# Patient Record
Sex: Female | Born: 1960
Health system: Southern US, Community
[De-identification: ages and names within clinical notes are randomized; demographics above are authoritative.]

## PROBLEM LIST (undated history)

## (undated) DIAGNOSIS — R7302 Impaired glucose tolerance (oral): Secondary | ICD-10-CM

## (undated) DIAGNOSIS — G4700 Insomnia, unspecified: Secondary | ICD-10-CM

## (undated) DIAGNOSIS — F4389 Other reactions to severe stress: Secondary | ICD-10-CM

## (undated) DIAGNOSIS — I10 Essential (primary) hypertension: Secondary | ICD-10-CM

## (undated) DIAGNOSIS — R197 Diarrhea, unspecified: Secondary | ICD-10-CM

## (undated) DIAGNOSIS — J45909 Unspecified asthma, uncomplicated: Secondary | ICD-10-CM

## (undated) DIAGNOSIS — J309 Allergic rhinitis, unspecified: Secondary | ICD-10-CM

## (undated) DIAGNOSIS — M8430XA Stress fracture, unspecified site, initial encounter for fracture: Secondary | ICD-10-CM

## (undated) DIAGNOSIS — G43009 Migraine without aura, not intractable, without status migrainosus: Secondary | ICD-10-CM

## (undated) DIAGNOSIS — M1990 Unspecified osteoarthritis, unspecified site: Secondary | ICD-10-CM

## (undated) DIAGNOSIS — F438 Other reactions to severe stress: Secondary | ICD-10-CM

## (undated) HISTORY — DX: Allergic rhinitis, unspecified: J30.9

## (undated) HISTORY — DX: Diarrhea, unspecified: R19.7

## (undated) HISTORY — DX: Migraine without aura, not intractable, without status migrainosus: G43.009

## (undated) HISTORY — DX: Other reactions to severe stress: F43.89

## (undated) HISTORY — DX: Insomnia, unspecified: G47.00

## (undated) HISTORY — DX: Unspecified asthma, uncomplicated: J45.909

## (undated) HISTORY — DX: Other reactions to severe stress: F43.8

## (undated) HISTORY — DX: Impaired glucose tolerance (oral): R73.02

---

## 2005-07-21 ENCOUNTER — Ambulatory Visit: Payer: Self-pay | Admitting: Internal Medicine

## 2006-04-03 ENCOUNTER — Ambulatory Visit: Payer: Self-pay | Admitting: Internal Medicine

## 2006-05-29 ENCOUNTER — Ambulatory Visit: Payer: Self-pay | Admitting: Internal Medicine

## 2006-09-11 ENCOUNTER — Ambulatory Visit: Payer: Self-pay | Admitting: Internal Medicine

## 2007-02-27 ENCOUNTER — Ambulatory Visit: Payer: Self-pay | Admitting: Internal Medicine

## 2007-03-04 ENCOUNTER — Encounter: Payer: Self-pay | Admitting: Internal Medicine

## 2007-03-04 DIAGNOSIS — J45909 Unspecified asthma, uncomplicated: Secondary | ICD-10-CM | POA: Insufficient documentation

## 2007-05-21 ENCOUNTER — Ambulatory Visit: Payer: Self-pay | Admitting: Internal Medicine

## 2007-05-21 DIAGNOSIS — J45901 Unspecified asthma with (acute) exacerbation: Secondary | ICD-10-CM | POA: Insufficient documentation

## 2007-05-21 DIAGNOSIS — J309 Allergic rhinitis, unspecified: Secondary | ICD-10-CM

## 2007-05-22 ENCOUNTER — Telehealth (INDEPENDENT_AMBULATORY_CARE_PROVIDER_SITE_OTHER): Payer: Self-pay | Admitting: *Deleted

## 2007-06-23 ENCOUNTER — Ambulatory Visit: Payer: Self-pay | Admitting: Family Medicine

## 2007-07-04 ENCOUNTER — Telehealth: Payer: Self-pay | Admitting: Family Medicine

## 2007-08-06 ENCOUNTER — Telehealth: Payer: Self-pay | Admitting: Family Medicine

## 2008-01-04 ENCOUNTER — Ambulatory Visit: Payer: Self-pay | Admitting: Internal Medicine

## 2008-01-04 DIAGNOSIS — J019 Acute sinusitis, unspecified: Secondary | ICD-10-CM

## 2008-01-04 DIAGNOSIS — G4700 Insomnia, unspecified: Secondary | ICD-10-CM

## 2008-01-09 ENCOUNTER — Ambulatory Visit: Payer: Self-pay | Admitting: Internal Medicine

## 2008-01-09 DIAGNOSIS — J209 Acute bronchitis, unspecified: Secondary | ICD-10-CM | POA: Insufficient documentation

## 2008-05-21 ENCOUNTER — Ambulatory Visit: Payer: Self-pay | Admitting: Internal Medicine

## 2008-07-10 ENCOUNTER — Telehealth (INDEPENDENT_AMBULATORY_CARE_PROVIDER_SITE_OTHER): Payer: Self-pay | Admitting: *Deleted

## 2008-07-30 ENCOUNTER — Telehealth (INDEPENDENT_AMBULATORY_CARE_PROVIDER_SITE_OTHER): Payer: Self-pay | Admitting: *Deleted

## 2008-11-11 ENCOUNTER — Telehealth (INDEPENDENT_AMBULATORY_CARE_PROVIDER_SITE_OTHER): Payer: Self-pay | Admitting: *Deleted

## 2008-11-14 ENCOUNTER — Telehealth: Payer: Self-pay | Admitting: Internal Medicine

## 2008-11-21 ENCOUNTER — Ambulatory Visit: Payer: Self-pay | Admitting: Internal Medicine

## 2009-02-10 ENCOUNTER — Ambulatory Visit: Payer: Self-pay | Admitting: Internal Medicine

## 2009-02-10 DIAGNOSIS — R05 Cough: Secondary | ICD-10-CM | POA: Insufficient documentation

## 2009-03-24 ENCOUNTER — Ambulatory Visit: Payer: Self-pay | Admitting: Internal Medicine

## 2009-03-24 DIAGNOSIS — R062 Wheezing: Secondary | ICD-10-CM

## 2009-04-27 ENCOUNTER — Telehealth: Payer: Self-pay | Admitting: Internal Medicine

## 2009-06-08 ENCOUNTER — Ambulatory Visit: Payer: Self-pay | Admitting: Internal Medicine

## 2009-10-19 ENCOUNTER — Telehealth: Payer: Self-pay | Admitting: Internal Medicine

## 2009-10-22 ENCOUNTER — Ambulatory Visit: Payer: Self-pay | Admitting: Internal Medicine

## 2009-11-09 ENCOUNTER — Telehealth: Payer: Self-pay | Admitting: Internal Medicine

## 2009-11-17 ENCOUNTER — Telehealth: Payer: Self-pay | Admitting: Internal Medicine

## 2010-03-01 ENCOUNTER — Ambulatory Visit: Payer: Self-pay | Admitting: Internal Medicine

## 2010-03-01 DIAGNOSIS — R197 Diarrhea, unspecified: Secondary | ICD-10-CM | POA: Insufficient documentation

## 2010-03-01 DIAGNOSIS — F411 Generalized anxiety disorder: Secondary | ICD-10-CM | POA: Insufficient documentation

## 2010-03-01 DIAGNOSIS — G43009 Migraine without aura, not intractable, without status migrainosus: Secondary | ICD-10-CM | POA: Insufficient documentation

## 2010-03-08 ENCOUNTER — Telehealth: Payer: Self-pay | Admitting: Internal Medicine

## 2010-04-26 ENCOUNTER — Telehealth: Payer: Self-pay | Admitting: Internal Medicine

## 2010-07-20 NOTE — Progress Notes (Signed)
Summary: Rx?  Phone Note Call from Patient Call back at Home Phone [phone]   Caller: Patient Summary of Call: pt called stating that Rx for Zolpiden was received by [facility] pharmacy but Rx was not signed. [facility] will contact office to verify. Pt will be going out of town tomorrow which would not give enough time for [facility] to mail Rx. Pt is requesting temporary Rx to [facility] until [facility] can mail out Rx Initial call taken by: Margaret Pyle, CMA,  Nov 17, 2009 9:34 AM  Follow-up for Phone Call        [facility] called and Rx for Zolpidem verified with Pharmacy tech Lyda Perone Follow-up by: Margaret Pyle, CMA,  Nov 17, 2009 11:54 AM    New/Updated Medications: ZOLPIDEM TARTRATE 10 MG  TABS (ZOLPIDEM TARTRATE) 1/2 or 1 by mouth at hs as needed Prescriptions: ZOLPIDEM TARTRATE 10 MG  TABS (ZOLPIDEM TARTRATE) 1/2 or 1 by mouth at hs as needed  #30 x 5   Entered and Authorized by:   Corwin Levins MD   Signed by:   Corwin Levins MD on 11/17/2009   Method used:   Print then Give to Patient   RxID:   [id number]  done hardcopy to LIM side B - dahlia  Corwin Levins MD  Nov 17, 2009 10:23 AM   Rx faxed to pharmacy Margaret Pyle, CMA  Nov 17, 2009 11:52 AM

## 2010-07-20 NOTE — Progress Notes (Signed)
Summary: Rx refill  Phone Note Refill Request   Refills Requested: Medication #1:  ZOLPIDEM TARTRATE 10 MG  TABS 1/2 or 1 by mouth at hs as needed.   Dosage confirmed as above?Dosage Confirmed   Supply Requested: 3 months pt is requestin 3 month supply with 3 refills to [facility] pharmacy  Initial call taken by: Margaret Pyle, CMA,  Nov 09, 2009 1:59 PM  Follow-up for Phone Call        Rx faxed to pharmacy Follow-up by: Margaret Pyle, CMA,  Nov 09, 2009 2:42 PM    Prescriptions: ZOLPIDEM TARTRATE 10 MG  TABS (ZOLPIDEM TARTRATE) 1/2 or 1 by mouth at hs as needed  #30 x 5   Entered and Authorized by:   Corwin Levins MD   Signed by:   Corwin Levins MD on 11/09/2009   Method used:   Print then Give to Patient   RxID:   [id number]  done hardcopy to LIM side B - dahlia  Corwin Levins MD  Nov 09, 2009 2:38 PM

## 2010-07-20 NOTE — Assessment & Plan Note (Signed)
Summary: cpx-lb   Vital Signs:  Patient profile:   50 year old female Height:      72 inches Weight:      219.25 pounds BMI:     29.84 O2 Sat:      97 % on Room air Temp:     98.1 degrees F oral Pulse rate:   66 / minute BP sitting:   132 / 74  (left arm) Cuff size:   large  Vitals Entered ByZella Ball Ewing (Oct 22, 2009 11:31 AM)  O2 Flow:  Room air CC: Adult Physical/RE   Primary Care Provider:  Corwin Levins MD  CC:  Adult Physical/RE.  History of Present Illness: overall doing well;  no complaints,  Pt denies CP, sob, doe, wheezing, orthopnea, pnd, worsening LE edema, palps, dizziness or syncope  Pt denies new neuro symptoms such as headache, facial or extremity weakness No fever, unsual wt loss, night sweats or other constittutional symtpoms.  Absolutely (as before )  refuses any type of shot or blood draw or cbg or other such as colonscopy.  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    Problems Prior to Update: 1)  Preventive Health Care  (ICD-V70.0) 2)  Wheezing  (ICD-786.07) 3)  Wheezing  (ICD-786.07) 4)  Cough  (ICD-786.2) 5)  Asthmatic Bronchitis, Acute  (ICD-466.0) 6)  Insomnia, Persistent  (ICD-307.42) 7)  Sinusitis, Acute  (ICD-461.9) 8)  Asthma, With Acute Exacerbation  (ICD-493.92) 9)  Allergic Rhinitis  (ICD-477.9) 10)  Asthma  (ICD-493.90)  Medications Prior to Update: 1)  Advair Diskus 250-50 Mcg/dose Misc (Fluticasone-Salmeterol) .... Inhale 1 Puff As Directed Twice A Day 2)  Proair Hfa 108 (90 Base) Mcg/act Aers (Albuterol Sulfate) .Marland Kitchen.. 1-2 Puffs Qid As Needed For Wheezing 3)  Zolpidem Tartrate 10 Mg  Tabs (Zolpidem Tartrate) .... 1/2 or 1 By Mouth At [facility] As Needed 4)  Prednisone 10 Mg Tabs (Prednisone) .... 4po Qd For 3days, Then 3po Qd For 3days, Then 2po Qd For 3days, Then 1po Qd For 3 Days, Then Stop 5)  Tussionex Pennkinetic Er 8-10 Mg/100ml Lqcr (Chlorpheniramine-Hydrocodone) .Marland Kitchen.. 1 Tsp By Mouth Two Times A Day  As Needed 6)  Azithromycin 250 Mg Tabs (Azithromycin) .... 2po Qd For 1 Day, Then 1po Qd For 4days, Then Stop 7)  Naproxen 500 Mg Tabs (Naproxen) .Marland Kitchen.. 1po Two Times A Day As Needed Pain  Current Medications (verified): 1)  Proair Hfa 108 (90 Base) Mcg/act Aers (Albuterol Sulfate) .Marland Kitchen.. 1-2 Puffs Qid As Needed For Wheezing 2)  Zolpidem Tartrate 10 Mg  Tabs (Zolpidem Tartrate) .... 1/2 or 1 By Mouth At [facility] As Needed  Allergies (verified): 1)  ! * Phenergan With Codeine 2)  Ceftin  Past History:  Past Medical History: Last updated: June 04, 2007 Asthma Allergic rhinitis  Past Surgical History: Last updated: 03/04/2007 Denies surgical history  Family History: Last updated: 06/04/2007 grandmother died with MI at 61 yo  Social History: Last updated: 02/10/2009 Former Smoker Alcohol use-yes Married Alcohol use-no Drug use-no Regular exercise-yes  Risk Factors: Exercise: yes (02/10/2009)  Risk Factors: Smoking Status: quit (2007-06-04)  Review of Systems  The patient denies anorexia, fever, vision loss, decreased hearing, hoarseness, chest pain, syncope, dyspnea on exertion, peripheral edema, prolonged cough, headaches, hemoptysis, abdominal pain, melena, hematochezia, severe indigestion/heartburn, hematuria, muscle weakness, suspicious skin lesions, transient blindness, difficulty walking, depression, unusual weight change, abnormal bleeding, enlarged lymph nodes, angioedema, and breast masses.  all otherwise negative per pt -    Physical Exam  General:  alert and overweight-appearing.   Head:  normocephalic and atraumatic.   Eyes:  vision grossly intact, pupils equal, and pupils round.   Ears:  R ear normal and L ear normal.   Nose:  no external deformity and no nasal discharge.   Mouth:  no gingival abnormalities and pharynx pink and moist.   Neck:  supple and no masses.   Lungs:  normal respiratory effort and normal breath sounds.   Heart:  normal rate and  regular rhythm.   Abdomen:  soft, non-tender, and normal bowel sounds.   Msk:  no joint tenderness and no joint swelling.   Extremities:  no edema, no erythema  Neurologic:  cranial nerves II-XII intact and strength normal in all extremities.   Skin:  color normal and no rashes.   Psych:  not depressed appearing and moderately anxious.     Impression & Recommendations:  Problem # 1:  Preventive Health Care (ICD-V70.0)  Overall doing well, age appropriate education and counseling updated and referral for appropriate preventive services done unless declined, immunizations up to date or declined, diet counseling done if overweight, urged to quit smoking if smokes , most recent labs reviewed and current ordered if appropriate, ecg reviewed or declined (interpretation per ECG scanned in the EMR if done); information regarding Medicare Prevention requirements given if appropriate; speciality referrals updated as appropriate   Orders: EKG w/ Interpretation (93000)  Complete Medication List: 1)  Proair Hfa 108 (90 Base) Mcg/act Aers (Albuterol sulfate) .Marland Kitchen.. 1-2 puffs qid as needed for wheezing 2)  Zolpidem Tartrate 10 Mg Tabs (Zolpidem tartrate) .... 1/2 or 1 by mouth at hs as needed  Patient Instructions: 1)  Continue all previous medications as before this visit 2)  Please schedule a follow-up appointment in 1 year or sooner if needed 3)  Please call if you would like to the blood work, or the colonscopy Prescriptions: PROAIR HFA 108 (90 BASE) MCG/ACT AERS (ALBUTEROL SULFATE) 1-2 puffs QID as needed for wheezing  #3 x 3   Entered and Authorized by:   Corwin Levins MD   Signed by:   Corwin Levins MD on 10/22/2009   Method used:   Print then Give to Patient   RxID:   [id number]

## 2010-07-20 NOTE — Progress Notes (Signed)
Summary: Rx req  Phone Note Call from Patient Call back at Home Phone [phone]   Caller: Patient Summary of Call: Pt called stating that at last OV she discuss with MD severe HA and was started on Imitrex and MD also suggested Rx for stress. Pt states that  Imitrex is causing GI upset and she would like to try Rx for Xanax and if that does not help she will like to be [facility]. Initial call taken by: Margaret Pyle, CMA,  March 08, 2010 11:11 AM  Follow-up for Phone Call        done hardcopy to LIM side B - dahlia  Follow-up by: Corwin Levins MD,  March 08, 2010 12:01 PM  Additional Follow-up for Phone Call Additional follow up Details #1::        Pt informed, Rx faxed to [facility] per pt request Additional Follow-up by: Margaret Pyle, CMA,  March 08, 2010 1:47 PM    New/Updated Medications: ALPRAZOLAM 0.5 MG TABS (ALPRAZOLAM) 1po two times a day as needed Prescriptions: ALPRAZOLAM 0.5 MG TABS (ALPRAZOLAM) 1po two times a day as needed  #60 x 1   Entered and Authorized by:   Corwin Levins MD   Signed by:   Corwin Levins MD on 03/08/2010   Method used:   Print then Give to Patient   RxID:   [id number]

## 2010-07-20 NOTE — Progress Notes (Signed)
  Phone Note Refill Request Message from:  Patient on April 26, 2010 9:04 AM  Refills Requested: Medication #1:  ZOLPIDEM TARTRATE 10 MG  TABS 1/2 or 1 by mouth at hs as needed   Dosage confirmed as above?Dosage Confirmed   Supply Requested: 6 months Pt is requesting #90 x 1 to [facility]   Method Requested: Electronic Initial call taken by: Margaret Pyle, CMA,  April 26, 2010 9:05 AM  Follow-up for Phone Call        done hardcopy to LIM side B - dahlia  Follow-up by: Corwin Levins MD,  April 26, 2010 1:12 PM  Additional Follow-up for Phone Call Additional follow up Details #1::        Rx faxed to pharmacy Additional Follow-up by: Margaret Pyle, CMA,  April 26, 2010 1:29 PM    New/Updated Medications: ZOLPIDEM TARTRATE 10 MG  TABS (ZOLPIDEM TARTRATE) 1/2 or 1 by mouth at hs as needed Prescriptions: ZOLPIDEM TARTRATE 10 MG  TABS (ZOLPIDEM TARTRATE) 1/2 or 1 by mouth at hs as needed  #90 x 1   Entered and Authorized by:   Corwin Levins MD   Signed by:   Corwin Levins MD on 04/26/2010   Method used:   Print then Give to Patient   RxID:   [id number]

## 2010-07-20 NOTE — Assessment & Plan Note (Signed)
Summary: FEVER-NO ENERGY-DIARRHEA--STC   Vital Signs:  Patient profile:   50 year old female Height:      72 inches Weight:      216 pounds BMI:     29.40 O2 Sat:      97 % on Room air Temp:     98.9 degrees F oral Pulse rate:   81 / minute BP sitting:   118 / 82  (left arm) Cuff size:   large  Vitals Entered By: Zella Ball Ewing CMA Duncan Dull) (March 01, 2010 2:56 PM)  O2 Flow:  Room air CC: Fever, diarrhea, nauseated, headaches for 1 month/RE   Primary Care Provider:  Corwin Levins MD  CC:  Fever, diarrhea, nauseated, and headaches for 1 month/RE.  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  No fever,  ST, cough, sinus pain, and Pt denies CP, worsening sob, doe, wheezing, orthopnea, pnd, worsening LE edema, palps, dizziness or syncope  Pt denies new neuro symptoms such as headache, facial or extremity weakness No fever, wt loss, night sweats, loss of appetite or other constitutional symptoms   Has ongoing stress as above, but no worsening depressive symtpoms or suicidal ideation.    also with recent low grade fever and diarrhea watery recently without blood for several days, no wt loss vomiting or pain, with some exposure to grandchildren (but not clear they were ill)  Problems Prior to Update: 1)  Diarrhea, Recurrent  (ICD-787.91) 2)  Anxiety, Situational  (ICD-308.3) 3)  Common Migraine  (ICD-346.10) 4)  Preventive Health Care  (ICD-V70.0) 5)  Wheezing  (ICD-786.07) 6)  Wheezing  (ICD-786.07) 7)  Cough  (ICD-786.2) 8)  Asthmatic Bronchitis, Acute   (ICD-466.0) 9)  Insomnia, Persistent  (ICD-307.42) 10)  Sinusitis, Acute  (ICD-461.9) 11)  Asthma, With Acute Exacerbation  (ICD-493.92) 12)  Allergic Rhinitis  (ICD-477.9) 13)  Asthma  (ICD-493.90)  Medications Prior to Update: 1)  Proair Hfa 108 (90 Base) Mcg/act Aers (Albuterol Sulfate) .Marland Kitchen.. 1-2 Puffs Qid As Needed For Wheezing 2)  Zolpidem Tartrate 10 Mg  Tabs (Zolpidem Tartrate) .... 1/2 or 1 By Mouth At [facility] As Needed  Current Medications (verified): 1)  Proair Hfa 108 (90 Base) Mcg/act Aers (Albuterol Sulfate) .Marland Kitchen.. 1-2 Puffs Qid As Needed For Wheezing 2)  Zolpidem Tartrate 10 Mg  Tabs (Zolpidem Tartrate) .... 1/2 or 1 By Mouth At [facility] As Needed 3)  Sumatriptan Succinate 100 Mg Tabs (Sumatriptan Succinate) .Marland Kitchen.. 1 By Mouth Every Other Day As Needed  Allergies (verified): 1)  ! * Phenergan With Codeine 2)  Ceftin  Past History:  Past Medical History: Last updated: 05/21/2007 Asthma Allergic rhinitis  Past Surgical History: Last updated: 03/04/2007 Denies surgical history  Social History: Last updated: 02/10/2009 Former Smoker Alcohol use-yes Married Alcohol use-no Drug use-no Regular exercise-yes  Risk Factors: Exercise: yes (02/10/2009)  Risk Factors: Smoking Status: quit (05/21/2007)  Review of Systems       all otherwise negative per pt -    Physical Exam  General:  alert and  overweight-appearing.  , not ill appearing Head:  normocephalic and atraumatic.   Eyes:  vision grossly intact, pupils equal, and pupils round.   Ears:  R ear normal and L ear normal.   Nose:  no external deformity and no nasal discharge.   Mouth:  no gingival abnormalities and pharynx pink and moist.   Neck:  supple and no masses.   Lungs:  normal respiratory effort and normal breath sounds.   Heart:  normal rate and regular rhythm.   Abdomen:  soft, non-tender, and normal bowel sounds.   Extremities:  no edema, no erythema  Skin:  no rashes.     Impression &  Recommendations:  Problem # 1:  COMMON MIGRAINE (ICD-346.10)  Her updated medication list for this problem includes:    Sumatriptan Succinate 100 Mg Tabs (Sumatriptan succinate) .Marland Kitchen... 1 by mouth every other day as needed treat as above, f/u any worsening signs or symptoms   Problem # 2:  ANXIETY, SITUATIONAL (ICD-308.3) due to work stress - for xanax as needed  but pt declines today  Problem # 3:  DIARRHEA, RECURRENT (ICD-787.91) ? viral with exposure to grandkids, vs stress vs other - exam benign, ok for immodium , consider metamucil as needed   Complete Medication List: 1)  Proair Hfa 108 (90 Base) Mcg/act Aers (Albuterol sulfate) .Marland Kitchen.. 1-2 puffs qid as needed for wheezing 2)  Zolpidem Tartrate 10 Mg Tabs (Zolpidem tartrate) .... 1/2 or 1 by mouth at hs as needed 3)  Sumatriptan Succinate 100 Mg Tabs (Sumatriptan succinate) .Marland Kitchen.. 1 by mouth every other day as needed  Patient Instructions: 1)  Please take all new medications as prescribed  - the generic imitrex  for the worse headache 2)  you can also take excedrin migraine for the milder headaches 3)  if the headaches persist, call for referral to [facility] in 1 -2 wks 4)  Continue all previous medications as before this visit  5)  Please schedule a follow-up appointment in May 2012 iwth CPX labs Prescriptions: SUMATRIPTAN SUCCINATE 100 MG TABS (SUMATRIPTAN SUCCINATE) 1 by mouth every other day as needed  #9 x 0   Entered and Authorized by:   Corwin Levins MD   Signed by:   Corwin Levins MD on 03/01/2010   Method used:   Print then Give to Patient   RxID:   [id number]

## 2010-07-20 NOTE — Progress Notes (Signed)
Summary: Rx refill  Phone Note Call from Patient Call back at Home Phone [phone]   Caller: Patient Summary of Call: pt called requesting #30 day Zolpidem until CPX 05/05 to local pharmacy. Okay to fill? Initial call taken by: Margaret Pyle, CMA,  Oct 19, 2009 12:58 PM  Follow-up for Phone Call        done hardcopy to LIM side B - dahlia  Follow-up by: Corwin Levins MD,  Oct 19, 2009 1:15 PM  Additional Follow-up for Phone Call Additional follow up Details #1::        Rx faxed to pharmacy per pt request Additional Follow-up by: Margaret Pyle, CMA,  Oct 19, 2009 1:21 PM    Prescriptions: ZOLPIDEM TARTRATE 10 MG  TABS (ZOLPIDEM TARTRATE) 1/2 or 1 by mouth at hs as needed  #30 x 0   Entered and Authorized by:   Corwin Levins MD   Signed by:   Corwin Levins MD on 10/19/2009   Method used:   Print then Give to Patient   RxID:   [id number]

## 2010-07-29 ENCOUNTER — Telehealth: Payer: Self-pay | Admitting: Internal Medicine

## 2010-08-05 NOTE — Progress Notes (Signed)
Summary: Rx refill req  Phone Note Refill Request Message from:  Patient on July 29, 2010 9:22 AM  Refills Requested: Medication #1:  PROAIR HFA 108 (90 BASE) MCG/ACT AERS 1-2 puffs QID as needed for wheezing  Medication #2:  ALPRAZOLAM 0.5 MG TABS 1po two times a day as needed.  Method Requested: Electronic Initial call taken by: Margaret Pyle, CMA,  July 29, 2010 9:23 AM  Follow-up for Phone Call        proair done escript  xanax done hardcopy to LIM side B - dahlia  Follow-up by: Corwin Levins MD,  July 29, 2010 9:26 AM  Additional Follow-up for Phone Call Additional follow up Details #1::        Xanax Rx faxed to pharmacy  Additional Follow-up by: Margaret Pyle, CMA,  July 29, 2010 9:42 AM    New/Updated Medications: ALPRAZOLAM 0.5 MG TABS (ALPRAZOLAM) 1po two times a day as needed Prescriptions: ALPRAZOLAM 0.5 MG TABS (ALPRAZOLAM) 1po two times a day as needed  #60 x 1   Entered and Authorized by:   Corwin Levins MD   Signed by:   Corwin Levins MD on 07/29/2010   Method used:   Print then Give to Patient   RxID:   [id number] PROAIR HFA 108 (90 BASE) MCG/ACT AERS (ALBUTEROL SULFATE) 1-2 puffs QID as needed for wheezing  #3 x 1   Entered and Authorized by:   Corwin Levins MD   Signed by:   Corwin Levins MD on 07/29/2010   Method used:   Electronically to        [facility] [id number] (retail)       [address] [place], [place]  [address]       Ph: [phone]       Fax: [phone]   RxID:   [id number]

## 2010-10-18 ENCOUNTER — Other Ambulatory Visit: Payer: Self-pay

## 2010-10-18 MED ORDER — ZOLPIDEM TARTRATE 10 MG PO TABS: 10.0000 mg | ORAL_TABLET | Freq: Every evening | ORAL | Status: DC | PRN

## 2010-10-18 NOTE — Telephone Encounter (Signed)
Pt called requesting refill of Zolpidem to [facility]. Last OV 02/2010, CPX due 11/2010.

## 2010-10-19 ENCOUNTER — Other Ambulatory Visit: Payer: Self-pay

## 2010-10-19 MED ORDER — ZOLPIDEM TARTRATE 10 MG PO TABS: 10.0000 mg | ORAL_TABLET | Freq: Every evening | ORAL | Status: DC | PRN

## 2010-10-19 NOTE — Telephone Encounter (Signed)
Rx faxed to pharmacy  

## 2010-10-19 NOTE — Telephone Encounter (Signed)
Done hardcopy to dahlia/LIM B  

## 2010-10-19 NOTE — Telephone Encounter (Signed)
[facility] requesting refill on Zolpidem 10 mg last refill 04/26/10 #90 with 1 refill and last OV 9/12 /11

## 2010-10-19 NOTE — Telephone Encounter (Signed)
Already done, see previous note

## 2010-11-25 ENCOUNTER — Other Ambulatory Visit: Payer: Self-pay

## 2010-11-25 MED ORDER — ALPRAZOLAM 1 MG PO TABS: 1.0000 mg | ORAL_TABLET | Freq: Two times a day (BID) | ORAL | Status: DC | PRN

## 2010-11-25 NOTE — Telephone Encounter (Signed)
Pt called requesting refill of medication, last written to be filled 02-02/2011 #60 x 1. Last OV 03/01/2010

## 2010-11-25 NOTE — Telephone Encounter (Signed)
Rx faxed to pharmacy  

## 2011-01-06 ENCOUNTER — Ambulatory Visit (INDEPENDENT_AMBULATORY_CARE_PROVIDER_SITE_OTHER): Payer: Managed Care, Other (non HMO) | Admitting: Internal Medicine

## 2011-01-06 ENCOUNTER — Encounter: Payer: Self-pay | Admitting: Internal Medicine

## 2011-01-06 DIAGNOSIS — Z0000 Encounter for general adult medical examination without abnormal findings: Secondary | ICD-10-CM

## 2011-01-06 DIAGNOSIS — J45909 Unspecified asthma, uncomplicated: Secondary | ICD-10-CM

## 2011-01-06 DIAGNOSIS — J019 Acute sinusitis, unspecified: Secondary | ICD-10-CM

## 2011-01-06 DIAGNOSIS — G4700 Insomnia, unspecified: Secondary | ICD-10-CM

## 2011-01-06 DIAGNOSIS — F438 Other reactions to severe stress: Secondary | ICD-10-CM

## 2011-01-06 DIAGNOSIS — R062 Wheezing: Secondary | ICD-10-CM

## 2011-01-06 MED ORDER — LEVOFLOXACIN 500 MG PO TABS: 500.0000 mg | ORAL_TABLET | Freq: Every day | ORAL | Status: AC

## 2011-01-06 MED ORDER — PREDNISONE 10 MG PO TABS: 10.0000 mg | ORAL_TABLET | Freq: Every day | ORAL | Status: DC

## 2011-01-06 MED ORDER — HYDROCODONE-HOMATROPINE 5-1.5 MG/5ML PO SYRP: 5.0000 mL | ORAL_SOLUTION | Freq: Four times a day (QID) | ORAL | Status: DC | PRN

## 2011-01-06 NOTE — Assessment & Plan Note (Signed)
Mild to mod, for antibx course,  to f/u any worsening symptoms or concerns 

## 2011-01-06 NOTE — Assessment & Plan Note (Signed)
Mild, declines depomedrol IM, for predpack for home,  to f/u any worsening symptoms or concerns

## 2011-01-06 NOTE — Assessment & Plan Note (Signed)
stable overall by hx and exam, and pt to continue medical treatment as before 

## 2011-01-06 NOTE — Assessment & Plan Note (Signed)
stable overall by hx and exam, most recent data reviewed with pt, and pt to continue medical treatment as before  SpO2 Readings from Last 3 Encounters:  01/06/11 96%  03/01/10 97%  10/22/09 97%

## 2011-01-06 NOTE — Progress Notes (Signed)
  Subjective:    Patient ID: Terri Fletcher, female    DOB: 19-Mar-1961, 50 y.o.   MRN: [id number]  HPI  Here with 3 days acute onset fever, facial pain, pressure, general weakness and malaise, and greenish d/c, with slight ST, but little to no cough and Pt denies chest pain, increased sob or doe,orthopnea, PND, increased LE swelling, palpitations, dizziness or syncope, but has used her inhaler several times since yesterday due to increased wheezing.  Pt denies new neurological symptoms such as new headache, or facial or extremity weakness or numbness   Pt denies polydipsia, polyuria.  Overall good compliance with treatment, and good medicine tolerability. Denies worsening depressive symptoms, suicidal ideation, or panic, though has ongoing anxiety, not increased recently.   Asthma has been well controlled until onset acute symptoms with rare use of inhaler, no nighttime awakenings or signficiant cough, sob Past Medical History  Diagnosis Date  . ALLERGIC RHINITIS 05/21/2007  . ANXIETY, SITUATIONAL 03/01/2010  . ASTHMA, WITH ACUTE EXACERBATION 05/21/2007  . ASTHMATIC BRONCHITIS, ACUTE 01/09/2008  . ASTHMA 03/04/2007  . COMMON MIGRAINE 03/01/2010  . Cough 02/10/2009  . DIARRHEA, RECURRENT 03/01/2010  . INSOMNIA, PERSISTENT 01/04/2008  . SINUSITIS, ACUTE 01/04/2008  . Wheezing 03/24/2009   No past surgical history on file.  reports that she has quit smoking. She does not have any smokeless tobacco history on file. She reports that she does not drink alcohol or use illicit drugs. family history is not on file. Allergies  Allergen Reactions  . Cefuroxime Axetil     REACTION: nausea   Current Outpatient Prescriptions on File Prior to Visit  Medication Sig Dispense Refill  . zolpidem (AMBIEN) 10 MG tablet Take 1 tablet (10 mg total) by mouth at bedtime as needed for sleep.  90 tablet  1   Review of Systems Review of Systems  Constitutional: Negative for diaphoresis and unexpected weight change.  HENT:  Negative for drooling and tinnitus.   Eyes: Negative for photophobia and visual disturbance.  Respiratory: Negative for choking and stridor.     Objective:   Physical Exam BP 130/88  Pulse 85  Temp(Src) 98.2 F (36.8 C) (Oral)  Ht 6' (1.829 m)  Wt 225 lb 4 oz (102.173 kg)  BMI 30.55 kg/m2  SpO2 96% Physical Exam  VS noted, obese, mild ill appearing Constitutional: Pt appears well-developed and well-nourished.  HENT: Head: Normocephalic.  Right Ear: External ear normal.  Left Ear: External ear normal.  Bilat tm's mild erythema.  Sinus tender bilat.  Pharynx mild erythema Eyes: Conjunctivae and EOM are normal. Pupils are equal, round, and reactive to light.  Neck: Normal range of motion. Neck supple.  Cardiovascular: Normal rate and regular rhythm.   Pulmonary/Chest: Effort normal and breath sounds decreased bilat with wheeze insp/exp.  Neurological: Pt is alert. No cranial nerve deficit.  Skin: Skin is warm. No erythema.  Psychiatric: Pt behavior is normal. Thought content normal. 1+ nervous        Assessment & Plan:

## 2011-01-06 NOTE — Patient Instructions (Addendum)
Take all new medications as prescribed Continue all other medications as before Please return in 3 mo with Lab testing done 3-5 days before  

## 2011-01-12 ENCOUNTER — Ambulatory Visit (INDEPENDENT_AMBULATORY_CARE_PROVIDER_SITE_OTHER): Payer: Managed Care, Other (non HMO) | Admitting: Internal Medicine

## 2011-01-12 ENCOUNTER — Encounter: Payer: Self-pay | Admitting: Internal Medicine

## 2011-01-12 VITALS — BP 138/92 | HR 76 | Temp 97.8°F | Ht 72.0 in

## 2011-01-12 DIAGNOSIS — J45909 Unspecified asthma, uncomplicated: Secondary | ICD-10-CM

## 2011-01-12 DIAGNOSIS — J45901 Unspecified asthma with (acute) exacerbation: Secondary | ICD-10-CM

## 2011-01-12 DIAGNOSIS — R062 Wheezing: Secondary | ICD-10-CM

## 2011-01-12 MED ORDER — FLUTICASONE-SALMETEROL 250-50 MCG/DOSE IN AEPB: 1.0000 | INHALATION_SPRAY | Freq: Two times a day (BID) | RESPIRATORY_TRACT | Status: DC

## 2011-01-12 MED ORDER — CHLORPHENIRAMINE-HYDROCODONE 8-10 MG/5ML PO LQCR: 5.0000 mL | Freq: Two times a day (BID) | ORAL | Status: DC | PRN

## 2011-01-12 MED ORDER — ALBUTEROL SULFATE 1.25 MG/3ML IN NEBU: 1.0000 | INHALATION_SOLUTION | Freq: Four times a day (QID) | RESPIRATORY_TRACT | Status: DC | PRN

## 2011-01-12 MED ORDER — PREDNISONE 10 MG PO TABS: 10.0000 mg | ORAL_TABLET | Freq: Every day | ORAL | Status: AC

## 2011-01-12 NOTE — Progress Notes (Signed)
  Subjective:    Patient ID: Terri Fletcher, female    DOB: Jul 17, 1960, 50 y.o.   MRN: [id number]  HPI  Here for continued cough -  Seen last week by PCP for same - improved head and chest congestion on levaquin antibiotics, resolved face pain Cough not improved with hycodan, pred pak or ALb MDI - min sputum production  Past Medical History  Diagnosis Date  . ALLERGIC RHINITIS 05/21/2007  . ANXIETY, SITUATIONAL 03/01/2010  . ASTHMA, WITH ACUTE EXACERBATION 05/21/2007  . ASTHMATIC BRONCHITIS, ACUTE 01/09/2008  . ASTHMA 03/04/2007  . COMMON MIGRAINE 03/01/2010  . Cough 02/10/2009  . DIARRHEA, RECURRENT 03/01/2010  . INSOMNIA, PERSISTENT 01/04/2008  . SINUSITIS, ACUTE 01/04/2008  . Wheezing 03/24/2009    Review of Systems  Constitutional: Negative for fever.  Respiratory: Positive for wheezing.   Neurological: Negative for headaches.       Objective:   Physical Exam BP 138/92  Pulse 76  Temp(Src) 97.8 F (36.6 C) (Oral)  Ht 6' (1.829 m)  SpO2 94% Constitutional: She is oriented to person, place, and time. She appears well-developed and well-nourished. No distress.  HENT: Head: Normocephalic and atraumatic. Ears; B TMs ok, no erythema or effusion; Nose: Nose normal.  Mouth/Throat: Oropharynx is clear and moist. No oropharyngeal exudate.  Eyes: Conjunctivae and EOM are normal. Pupils are equal, round, and reactive to light. No scleral icterus.  Neck: Normal range of motion. Neck supple. No JVD present. No thyromegaly present.  Cardiovascular: Normal rate, regular rhythm and normal heart sounds.  No murmur heard. No BLE edema. Pulmonary/Chest: Effort normal; coarse breath sounds bilaterally without respiratory distress. She has exp wheezes.  Neurological: She is alert and oriented to person, place, and time. No cranial nerve deficit. Coordination normal.  Psychiatric: She has a normal mood and affect. Her behavior is normal. Judgment and thought content normal.   No results found for  this basename: WBC, HGB, HCT, PLT, CHOL, TRIG, HDL, LDLDIRECT, ALT, AST, NA, K, CL, CREATININE, BUN, CO2, TSH, PSA, INR, GLUF, HGBA1C, MICROALBUR        Assessment & Plan:  Asthmatic bronchitis - triggered by sinusitus - improved symptoms on levaquin Change hycodan to tussionex, restart pred dose pak (declines IM medrol shot today) Start daily inhaled Bagonist/steroid and refill allb neb for home neb machine - no additional abx

## 2011-01-12 NOTE — Patient Instructions (Signed)
It was good to see you today. Treat the cough by treating asthma as well as symptoms: stop hycodan and use tussionex syrup, restart pred pak (in place of steroid shot today), start Advair for asthma and refill albuterol "juice" for nebulizer machine - Your prescription(s) have been submitted to your pharmacy. Please take as directed and contact our office if you believe you are having problem(s) with the medication(s). If you develop worsening symptoms or fever, call and we can reconsider additional antibiotics, but it does not appear necessary to re prescribe antibiotics at this time.

## 2011-01-13 ENCOUNTER — Ambulatory Visit: Payer: Managed Care, Other (non HMO) | Admitting: Internal Medicine

## 2011-02-14 ENCOUNTER — Other Ambulatory Visit: Payer: Self-pay

## 2011-02-14 MED ORDER — ALPRAZOLAM 0.5 MG PO TABS: 0.5000 mg | ORAL_TABLET | Freq: Two times a day (BID) | ORAL | Status: DC | PRN

## 2011-02-15 NOTE — Telephone Encounter (Signed)
Rx faxed to pharmacy  

## 2011-03-31 ENCOUNTER — Other Ambulatory Visit: Payer: Self-pay

## 2011-03-31 MED ORDER — ZOLPIDEM TARTRATE 10 MG PO TABS: 10.0000 mg | ORAL_TABLET | Freq: Every evening | ORAL | Status: DC | PRN

## 2011-03-31 NOTE — Telephone Encounter (Signed)
Done hardcopy to robin  

## 2011-03-31 NOTE — Telephone Encounter (Signed)
Faxed hardcopy to [facility] [phone]

## 2011-04-04 ENCOUNTER — Telehealth: Payer: Self-pay | Admitting: *Deleted

## 2011-04-04 NOTE — Telephone Encounter (Signed)
Please see last RF request. It needs to go to [facility], NOT [facility]. Pt left VM stating that [facility] had not received request. Please help get this adjusted for patient.

## 2011-04-05 MED ORDER — ZOLPIDEM TARTRATE 10 MG PO TABS: 10.0000 mg | ORAL_TABLET | Freq: Every evening | ORAL | Status: DC | PRN

## 2011-04-05 NOTE — Telephone Encounter (Signed)
Robin Ewing out of office until 04/11/2011. Rx sent to [facility], pt informed.

## 2011-04-18 ENCOUNTER — Encounter: Payer: Self-pay | Admitting: Internal Medicine

## 2011-04-18 ENCOUNTER — Ambulatory Visit (INDEPENDENT_AMBULATORY_CARE_PROVIDER_SITE_OTHER): Payer: Managed Care, Other (non HMO) | Admitting: Internal Medicine

## 2011-04-18 VITALS — BP 120/82 | HR 80 | Temp 97.0°F

## 2011-04-18 DIAGNOSIS — J45909 Unspecified asthma, uncomplicated: Secondary | ICD-10-CM

## 2011-04-18 DIAGNOSIS — J45901 Unspecified asthma with (acute) exacerbation: Secondary | ICD-10-CM

## 2011-04-18 MED ORDER — CHLORPHENIRAMINE-HYDROCODONE 8-10 MG/5ML PO LQCR: 5.0000 mL | Freq: Two times a day (BID) | ORAL | Status: DC | PRN

## 2011-04-18 MED ORDER — AZITHROMYCIN 250 MG PO TABS: ORAL_TABLET | ORAL | Status: AC

## 2011-04-18 MED ORDER — PREDNISONE (PAK) 10 MG PO TABS: 10.0000 mg | ORAL_TABLET | ORAL | Status: AC

## 2011-04-18 NOTE — Progress Notes (Signed)
  Subjective:    Patient ID: Terri Fletcher, female    DOB: August 28, 1960, 50 y.o.   MRN: [id number]  Cough Associated symptoms include wheezing. Pertinent negatives include no fever or headaches.    Here for cough -  complains of head and chest congestion  Precipitated by sick contact with spouse who has same symptoms - on abx since last week Cough not improved with Alb MDI or OTC meds-  associated with yellow sputum production  Past Medical History  Diagnosis Date  . ALLERGIC RHINITIS   . ANXIETY, SITUATIONAL   . ASTHMA   . COMMON MIGRAINE   . DIARRHEA, RECURRENT   . INSOMNIA, PERSISTENT     Review of Systems  Constitutional: Negative for fever.  Respiratory: Positive for cough and wheezing.   Neurological: Negative for headaches.       Objective:   Physical Exam  BP 120/82  Pulse 80  Temp(Src) 97 F (36.1 C) (Oral)  SpO2 98% Constitutional: She is coughing (deep barking) but otherwise well-developed and well-nourished.  HENT: Head: Normocephalic and atraumatic. Ears; B TMs ok, no erythema or effusion; Nose: Nose normal.  Mouth/Throat: Oropharynx is clear and moist. No oropharyngeal exudate.  Eyes: Conjunctivae and EOM are normal. Pupils are equal, round, and reactive to light. No scleral icterus.  Neck: Normal range of motion. Neck supple. No JVD present. No thyromegaly present.  Cardiovascular: Normal rate, regular rhythm and normal heart sounds.  No murmur heard. No BLE edema. Pulmonary/Chest: Effort normal; coarse breath sounds bilaterally without respiratory distress. She has exp wheezes.  Neurological: She is alert and oriented to person, place, and time. No cranial nerve deficit. Coordination normal.  Psychiatric: She has a normal mood and affect. Her behavior is normal. Judgment and thought content normal.   No results found for this basename: WBC,  HGB,  HCT,  PLT,  CHOL,  TRIG,  HDL,  LDLDIRECT,  ALT,  AST,  NA,  K,  CL,  CREATININE,  BUN,  CO2,  TSH,  PSA,   INR,  GLUF,  HGBA1C,  MICROALBUR        Assessment & Plan:  Asthmatic bronchitis, acute - triggered by URI/sick contact at home tussionex, pred dose pak (declines IM medrol shot today) and Zpak antibiotics -  Start daily inhaled Bagonist/steroid and refill allb neb for home neb machine - no additional abx

## 2011-04-18 NOTE — Patient Instructions (Signed)
It was good to see you today. You have asthmatic bronchitis - use tussionex syrup for cough, pred pak (in place of steroid shot today), and Zpak antibiotics - Your prescription(s) have been submitted to your pharmacy. Please take as directed and contact our office if you believe you are having problem(s) with the medication(s). continue Advair and albuterol for asthma

## 2011-05-23 ENCOUNTER — Telehealth: Payer: Self-pay

## 2011-05-23 MED ORDER — CIPROFLOXACIN HCL 500 MG PO TABS: 500.0000 mg | ORAL_TABLET | Freq: Two times a day (BID) | ORAL | Status: DC

## 2011-05-23 NOTE — Telephone Encounter (Signed)
Called the patient left message to call back 

## 2011-05-23 NOTE — Telephone Encounter (Signed)
Ok for tx this time, but let pt know we will need to see in OV if not imroved starting in 1-2 days on antibx

## 2011-05-23 NOTE — Telephone Encounter (Signed)
Pt called stating she has sxs of a UTI, frequency, urgency and mild odor. UA order was in EMR from July that pt did not do. Pt was advised to come into the labs for UA. Pt is unable to make OV because she has been working extended hours due to holiday season. Is this okay?

## 2011-05-23 NOTE — Telephone Encounter (Signed)
Called the patient left message of MD's instructions.

## 2011-05-30 ENCOUNTER — Ambulatory Visit (INDEPENDENT_AMBULATORY_CARE_PROVIDER_SITE_OTHER): Payer: Managed Care, Other (non HMO) | Admitting: Internal Medicine

## 2011-05-30 ENCOUNTER — Encounter: Payer: Self-pay | Admitting: Internal Medicine

## 2011-05-30 VITALS — BP 138/80 | HR 101 | Temp 99.1°F | Ht 72.0 in | Wt 219.0 lb

## 2011-05-30 DIAGNOSIS — R062 Wheezing: Secondary | ICD-10-CM

## 2011-05-30 DIAGNOSIS — F438 Other reactions to severe stress: Secondary | ICD-10-CM

## 2011-05-30 DIAGNOSIS — Z0000 Encounter for general adult medical examination without abnormal findings: Secondary | ICD-10-CM | POA: Insufficient documentation

## 2011-05-30 DIAGNOSIS — B9789 Other viral agents as the cause of diseases classified elsewhere: Secondary | ICD-10-CM

## 2011-05-30 DIAGNOSIS — B349 Viral infection, unspecified: Secondary | ICD-10-CM

## 2011-05-30 DIAGNOSIS — Z0001 Encounter for general adult medical examination with abnormal findings: Secondary | ICD-10-CM | POA: Insufficient documentation

## 2011-05-30 MED ORDER — ALBUTEROL SULFATE HFA 108 (90 BASE) MCG/ACT IN AERS: 2.0000 | INHALATION_SPRAY | Freq: Four times a day (QID) | RESPIRATORY_TRACT | Status: DC

## 2011-05-30 MED ORDER — OSELTAMIVIR PHOSPHATE 75 MG PO CAPS: 75.0000 mg | ORAL_CAPSULE | Freq: Two times a day (BID) | ORAL | Status: AC

## 2011-05-30 MED ORDER — HYDROCOD POLST-CHLORPHEN POLST 10-8 MG/5ML PO LQCR: 5.0000 mL | Freq: Two times a day (BID) | ORAL | Status: DC

## 2011-05-30 MED ORDER — PREDNISONE 10 MG PO TABS: 10.0000 mg | ORAL_TABLET | Freq: Every day | ORAL | Status: DC

## 2011-05-30 NOTE — Patient Instructions (Signed)
Take all new medications as prescribed Continue all other medications as before  

## 2011-05-30 NOTE — Assessment & Plan Note (Signed)
Rather significant and inhibits her care, again declines tx such as ssri

## 2011-05-30 NOTE — Assessment & Plan Note (Signed)
C/w pulm manifestation of current illness, husbnad with similar on predpack, she is tx with this as well;  Declines to check cbg prior in the office today to r/o uncontrolled sugar before, and declines depomedrol IM as well;  For cough med, and inhaler refilled as well,  to f/u any worsening symptoms or concerns

## 2011-05-30 NOTE — Progress Notes (Signed)
Subjective:    Patient ID: Terri Fletcher, female    DOB: 1961/06/07, 50 y.o.   MRN: [id number]  HPI  Here for acute onset flu like symptoms after husband went to urgent care with swab + for influenza A.  She now has symptoms similar with sinus pressure, fever, general weakness and malaise, crampy abd pain and loose stools, as well as new onset worsening nonprod cough with mild sob/doe.  Pt denies chest pain, increased sob or doe, wheezing, orthopnea, PND, increased LE swelling, palpitations, dizziness or syncope other than above.  Pt denies new neurological symptoms such as new headache, or facial or extremity weakness or numbness   Pt denies polydipsia, polyuria.  No other new complaints.  Has long hx of accepting only oral meds and will allow no blood draws or shots for tx or other.  Also adamant she declines CBG in the office as well. Past Medical History  Diagnosis Date  . ALLERGIC RHINITIS   . ANXIETY, SITUATIONAL   . ASTHMA   . COMMON MIGRAINE   . DIARRHEA, RECURRENT   . INSOMNIA, PERSISTENT    No past surgical history on file.  reports that she has quit smoking. She does not have any smokeless tobacco history on file. She reports that she does not drink alcohol or use illicit drugs. family history is not on file. Allergies  Allergen Reactions  . Cefuroxime Axetil     REACTION: nausea   Current Outpatient Prescriptions on File Prior to Visit  Medication Sig Dispense Refill  . albuterol (ACCUNEB) 1.25 MG/3ML nebulizer solution Take 3 mLs (1.25 mg total) by nebulization every 6 (six) hours as needed for wheezing.  75 mL  0  . ALPRAZolam (XANAX) 0.5 MG tablet Take 1 tablet (0.5 mg total) by mouth 2 (two) times daily as needed.  60 tablet  2  . Fluticasone-Salmeterol (ADVAIR DISKUS) 250-50 MCG/DOSE AEPB Inhale 1 puff into the lungs 2 (two) times daily.  60 each  0  . zolpidem (AMBIEN) 10 MG tablet Take 1 tablet (10 mg total) by mouth at bedtime as needed for sleep.  90 tablet  1  .  DISCONTD: albuterol (PROAIR HFA) 108 (90 BASE) MCG/ACT inhaler Inhale 2 puffs into the lungs 4 (four) times daily. For wheezing       . ALPRAZolam (XANAX) 1 MG tablet Take 1 tablet (1 mg total) by mouth 2 (two) times daily as needed for sleep.  60 tablet  1   Review of Systems Review of Systems  Constitutional: Negative for diaphoresis and unexpected weight change.  HENT: Negative for drooling and tinnitus.   Eyes: Negative for photophobia and visual disturbance.  Respiratory: Negative for choking and stridor.   Gastrointestinal: Negative for vomiting and blood in stool.  Genitourinary: Negative for hematuria and decreased urine volume.     Objective:   Physical Exam BP 138/80  Pulse 101  Temp(Src) 99.1 F (37.3 C) (Oral)  Ht 6' (1.829 m)  Wt 219 lb (99.338 kg)  BMI 29.70 kg/m2  SpO2 98% Physical Exam  VS noted, mild ill Constitutional: Pt appears well-developed and well-nourished.  HENT: Head: Normocephalic.  Right Ear: External ear normal.  normal.  Abd:  Soft,  non-distended, + BS, diffuse mild tender Left Ear: External ear normal.  Bilat tm's mild erythema.  Sinus nontender.  Pharynx mild erythema Eyes: Conjunctivae and EOM are normal. Pupils are equal, round, and reactive to light.  Neck: Normal range of motion. Neck supple.  Cardiovascular: Normal  rate and regular rhythm.   Pulmonary/Chest: Effort normal and breath mild decreased, with bilat few wheezes, no rales Neurological: Pt is alert. No cranial nerve deficit.  Skin: Skin is warm. No erythema.  Psychiatric: Pt behavior is normal. Thought content normal. 1+ nervous, somewhat "angry" in demeanor today for which she apologizes, 1+ nervous, not depressed appaering    Assessment & Plan:

## 2011-05-30 NOTE — Assessment & Plan Note (Signed)
C/w illness likely contracted from contact with husband with proven influenza A;  For tamiflu course,  to f/u any worsening symptoms or concerns, drink more fluids

## 2011-07-06 ENCOUNTER — Other Ambulatory Visit: Payer: Self-pay | Admitting: *Deleted

## 2011-07-06 MED ORDER — ALPRAZOLAM 0.5 MG PO TABS: 0.5000 mg | ORAL_TABLET | Freq: Two times a day (BID) | ORAL | Status: DC | PRN

## 2011-07-06 NOTE — Telephone Encounter (Signed)
Faxed hardcopy to pharmacy. 

## 2011-07-06 NOTE — Telephone Encounter (Signed)
Request for Alprazolam to [facility] refill 08.27.12 #[id number]].

## 2011-07-06 NOTE — Telephone Encounter (Signed)
Done hardcopy to robin  

## 2011-11-01 ENCOUNTER — Other Ambulatory Visit: Payer: Self-pay

## 2011-11-01 MED ORDER — ZOLPIDEM TARTRATE 10 MG PO TABS: 10.0000 mg | ORAL_TABLET | Freq: Every evening | ORAL | Status: DC | PRN

## 2011-11-01 NOTE — Telephone Encounter (Signed)
Pt called requesting refill of Zolpidem to [facility].

## 2011-11-01 NOTE — Telephone Encounter (Signed)
Faxed hardcopy to pharmacy. 

## 2011-11-01 NOTE — Telephone Encounter (Signed)
Done hardcopy to robin  

## 2011-11-11 ENCOUNTER — Other Ambulatory Visit: Payer: Self-pay

## 2011-11-11 NOTE — Telephone Encounter (Signed)
Please advise on this refill for a JWJ pt, thanks!

## 2011-11-16 MED ORDER — ALPRAZOLAM 0.5 MG PO TABS: 0.5000 mg | ORAL_TABLET | Freq: Two times a day (BID) | ORAL | Status: DC | PRN

## 2011-11-16 NOTE — Telephone Encounter (Signed)
Done hardcopy to robin  

## 2011-11-16 NOTE — Telephone Encounter (Signed)
Faxed hardcopy to pharmacy. 

## 2012-03-23 ENCOUNTER — Other Ambulatory Visit: Payer: Self-pay | Admitting: Internal Medicine

## 2012-03-23 DIAGNOSIS — Z0000 Encounter for general adult medical examination without abnormal findings: Secondary | ICD-10-CM

## 2012-03-23 MED ORDER — ALPRAZOLAM 0.5 MG PO TABS: 0.5000 mg | ORAL_TABLET | Freq: Two times a day (BID) | ORAL | Status: DC | PRN

## 2012-03-23 NOTE — Telephone Encounter (Signed)
Faxed hardcopy to pharmacy and informed pt. As well of MD instructions on appt.  She agreed to call back and schedule when more convenient

## 2012-03-23 NOTE — Telephone Encounter (Signed)
Caller: [name]; Patient Name: Terri Fletcher; PCP: Oliver Barre (Adults only); Best Callback Phone Number: [phone]; Call regarding: Medication Refill for Alprazolam; says she called the pharmacy to send over a request; informed her that last appt was 05/30/11 and an appt may be necessary prior to a refill being given; please refill or call her back if an appt is required prior to a rx refill

## 2012-03-23 NOTE — Telephone Encounter (Signed)
Done hardcopy to robin  Needs ROV with labs at her convenince in next 3 months - labs ordered

## 2012-04-10 ENCOUNTER — Other Ambulatory Visit: Payer: Self-pay

## 2012-04-10 MED ORDER — ZOLPIDEM TARTRATE 10 MG PO TABS: 10.0000 mg | ORAL_TABLET | Freq: Every evening | ORAL | Status: DC | PRN

## 2012-04-10 NOTE — Telephone Encounter (Signed)
Done hardcopy to robin  

## 2012-04-11 NOTE — Telephone Encounter (Signed)
Faxed hardcopy to pharmacy. 

## 2012-04-17 ENCOUNTER — Telehealth: Payer: Self-pay | Admitting: Internal Medicine

## 2012-04-17 MED ORDER — ZOLPIDEM TARTRATE 10 MG PO TABS: 10.0000 mg | ORAL_TABLET | Freq: Every evening | ORAL | Status: DC | PRN

## 2012-04-17 NOTE — Telephone Encounter (Signed)
Done hardcopy to robin repeat rx, though original was done erx (as stated per [facility]  Ok to fax again, may need to call to let them know this is an original rx and signed  Please let pt know, due for ROV for further refills, last seen dec 2012

## 2012-04-18 NOTE — Telephone Encounter (Signed)
Called the pharmacy informed of MD instructions.  The pharmacist apologized as MD was correct and fax should not have been sent at all from [facility]. Did call the patient to inform of MD instructions.the patient will come in Jan. For appt. Due to insurance reasons.

## 2012-06-14 ENCOUNTER — Other Ambulatory Visit: Payer: Self-pay | Admitting: Internal Medicine

## 2012-09-05 ENCOUNTER — Encounter: Payer: Self-pay | Admitting: Internal Medicine

## 2012-09-05 ENCOUNTER — Ambulatory Visit (INDEPENDENT_AMBULATORY_CARE_PROVIDER_SITE_OTHER): Payer: Managed Care, Other (non HMO) | Admitting: Internal Medicine

## 2012-09-05 ENCOUNTER — Other Ambulatory Visit: Payer: Managed Care, Other (non HMO)

## 2012-09-05 VITALS — BP 140/80 | HR 79 | Temp 98.1°F | Ht 72.0 in | Wt 212.0 lb

## 2012-09-05 DIAGNOSIS — Z0000 Encounter for general adult medical examination without abnormal findings: Secondary | ICD-10-CM

## 2012-09-05 LAB — POCT URINALYSIS DIPSTICK
Ketones, UA: NEGATIVE
Protein, UA: NEGATIVE
Spec Grav, UA: 1.01
pH, UA: 5

## 2012-09-05 MED ORDER — ALPRAZOLAM 0.5 MG PO TABS: 0.5000 mg | ORAL_TABLET | Freq: Two times a day (BID) | ORAL | Status: DC | PRN

## 2012-09-05 MED ORDER — CIPROFLOXACIN HCL 500 MG PO TABS: 500.0000 mg | ORAL_TABLET | Freq: Two times a day (BID) | ORAL | Status: DC

## 2012-09-05 MED ORDER — ALBUTEROL SULFATE HFA 108 (90 BASE) MCG/ACT IN AERS: INHALATION_SPRAY | RESPIRATORY_TRACT | Status: DC

## 2012-09-05 NOTE — Assessment & Plan Note (Signed)
ua dip c/w prob uti, for cipro course, check urine cx

## 2012-09-05 NOTE — Assessment & Plan Note (Signed)
Overall doing well, age appropriate education and counseling updated, referrals for preventative services and immunizations addressed, dietary and smoking counseling addressed, most recent labs reviewed.  I have personally reviewed and have noted: 1) the patient's medical and social history 2) The pt's use of alcohol, tobacco, and illicit drugs 3) The patient's current medications and supplements 4) Functional ability including ADL's, fall risk, home safety risk, hearing and visual impairment 5) Diet and physical activities 6) Evidence for depression or mood disorder 7) The patient's height, weight, and BMI have been recorded in the chart I have made referrals, and provided counseling and education based on review of the above Of note, declines all labs, immmunizations and screening colonoscopy

## 2012-09-05 NOTE — Progress Notes (Signed)
Subjective:    Patient ID: Terri Fletcher, female    DOB: 1961-01-17, 52 y.o.   MRN: [id number]  HPI Here for wellness and f/u;  Overall doing ok;  Pt denies CP, worsening SOB, DOE, wheezing, orthopnea, PND, worsening LE edema, palpitations, dizziness or syncope.  Pt denies neurological change such as new headache, facial or extremity weakness.  Pt denies polydipsia, polyuria, or low sugar symptoms. Pt states overall good compliance with treatment and medications, good tolerability, and has been trying to follow lower cholesterol diet.  Pt denies worsening depressive symptoms, suicidal ideation or panic. No fever, night sweats, wt loss, loss of appetite, or other constitutional symptoms.  Pt states good ability with ADL's, has low fall risk, home safety reviewed and adequate, no other significant changes in hearing or vision, and only occasionally active with exercise.  Incidentally with urinary freq and dysuria for 2-3 days. Past Medical History  Diagnosis Date  . ALLERGIC RHINITIS   . ANXIETY, SITUATIONAL   . ASTHMA   . COMMON MIGRAINE   . DIARRHEA, RECURRENT   . INSOMNIA, PERSISTENT    No past surgical history on file.  reports that she has quit smoking. She does not have any smokeless tobacco history on file. She reports that she does not drink alcohol or use illicit drugs. family history is not on file. Allergies  Allergen Reactions  . Cefuroxime Axetil     REACTION: nausea   Current Outpatient Prescriptions on File Prior to Visit  Medication Sig Dispense Refill  . zolpidem (AMBIEN) 10 MG tablet Take 1 tablet (10 mg total) by mouth at bedtime as needed for sleep.  90 tablet  0  . Fluticasone-Salmeterol (ADVAIR DISKUS) 250-50 MCG/DOSE AEPB Inhale 1 puff into the lungs 2 (two) times daily.  60 each  0   No current facility-administered medications on file prior to visit.   Review of Systems Constitutional: Negative for diaphoresis, activity change, appetite change or unexpected weight  change.  HENT: Negative for hearing loss, ear pain, facial swelling, mouth sores and neck stiffness.   Eyes: Negative for pain, redness and visual disturbance.  Respiratory: Negative for shortness of breath and wheezing.   Cardiovascular: Negative for chest pain and palpitations.  Gastrointestinal: Negative for diarrhea, blood in stool, abdominal distention or other pain Genitourinary: Negative for hematuria, flank pain or change in urine volume.  Musculoskeletal: Negative for myalgias and joint swelling.  Skin: Negative for color change and wound.  Neurological: Negative for syncope and numbness. other than noted Hematological: Negative for adenopathy.  Psychiatric/Behavioral: Negative for hallucinations, self-injury, decreased concentration and agitation.      Objective:   Physical Exam BP 140/80  Pulse 79  Temp(Src) 98.1 F (36.7 C) (Oral)  Ht 6' (1.829 m)  Wt 212 lb (96.163 kg)  BMI 28.75 kg/m2  SpO2 98% VS noted,  Constitutional: Pt is oriented to person, place, and time. Appears well-developed and well-nourished.  Head: Normocephalic and atraumatic.  Right Ear: External ear normal.  Left Ear: External ear normal.  Nose: Nose normal.  Mouth/Throat: Oropharynx is clear and moist.  Eyes: Conjunctivae and EOM are normal. Pupils are equal, round, and reactive to light.  Neck: Normal range of motion. Neck supple. No JVD present. No tracheal deviation present.  Cardiovascular: Normal rate, regular rhythm, normal heart sounds and intact distal pulses.   Pulmonary/Chest: Effort normal and breath sounds normal.  Abdominal: Soft. Bowel sounds are normal. There is no tenderness. No HSM  Musculoskeletal: Normal range of  motion. Exhibits no edema.  Lymphadenopathy:  Has no cervical adenopathy.  Neurological: Pt is alert and oriented to person, place, and time. Pt has normal reflexes. No cranial nerve deficit.  Skin: Skin is warm and dry. No rash noted.  Psychiatric:  Has  normal mood  and affect. Behavior is normal.     Assessment & Plan:

## 2012-09-05 NOTE — Patient Instructions (Addendum)
Please take all new medication as prescribed - the antibiotic Please continue all other medications as before, and refills have been done if requested (the proair, and xanax) Please call or let us know on MyChart if you change your mind about the immunizations, screening colonoscopy, and lab testing Your sample will be sent for culture, and you can check Mychart in a few days for the result Thank you for enrolling in MyChart. Please follow the instructions below to securely access your online medical record. MyChart allows you to send messages to your doctor, view your test results, renew your prescriptions, schedule appointments, and more. To Log into My Chart online, please go by [employer] or [employer] to [email or web address], or download the MyChart App from the [employer] of [employer] .  Your Username is: [email or web address] (pass [id number]) Please send a practice Message on Mychart later today. Please return in 1 year for your yearly visit, or sooner if needed

## 2012-09-17 ENCOUNTER — Encounter: Payer: Self-pay | Admitting: Internal Medicine

## 2012-09-17 ENCOUNTER — Other Ambulatory Visit: Payer: Self-pay | Admitting: Internal Medicine

## 2012-09-17 DIAGNOSIS — N63 Unspecified lump in unspecified breast: Secondary | ICD-10-CM

## 2012-09-19 ENCOUNTER — Telehealth: Payer: Self-pay | Admitting: Internal Medicine

## 2012-09-19 DIAGNOSIS — N6320 Unspecified lump in the left breast, unspecified quadrant: Secondary | ICD-10-CM

## 2012-09-19 NOTE — Telephone Encounter (Signed)
Pt call again today to f/u on the req about her surgery. Pt stated that [facility] req for pt to have breast ultrasound in order for them to go on with the surgery appt. Please advise.

## 2012-09-19 NOTE — Telephone Encounter (Signed)
Us ordered

## 2012-09-19 NOTE — Telephone Encounter (Signed)
Informed the patient

## 2012-09-19 NOTE — Telephone Encounter (Signed)
Patient Information:  Caller Name: Karlie  Phone: [phone]  Patient: Terri Fletcher, Terri Fletcher  Gender: Female  DOB: 13-May-1961  Age: 52 Years  PCP: Oliver Barre (Adults only)  Pregnant: No  Office Follow Up:  Does the office need to follow up with this patient?: No  Instructions For The Office: N/A  RN Note:  Pt advised per Epic that referral had been made and appt scheduling was pending.  Symptoms  Reason For Call & Symptoms: Pt is calling about referral to Surgeon for eval of breast lump.  Per Epic pt given information that referral had been completed and appt was pending.  Reviewed Health History In EMR: Yes  Reviewed Medications In EMR: Yes  Reviewed Allergies In EMR: Yes  Reviewed Surgeries / Procedures: Yes  Date of Onset of Symptoms: 09/19/2012 OB / GYN:  LMP: Unknown  Guideline(s) Used:  No Protocol Available - Information Only  Disposition Per Guideline:   Home Care  Reason For Disposition Reached:   Information only question and nurse able to answer  Advice Given:  Call Back If:  New symptoms develop  You become worse.  Patient Will Follow Care Advice:  YES

## 2012-09-20 ENCOUNTER — Telehealth: Payer: Self-pay | Admitting: Internal Medicine

## 2012-09-20 NOTE — Telephone Encounter (Signed)
Dr Jonny Ruiz I called [facility] trying to schedule a US BREAST BILATERAL ,was told by [facility] imaging that the pt must have  An order for diagnostic mammogram of her left breast first and get an order  From Dr Jonny Ruiz. I called the pt to see where she had her last diagnostic mammogram at. she states she had never had on done before , and she didn't want one just the [place] so   explain  to her that they are suggestion a  diagnostic mammogram first then they can do the [place] pt states at her time of her visit she did not want to have a diagnostic mammogram performed , pt was very irate and she cursed at me hung up  No appt was made for this pt

## 2012-09-20 NOTE — Telephone Encounter (Signed)
I dont have anything else to offer at this time if surgeon requires u/s to be done prior, but imaging center will not do u/s without mammogram

## 2012-09-20 NOTE — Telephone Encounter (Signed)
The patients husband Casimiro Needle [phone]) called to inform his wife refuses to have a mammogram and only wants an [place].  He does not know what else to do.. She is fearful of the pain of a mammogram.  Please advise

## 2012-09-20 NOTE — Telephone Encounter (Signed)
I have nothing further to offer as pt refuses mammogram

## 2012-09-20 NOTE — Telephone Encounter (Signed)
Patient's husband informed

## 2012-09-24 ENCOUNTER — Other Ambulatory Visit: Payer: Self-pay | Admitting: Internal Medicine

## 2012-09-24 ENCOUNTER — Encounter: Payer: Self-pay | Admitting: Internal Medicine

## 2012-09-24 DIAGNOSIS — N63 Unspecified lump in unspecified breast: Secondary | ICD-10-CM

## 2012-09-25 ENCOUNTER — Ambulatory Visit (INDEPENDENT_AMBULATORY_CARE_PROVIDER_SITE_OTHER): Payer: Managed Care, Other (non HMO) | Admitting: Internal Medicine

## 2012-09-25 ENCOUNTER — Other Ambulatory Visit (INDEPENDENT_AMBULATORY_CARE_PROVIDER_SITE_OTHER): Payer: Managed Care, Other (non HMO)

## 2012-09-25 ENCOUNTER — Encounter: Payer: Self-pay | Admitting: Internal Medicine

## 2012-09-25 VITALS — BP 158/98 | HR 94 | Temp 98.2°F | Ht 72.0 in | Wt 204.2 lb

## 2012-09-25 DIAGNOSIS — R1032 Left lower quadrant pain: Secondary | ICD-10-CM | POA: Insufficient documentation

## 2012-09-25 DIAGNOSIS — R300 Dysuria: Secondary | ICD-10-CM | POA: Insufficient documentation

## 2012-09-25 DIAGNOSIS — R7302 Impaired glucose tolerance (oral): Secondary | ICD-10-CM

## 2012-09-25 DIAGNOSIS — N611 Abscess of the breast and nipple: Secondary | ICD-10-CM | POA: Insufficient documentation

## 2012-09-25 DIAGNOSIS — N61 Mastitis without abscess: Secondary | ICD-10-CM

## 2012-09-25 DIAGNOSIS — F438 Other reactions to severe stress: Secondary | ICD-10-CM

## 2012-09-25 HISTORY — DX: Impaired glucose tolerance (oral): R73.02

## 2012-09-25 LAB — LIPASE: Lipase: 21 U/L (ref 11.0–59.0)

## 2012-09-25 LAB — CBC WITH DIFFERENTIAL/PLATELET
Basophils Absolute: 0 10*3/uL (ref 0.0–0.1)
Eosinophils Absolute: 0 10*3/uL (ref 0.0–0.7)
HCT: 43.6 % (ref 36.0–46.0)
Hemoglobin: 14.8 g/dL (ref 12.0–15.0)
Lymphs Abs: 2 10*3/uL (ref 0.7–4.0)
MCHC: 33.9 g/dL (ref 30.0–36.0)
MCV: 91.2 fl (ref 78.0–100.0)
Monocytes Absolute: 0.5 10*3/uL (ref 0.1–1.0)
Neutro Abs: 6.7 10*3/uL (ref 1.4–7.7)
Platelets: 252 10*3/uL (ref 150.0–400.0)
RDW: 13.2 % (ref 11.5–14.6)

## 2012-09-25 LAB — HEPATIC FUNCTION PANEL
ALT: 18 U/L (ref 0–35)
Bilirubin, Direct: 0.1 mg/dL (ref 0.0–0.3)
Total Protein: 7.9 g/dL (ref 6.0–8.3)

## 2012-09-25 LAB — POCT URINALYSIS DIPSTICK
Bilirubin, UA: NEGATIVE
Ketones, UA: NEGATIVE
Leukocytes, UA: NEGATIVE
Spec Grav, UA: 1.01
pH, UA: 5

## 2012-09-25 LAB — BASIC METABOLIC PANEL
CO2: 26 mEq/L (ref 19–32)
Chloride: 104 mEq/L (ref 96–112)
Creatinine, Ser: 0.7 mg/dL (ref 0.4–1.2)

## 2012-09-25 MED ORDER — OXYCODONE HCL 5 MG PO TABS: ORAL_TABLET | ORAL | Status: DC

## 2012-09-25 MED ORDER — LEVOFLOXACIN 500 MG PO TABS: 500.0000 mg | ORAL_TABLET | Freq: Every day | ORAL | Status: DC

## 2012-09-25 MED ORDER — METRONIDAZOLE 250 MG PO TABS: 250.0000 mg | ORAL_TABLET | Freq: Three times a day (TID) | ORAL | Status: DC

## 2012-09-25 NOTE — Assessment & Plan Note (Addendum)
Presumed given exam, for antibx, pain med, refer gen surgury; to cancel the diag mammogram for now

## 2012-09-25 NOTE — Assessment & Plan Note (Addendum)
Cant r/o diverticulitis vs uti vs other, for lab today, dual antibx tx asd, consider CT but declines for now, consider GI; also for UA/culture with labs  Note:  Total time for pt hx, exam, review of record with pt in the room, determination of diagnoses and plan for further eval and tx is > 40 min, with over 50% spent in coordination and counseling of patient

## 2012-09-25 NOTE — Assessment & Plan Note (Signed)
Tried to reassure today, declines other tx

## 2012-09-25 NOTE — Assessment & Plan Note (Signed)
For urine studies, antibx asd

## 2012-09-25 NOTE — Patient Instructions (Addendum)
OK to cancel the mammogram and ultrasound (robin can inform the PCC's) Please take all new medication as prescribed - 2 antibiotic in case this is diverticulitis rather than the urinary tract infection Please take all new medication as prescribed - the pain medication You will be contacted regarding the referral for: general surgury today or tomorrow at the latest (to see PCC's now) The specimen will be sent for urine culture Please go to the LAB in the Basement (turn left off the elevator) for the tests to be done today You will be contacted by phone if any changes need to be made immediately.  Otherwise, you will receive a letter about your results with an explanation Thank you for enrolling in MyChart. Please follow the instructions below to securely access your online medical record. MyChart allows you to send messages to your doctor, view your test results, renew your prescriptions, schedule appointments, and more.

## 2012-09-25 NOTE — Progress Notes (Signed)
Subjective:    Patient ID: Terri Fletcher, female    DOB: 1961/06/17, 52 y.o.   MRN: [id number]  HPI  Here with acute onset 2-3 days pain with urination, burning type with some urgency and req, but also with lower abd pain and some radiation to left flank with fever, general weakness and malaisem but Denies urinary symptoms such as other flank pain, hematuria or n/v, chills.  Incidentally has had lump to left breast and we have had numerous phone contact with her to try to arrange first diag mammogram and breast u/s (but pt refused mamogram due to fear of test), then gen surgury referral but declined as they required an u/s to be done prior but radiology declined to do just the ultrasound, then to back the beginning with another order for the diag mammo with u/s as she realized she would not be further eval without the imaging.  Lump Has become also markedly tender and more swollen in the past 2 days as well, without skin change, nipple d/c.  Now with marked pain/ill and even assents to blood draw today that she has steadfastly refused for several years. Past Medical History  Diagnosis Date  . ALLERGIC RHINITIS   . ANXIETY, SITUATIONAL   . ASTHMA   . COMMON MIGRAINE   . DIARRHEA, RECURRENT   . INSOMNIA, PERSISTENT   . Impaired glucose tolerance 09/25/2012   No past surgical history on file.  reports that she has quit smoking. She does not have any smokeless tobacco history on file. She reports that she does not drink alcohol or use illicit drugs. family history is not on file. Allergies  Allergen Reactions  . Cefuroxime Axetil     REACTION: nausea   Current Outpatient Prescriptions on File Prior to Visit  Medication Sig Dispense Refill  . albuterol (PROAIR HFA) 108 (90 BASE) MCG/ACT inhaler INHALE TWO PUFFS BY MOUTH FOUR TIMES DAILY FOR WHEEZING  8.5 each  2  . ALPRAZolam (XANAX) 0.5 MG tablet Take 1 tablet (0.5 mg total) by mouth 2 (two) times daily as needed.  60 tablet  2  . zolpidem  (AMBIEN) 10 MG tablet Take 1 tablet (10 mg total) by mouth at bedtime as needed for sleep.  90 tablet  0  . Fluticasone-Salmeterol (ADVAIR DISKUS) 250-50 MCG/DOSE AEPB Inhale 1 puff into the lungs 2 (two) times daily.  60 each  0   No current facility-administered medications on file prior to visit.   Review of Systems  Constitutional: Negative for unexpected weight change, or unusual diaphoresis  HENT: Negative for tinnitus.   Eyes: Negative for photophobia and visual disturbance.  Respiratory: Negative for choking and stridor.   Gastrointestinal: Negative for vomiting and blood in stool.  Genitourinary: Negative for hematuria and decreased urine volume.  Musculoskeletal: Negative for acute joint swelling Skin: Negative for color change and wound.  Neurological: Negative for tremors and numbness other than noted  Psychiatric/Behavioral: Negative for decreased concentration or  hyperactivity.       Objective:   Physical Exam BP 158/98  Pulse 94  Temp(Src) 98.2 F (36.8 C) (Oral)  Ht 6' (1.829 m)  Wt 204 lb 4 oz (92.647 kg)  BMI 27.7 kg/m2  SpO2 98% VS noted, mild to mod ill Constitutional: Pt appears well-developed and well-nourished.  HENT: Head: NCAT.  Right Ear: External ear normal.  Left Ear: External ear normal.  Left breast with approx 1.5 cm area subq nodular marked tender mass approx 1 cm above areola,  no skin change and no nipple d/c Right breast without tender, mass or d/c Eyes: Conjunctivae and EOM are normal. Pupils are equal, round, and reactive to light.  Neck: Normal range of motion. Neck supple.  Cardiovascular: Normal rate and regular rhythm.   Pulmonary/Chest: Effort normal and breath sounds normal.  Abd:  Soft,non-distended, + BS but mod tender to mild palpation LLQ, without guarding or rebound  Neurological: Pt is alert. Not confused , motor intact Skin: Skin is warm. No erythema.  Psychiatric: Pt behavior is normal. Thought content normal. 1-2+ nervous     Assessment & Plan:

## 2012-09-26 ENCOUNTER — Encounter (INDEPENDENT_AMBULATORY_CARE_PROVIDER_SITE_OTHER): Payer: Self-pay | Admitting: General Surgery

## 2012-09-26 ENCOUNTER — Ambulatory Visit (INDEPENDENT_AMBULATORY_CARE_PROVIDER_SITE_OTHER): Payer: Managed Care, Other (non HMO) | Admitting: General Surgery

## 2012-09-26 ENCOUNTER — Ambulatory Visit: Payer: Managed Care, Other (non HMO)

## 2012-09-26 VITALS — BP 142/90 | HR 98 | Temp 101.3°F | Resp 20 | Ht 72.0 in | Wt 201.8 lb

## 2012-09-26 DIAGNOSIS — N6009 Solitary cyst of unspecified breast: Secondary | ICD-10-CM

## 2012-09-26 DIAGNOSIS — N6002 Solitary cyst of left breast: Secondary | ICD-10-CM

## 2012-09-26 DIAGNOSIS — R7309 Other abnormal glucose: Secondary | ICD-10-CM

## 2012-09-26 NOTE — Patient Instructions (Signed)
Your physical exam and the ultrasound that we performed in the office today suggests a benign cyst in the left breast at the 12:00 position. This can cause pain like this. It is not obviously infected, but I cannot be sure.  Dr. Derrell Lolling recommends that this area be aspirated and the fluid sent off for culture and studies. You have stated that he will need to have general anesthesia before this can be done. That is probably not indicated, at least at this point in time.  Dr. Derrell Lolling strongly recommends that you have bilateral mammograms because of your age and your family history of breast cancer in your mother. You have declined to have this done, at least at this time.  Take the antibiotics that Dr. Oliver Barre gave you.  Return to Dr. Derrell Lolling in one month for reevaluation, sooner if the pain gets worse or the mass gets bigger. We will decide what to do at that time.

## 2012-09-26 NOTE — Progress Notes (Signed)
Patient ID: Terri Fletcher, female   DOB: 1961-06-09, 52 y.o.   MRN: [id number]  Chief Complaint  Patient presents with  . Follow-up    Lt br abscess    HPI Terri Fletcher is a 52 y.o. female.  She is referred by Dr. Oliver Barre for a painful lump in the left breast at the 12:00 position, possibly an abscess.  This patient has no prior history of breast problems. She states she fell in February and developed some bruising of her sternum and right breast but had no problem with her left breast. She now has a tender lump in her left breast at the 12:00 position for 3-4 weeks and it feels like  tingling. She has never had a mammogram and has refused to have mammograms. She has an anxiety about this. She was referred for an ultrasound of the breast and the would not do an ultrasound in [place] to do mammogram. Imaging studies were then canceled and she was referred here.  She was placed on Levaquin and Flagyl by Dr. Jonny Ruiz, possibly for treatment of a urinary tract infection. Recent lab work showed a white count of 9300 and no abnormalities otherwise.  The only history is significant for breast cancer in the mother who had bilateral mastectomies. No other family history of breast or ovarian cancer.  She states that she will not allow any needles or further blood draws. She will allow any aspirations or procedures without general anesthesia. This is due to anxiety. She is very open about this.  HPI  Past Medical History  Diagnosis Date  . ALLERGIC RHINITIS   . ANXIETY, SITUATIONAL   . ASTHMA   . COMMON MIGRAINE   . DIARRHEA, RECURRENT   . INSOMNIA, PERSISTENT   . Impaired glucose tolerance 09/25/2012    Past Surgical History  Procedure Laterality Date  . Cesarean section  06/25/1982  . Cesarean section  11/25/1978    Family History  Problem Relation Age of Onset  . Cancer Mother     Breast  . Cancer Maternal Grandmother   . Heart attack Maternal Grandfather     Social History History    Substance Use Topics  . Smoking status: Former Smoker    Types: Cigarettes  . Smokeless tobacco: Never Used  . Alcohol Use: Yes     Comment: Occasional.    Allergies  Allergen Reactions  . Haldol (Haloperidol Lactate) Other (See Comments)    Lock Jaw  . Cefuroxime Axetil     REACTION: nausea    Current Outpatient Prescriptions  Medication Sig Dispense Refill  . albuterol (PROAIR HFA) 108 (90 BASE) MCG/ACT inhaler INHALE TWO PUFFS BY MOUTH FOUR TIMES DAILY FOR WHEEZING  8.5 each  2  . levofloxacin (LEVAQUIN) 500 MG tablet Take 1 tablet (500 mg total) by mouth daily.  10 tablet  0  . metroNIDAZOLE (FLAGYL) 250 MG tablet Take 1 tablet (250 mg total) by mouth 3 (three) times daily.  30 tablet  0  . oxyCODONE (OXY IR/ROXICODONE) 5 MG immediate release tablet 1-2 tabs every 4-6 hours as needed  60 tablet  0  . zolpidem (AMBIEN) 10 MG tablet Take 1 tablet (10 mg total) by mouth at bedtime as needed for sleep.  90 tablet  0  . ALPRAZolam (XANAX) 0.5 MG tablet Take 1 tablet (0.5 mg total) by mouth 2 (two) times daily as needed.  60 tablet  2   No current facility-administered medications for this visit.  Review of Systems Review of Systems  Constitutional: Negative for fever, chills and unexpected weight change.  HENT: Negative for hearing loss, congestion, sore throat, trouble swallowing and voice change.   Eyes: Negative for visual disturbance.  Respiratory: Negative for cough and wheezing.   Cardiovascular: Negative for chest pain, palpitations and leg swelling.  Gastrointestinal: Negative for nausea, vomiting, abdominal pain, diarrhea, constipation, blood in stool, abdominal distention and anal bleeding.  Genitourinary: Negative for hematuria, vaginal bleeding and difficulty urinating.  Musculoskeletal: Negative for arthralgias.  Skin: Negative for rash and wound.  Neurological: Negative for seizures, syncope and headaches.  Hematological: Negative for adenopathy. Does not  bruise/bleed easily.  Psychiatric/Behavioral: Negative for confusion. The patient is nervous/anxious.     Blood pressure 142/90, pulse 98, temperature 101.3 F (38.5 C), temperature source Temporal, resp. rate 20, height 6' (1.829 m), weight 201 lb 12.8 oz (91.536 kg).  Physical Exam Physical Exam  Constitutional: She is oriented to person, place, and time. She appears well-developed and well-nourished. No distress.  HENT:  Head: Normocephalic and atraumatic.  Eyes: Conjunctivae and EOM are normal. Pupils are equal, round, and reactive to light. Left eye exhibits no discharge. No scleral icterus.  Neck: Neck supple. No JVD present. No tracheal deviation present. No thyromegaly present.  Cardiovascular: Normal rate, regular rhythm, normal heart sounds and intact distal pulses.   No murmur heard. Pulmonary/Chest: Effort normal and breath sounds normal. No respiratory distress. She has no wheezes. She has no rales. She exhibits no tenderness.    Left breast. 12:00 position just above areolar margin. 2.5 cm smooth mobile well marginated mass suggesting a cyst. There is no sign of infection or cellulitis. This is tender to deep palpation. There is no other mass in either breast. There is no axillary adenopathy. Nipple areolar complexes looked normal. This feels like a cyst.  Lymphadenopathy:    She has no cervical adenopathy.  Neurological: She is alert and oriented to person, place, and time. She exhibits normal muscle tone. Coordination normal.  Skin: Skin is warm. No rash noted. She is not diaphoretic. No erythema. No pallor.  Psychiatric: She has a normal mood and affect. Her behavior is normal. Judgment and thought content normal.    Data Reviewed Office notes from Dr. Oliver Barre  Assessment    2.5 cm cyst left breast, 12:00 position. Following this is benign and noninfected, but I cannot be sure.  Anxiety  On Levaquin and Flagyl since yesterday, presumably to treat urinary tract  infection.  Family history of breast cancer in the mother  Asthma     Plan    I had a long talk with the patient and her significant other. I clearly told her that I thought that the best course of action was to aspirate the cyst under local anesthesia for culture and studies. I told her that after this resolved she should have  bilateral mammograms because of her risk factors... family history and age. She understands this but is unwilling to do that at this time. She is unwilling to allow me to anesthetize the breast or to do a needle aspiration.  Considering her strong feelings about this, then we will try to do the next best thing, which is to see what happens over the next 3 or 4 weeks. She is on antibiotics. There is no obvious sign of infection the breast. I will see her back in one month. If it is still has a tender mass, I will consider aspiration under  anesthesia at that time. If we are able to get through that,  I will try to talk her into mammograms eventually.        Angelia Mould. Derrell Lolling, M.D., [facility] General and Minimally invasive Surgery Breast and Colorectal Surgery Office:   [phone] Pager:   [phone]  09/26/2012, 4:01 PM

## 2012-09-28 ENCOUNTER — Other Ambulatory Visit: Payer: Self-pay | Admitting: Internal Medicine

## 2012-09-28 LAB — URINE CULTURE

## 2012-09-28 MED ORDER — CEPHALEXIN 500 MG PO CAPS: 500.0000 mg | ORAL_CAPSULE | Freq: Four times a day (QID) | ORAL | Status: DC

## 2012-09-28 NOTE — Telephone Encounter (Deleted)
Done hardcopy to robin  

## 2012-10-03 ENCOUNTER — Other Ambulatory Visit: Payer: Self-pay

## 2012-10-03 MED ORDER — ZOLPIDEM TARTRATE 10 MG PO TABS: 10.0000 mg | ORAL_TABLET | Freq: Every evening | ORAL | Status: DC | PRN

## 2012-10-03 NOTE — Telephone Encounter (Signed)
Done hardcopy to robin  

## 2012-10-03 NOTE — Telephone Encounter (Signed)
Faxed hardcopy to pharmacy. 

## 2012-10-08 ENCOUNTER — Telehealth: Payer: Self-pay | Admitting: Internal Medicine

## 2012-10-08 NOTE — Telephone Encounter (Signed)
Patient Information:  Caller Name: Jordanne  Phone: [phone]  Patient: Terri Fletcher, Terri Fletcher  Gender: Female  DOB: 1960/12/06  Age: 52 Years  PCP: Oliver Barre (Adults only)  Pregnant: No  Office Follow Up:  Does the office need to follow up with this patient?: Yes  Instructions For The Office: Patient cannot make an appt to be seen today she is in meetings.  She states that it would have to be tomorrow. Pain continues in back and urinary discomfort continues.  What should she do? PLEASE HAVE DR. JOHN ADVISE  RN Note:  Patient cannot make an appt to be seen today she is in meetings.  She states that it would have to be tomorrow. Pain continues in back and urinary discomfort continues.  What should she do? PLEASE HAVE DR. JOHN ADVISE  Symptoms  Reason For Call & Symptoms: Patient has been seen in office for UTI . Office on 09/05/12 on Cipro and then on Cephalexin.  She states she feels like this is a Kidney infection  with pain in her Left back and  left hip bone. Intermittent discomfort and worse with sitting for a length of time.  No burning, +frequency, no urgency, no blood in urine but cloudy, no odor. Last UOP 1 hour ago  Reviewed Health History In EMR: Yes  Reviewed Medications In EMR: Yes  Reviewed Allergies In EMR: Yes  Reviewed Surgeries / Procedures: Yes  Date of Onset of Symptoms: 09/05/2012 OB / GYN:  LMP: Unknown  Guideline(s) Used:  Urination Pain - Female  Disposition Per Guideline:   Go to Office Now  Reason For Disposition Reached:   Side (flank) or lower back pain present  Advice Given:  Fluids:   Drink extra fluids. Drink 8-10 glasses of liquids a day (Reason: to produce a dilute, non-irritating urine).  Fluids:   Drink extra fluids. Drink 8-10 glasses of liquids a day (Reason: to produce a dilute, non-irritating urine).  Cranberry Juice:   Some people think that drinking cranberry juice may help in fighting urinary tract infections. However, there is no good  research that has ever proved this.  Dosage 100% Cranberry Juice: 1 oz (30 ml) twice a day.  Call Back If:  You become worse.  RN Overrode Recommendation:  Patient Requests Prescription  Patient cannot make an appt to be seen today she is in meetings.  She states that it would have to be tomorrow. Pain continues in back and urinary discomfort continues.  What should she do? PLEASE HAVE DR. JOHN ADVISE

## 2012-10-08 NOTE — Telephone Encounter (Signed)
Patient informed. 

## 2012-10-08 NOTE — Telephone Encounter (Signed)
Just had 10 days of antibx that should have been effective, if no fever consider OV tomorrow with me or regina

## 2012-10-09 ENCOUNTER — Encounter: Payer: Self-pay | Admitting: Internal Medicine

## 2012-10-09 ENCOUNTER — Ambulatory Visit (INDEPENDENT_AMBULATORY_CARE_PROVIDER_SITE_OTHER): Payer: Managed Care, Other (non HMO) | Admitting: Internal Medicine

## 2012-10-09 VITALS — BP 132/80 | HR 80 | Temp 98.2°F | Ht 72.0 in | Wt 214.4 lb

## 2012-10-09 DIAGNOSIS — R7309 Other abnormal glucose: Secondary | ICD-10-CM

## 2012-10-09 DIAGNOSIS — N390 Urinary tract infection, site not specified: Secondary | ICD-10-CM

## 2012-10-09 DIAGNOSIS — K921 Melena: Secondary | ICD-10-CM

## 2012-10-09 DIAGNOSIS — R109 Unspecified abdominal pain: Secondary | ICD-10-CM

## 2012-10-09 DIAGNOSIS — R1032 Left lower quadrant pain: Secondary | ICD-10-CM

## 2012-10-09 DIAGNOSIS — R7302 Impaired glucose tolerance (oral): Secondary | ICD-10-CM

## 2012-10-09 LAB — POCT URINALYSIS DIPSTICK
Bilirubin, UA: NEGATIVE
Glucose, UA: NEGATIVE
Ketones, UA: NEGATIVE
Spec Grav, UA: 1.01
Urobilinogen, UA: NEGATIVE

## 2012-10-09 NOTE — Assessment & Plan Note (Signed)
Mild to mod persistent despite prob successful UTI tx, ua dip neg in the office today, note pt with severe aversion to "invasive" eval such as blood draws or procedures, but did have OK renal fxn per apr 8 labs, so will proceed with ct abd/pelvis with CM, and refer GI (likely needs colonscopy but not clear if she will allow this)

## 2012-10-09 NOTE — Assessment & Plan Note (Signed)
Minor with recent illness, pt declines a1c today

## 2012-10-09 NOTE — Assessment & Plan Note (Signed)
Minor self limited this am, but in the setting of persistent LLQ pain - for GI referral, may need colonscopy for complete eval, r/o colitis or malignancy

## 2012-10-09 NOTE — Patient Instructions (Signed)
Please continue all other medications as before No new medications today You will be contacted regarding the referral for: CT scan for the abd/pelvis - to see PCC's now You will be contacted regarding the referral for: Gastroenterology

## 2012-10-09 NOTE — Progress Notes (Signed)
Subjective:    Patient ID: Terri Fletcher, female    DOB: 07-05-1960, 52 y.o.   MRN: [id number]  HPI  Here to f/u, Denies urinary symptoms such as dysuria, frequency, urgency, flank pain, hematuria or n/v, fever, chills, but still has signficant LLQ tenderness/pain without radiation or overt bowel change except did have small amount BRB on tissue this am.  Pt denies chest pain, increased sob or doe, wheezing, orthopnea, PND, increased LE swelling, palpitations, dizziness or syncope.  Pt denies new neurological symptoms such as new headache, or facial or extremity weakness or numbness   Pt denies polydipsia, polyuria. Past Medical History  Diagnosis Date  . ALLERGIC RHINITIS   . ANXIETY, SITUATIONAL   . ASTHMA   . COMMON MIGRAINE   . DIARRHEA, RECURRENT   . INSOMNIA, PERSISTENT   . Impaired glucose tolerance 09/25/2012   Past Surgical History  Procedure Laterality Date  . Cesarean section  06/25/1982  . Cesarean section  11/25/1978    reports that she has quit smoking. Her smoking use included Cigarettes. She smoked 0.00 packs per day. She has never used smokeless tobacco. She reports that  drinks alcohol. She reports that she uses illicit drugs (Marijuana). family history includes Cancer in her maternal grandmother and mother and Heart attack in her maternal grandfather. Allergies  Allergen Reactions  . Haldol (Haloperidol Lactate) Other (See Comments)    Lock Jaw  . Cefuroxime Axetil     REACTION: nausea   Current Outpatient Prescriptions on File Prior to Visit  Medication Sig Dispense Refill  . albuterol (PROAIR HFA) 108 (90 BASE) MCG/ACT inhaler INHALE TWO PUFFS BY MOUTH FOUR TIMES DAILY FOR WHEEZING  8.5 each  2  . ALPRAZolam (XANAX) 0.5 MG tablet Take 1 tablet (0.5 mg total) by mouth 2 (two) times daily as needed.  60 tablet  2  . oxyCODONE (OXY IR/ROXICODONE) 5 MG immediate release tablet 1-2 tabs every 4-6 hours as needed  60 tablet  0  . zolpidem (AMBIEN) 10 MG tablet Take 1  tablet (10 mg total) by mouth at bedtime as needed for sleep.  90 tablet  1   No current facility-administered medications on file prior to visit.   Review of Systems  Constitutional: Negative for unexpected weight change, or unusual diaphoresis  HENT: Negative for tinnitus.   Eyes: Negative for photophobia and visual disturbance.  Respiratory: Negative for choking and stridor.   Gastrointestinal: Negative for vomiting and blood in stool.  Genitourinary: Negative for hematuria and decreased urine volume.  Musculoskeletal: Negative for acute joint swelling Skin: Negative for color change and wound.  Neurological: Negative for tremors and numbness other than noted  Psychiatric/Behavioral: Negative for decreased concentration or  hyperactivity.       Objective:   Physical Exam BP 132/80  Pulse 80  Temp(Src) 98.2 F (36.8 C) (Oral)  Ht 6' (1.829 m)  Wt 214 lb 6 oz (97.24 kg)  BMI 29.07 kg/m2  SpO2 96% VS noted, ? Mild ill Constitutional: Pt appears well-developed and well-nourished.  HENT: Head: NCAT.  Right Ear: External ear normal.  Left Ear: External ear normal.  Eyes: Conjunctivae and EOM are normal. Pupils are equal, round, and reactive to light.  Neck: Normal range of motion. Neck supple.  Cardiovascular: Normal rate and regular rhythm.   Pulmonary/Chest: Effort normal and breath sounds normal.  Abd:  Soft,  non-distended, + BS with mod LLQ tender, no guarding or rebound Neurological: Pt is alert. Not confused , motor intact  Skin: Skin is warm. No erythema.  Psychiatric: Pt behavior is normal. Thought content normal. 2+ nervous        Assessment & Plan:

## 2012-10-09 NOTE — Assessment & Plan Note (Signed)
Clinically resolved,  to f/u any worsening symptoms or concerns 

## 2012-10-10 ENCOUNTER — Telehealth: Payer: Self-pay | Admitting: Internal Medicine

## 2012-10-10 MED ORDER — CIPROFLOXACIN HCL 500 MG PO TABS: 500.0000 mg | ORAL_TABLET | Freq: Two times a day (BID) | ORAL | Status: DC

## 2012-10-10 MED ORDER — METRONIDAZOLE 250 MG PO TABS: 250.0000 mg | ORAL_TABLET | Freq: Three times a day (TID) | ORAL | Status: DC

## 2012-10-10 NOTE — Telephone Encounter (Signed)
CT unable to get approved in timely fashion  Pt with signficant LLQ pain, UA dip neg and with small hematochezia  Etiology unclear, but will need to tx empirically for focal colitis - cipro/flagyl  Will need to f/u with GI as referred

## 2012-10-10 NOTE — Telephone Encounter (Signed)
Dr. Jonny Ruiz  Pt insurance did not approve pt procedure to have a CT ABDOMEN PELVIS W CONTRAST, I DID FAX OVER CLINICALS  With that being done still was not approved. I called pt to inform her of this,  and the fax is on your cart to review - pt wants to know what's the next step in care since ct was not approve pls advise

## 2012-10-11 ENCOUNTER — Encounter: Payer: Self-pay | Admitting: Gastroenterology

## 2012-10-11 NOTE — Telephone Encounter (Signed)
Called the patient informed of all information.

## 2012-10-17 ENCOUNTER — Telehealth: Payer: Self-pay | Admitting: *Deleted

## 2012-10-17 NOTE — Telephone Encounter (Signed)
Pt has appointment with GI and Dr. Jarold Motto on 5/15-but her CT scan was denied by insurance. Per insurance, pt needs to have ultrasound first. Pt is calling to see if U/S can be ordered.

## 2012-10-17 NOTE — Telephone Encounter (Signed)
Patient informed. 

## 2012-10-17 NOTE — Telephone Encounter (Signed)
Unfortunately, u/s is not helpful for her kind of problem; ok to hold for now

## 2012-10-18 ENCOUNTER — Encounter: Payer: Self-pay | Admitting: Internal Medicine

## 2012-10-18 ENCOUNTER — Ambulatory Visit (INDEPENDENT_AMBULATORY_CARE_PROVIDER_SITE_OTHER): Payer: Managed Care, Other (non HMO) | Admitting: General Surgery

## 2012-10-18 ENCOUNTER — Encounter (INDEPENDENT_AMBULATORY_CARE_PROVIDER_SITE_OTHER): Payer: Self-pay | Admitting: General Surgery

## 2012-10-18 VITALS — BP 142/80 | HR 72 | Temp 98.1°F | Resp 18 | Ht 72.0 in | Wt 205.5 lb

## 2012-10-18 DIAGNOSIS — N611 Abscess of the breast and nipple: Secondary | ICD-10-CM

## 2012-10-18 DIAGNOSIS — N61 Mastitis without abscess: Secondary | ICD-10-CM

## 2012-10-18 NOTE — Progress Notes (Signed)
Patient ID: Terri Fletcher, female   DOB: July 03, 1960, 52 y.o.   MRN: [id number] History:  Terri Fletcher is a 52 y.o. female. She was recently referred by Dr. Oliver Barre for a painful lump in the left breast at the 12:00 position, possibly an abscess.  This patient has no prior history of breast problems. She states she fell in February and developed some bruising of her sternum and right breast but had no problem with her left breast. She now has a tender lump in her left breast at the 12:00 position for 3-4 weeks and it feels like tingling. She has never had a mammogram and has refused to have mammograms. She has an anxiety about this. She was referred for an ultrasound of the breast and the would not do an ultrasound in [place] to do mammogram. Imaging studies were then canceled and she was referred here.  She was placed on Levaquin and Flagyl by Dr. Jonny Ruiz, possibly for treatment of a urinary tract infection. Recent lab work showed a white count of 9300 and no abnormalities otherwise.  The only history is significant for breast cancer in the mother who had bilateral mastectomies. No other family history of breast or ovarian cancer.  She states that she will not allow any needles or further blood draws. She will allow any aspirations or procedures without general anesthesia. This is due to anxiety. She is very open about this.  Ultrasound by me revealed a cystic mass. She declined aspiration of this. She was placed on Keflex and states that the lump got smaller but once she stopped to Keflex he got bigger again B. She says is still had is painful. No fever or chills.  ROS  10 system review of systems is performed once again. Unchanged in negative except as described above Family history: Breast cancer in the mother. Maternal grandmother had some type of cancer. Grandfather had heart attack. Social history: Former smoker. Married. Occasional alcohol  Exam: Patient is pleasant but anxious. Cooperative. Neck  supple no mass no jugular venous incision Lungs: Clear auscultation bilaterally Breast 4 cm smoothly marginated mass at the 12:00 position left breast just above the areolar margin. No sign of infection or cellulitis. Tender to palpation. No axillary adenopathy. Feels like a cyst. Heart: Regular rate and rhythm. No murmur. No ectopy.  Assessment: Left breast mass. Suspect cyst. Suspect this is benign and not infected. No indication for antibiotics Anxiety Family history of breast cancer in the mother Asthma  Plan: We'll long talk about different algorithms for care. I told her that we would like to help her with pain and to help resolve the mass and to ultimately get mammograms because of her family history of breast cancer. She agrees. She is unwilling to undergo needle aspiration in the office. She was willing to undergo the excision of this mass under general anesthesia and so we will schedule that surgery. She'll be scheduled for excision of left breast mass under general anesthesia as outpatient in the near future  I discussed the indications, details, techniques, and numerous risk of the surgery with her. She understands all these issues and all of her questions are answered. She agrees with this plan.   Angelia Mould. Derrell Lolling, M.D., [facility] General and Minimally invasive Surgery Breast and Colorectal Surgery Office:   [phone] Pager:   [phone]

## 2012-10-18 NOTE — Patient Instructions (Signed)
The pain your left breast at the 12:00 position is 3 or 4 cm in size. It is probably a cyst. It does not seem to be infected. You do not need antibiotics at this time.  We talked about different strategies for taking care of this. It is important that we help you with your pain and to remove this lump and to eventually get  mammograms because of your family history of breast cancer.  You will be scheduled for excision of your left breast mass under general anesthesia in the near future.    Breast Biopsy A breast biopsy is a procedure where a sample of breast tissue is removed from your breast. The tissue is examined under a microscope to see if cancerous cells are present. A breast biopsy is done when there is:  Any undiagnosed breast mass (tumor).  Nipple abnormalities, dimpling, crusting, or ulcerations.  Abnormal discharge from the nipple, especially blood.  Redness, swelling, and pain of the breast.  Calcium deposits (calcifications) or abnormalities seen on a mammogram, ultrasound result, or results of magnetic resonance imaging (MRI).  Suspicious changes in the breast seen on your mammogram. If the tumor is found to be cancerous (malignant), a breast biopsy can help to determine what the best treatment is for you. There are many different types of breast biopsies. Talk to your caregiver about your options and which type is best for you. LET YOUR CAREGIVER KNOW ABOUT:  Allergies to food or medicine.  Medicines taken, including vitamins, herbs, eyedrops, over-the-counter medicines, and creams.  Use of steroids (by mouth or creams).  Previous problems with anesthetics or numbing medicines.  History of bleeding problems or blood clots.  Previous surgery.  Other health problems, including diabetes and kidney problems.  Any recent colds or infections.  Possibility of pregnancy, if this applies. RISKS AND COMPLICATIONS   Bleeding.  Infection.  Allergy to  medicines.  Bruising and swelling of the breast.  Alteration in the shape of the breast.  Not finding the lump or abnormality.  Needing more surgery. BEFORE THE PROCEDURE  Arrange for someone to drive you home after the procedure.  Do not smoke for 2 weeks before the procedure. Stop smoking, if you smoke.  Do not drink alcohol for 24 hours before procedure.  Wear a good support bra to the procedure. PROCEDURE  You may be given a medicine to numb the breast area (local anesthesia) or a medicine to make you sleep (general anesthesia) during the procedure. The following are the different types of biopsies that can be performed.   Fine-needle aspiration A thin needle is attached to a syringe and inserted into the breast lump. Fluid and cells are removed and then looked at under a microscope. If the breast lump cannot be felt, an ultrasound may be used to help locate the lump and place the needle in the correct area.   Core needle biopsy A wide, hollow needle (core needle) is inserted into the breast lump 3 6 times to get tissue samples or cores. The samples are removed. The needle is usually placed in the correct area by using an ultrasound or X-ray.   Stereotactic biopsy X-ray equipment and a computer are used to analyze X-ray pictures of the breast lump. The computer then finds exactly where the core needle needs to be inserted. Tissue samples are removed.   Vacuum-assisted biopsy A small incision (less than  inch) is made in your breast. A biopsy device that includes a hollow needle and  vacuum is passed through the incision and into the breast tissue. The vacuum gently draws abnormal breast tissue into the needle to remove it. This type of biopsy removes a larger tissue sample than a regular core needle biopsy. No stitches are needed, and there is usually little scarring.  Ultrasound-guided core needle biopsy A high frequency ultrasound helps guide the core needle to the area of the  mass or abnormality. An incision is made to insert the needle. Tissue samples are removed.  Open biopsy A larger incision is made in the breast. Your caregiver will attempt to remove the whole breast lump or as much as possible. AFTER THE PROCEDURE  You will be taken to the recovery area. If you are doing well and have no problems, you will be allowed to go home.  You may notice bruising on your breast. This is normal.  Your caregiver may apply a pressure dressing on your breast for 24 48 hours. A pressure dressing is a bandage that is wrapped tightly around the chest to stop fluid from collecting underneath tissues. Document Released: 06/06/2005 Document Revised: 12/06/2011 Document Reviewed: 07/07/2011 [facility] Patient Information 2013 [place], [place].

## 2012-10-30 ENCOUNTER — Telehealth (INDEPENDENT_AMBULATORY_CARE_PROVIDER_SITE_OTHER): Payer: Self-pay | Admitting: *Deleted

## 2012-10-30 NOTE — Telephone Encounter (Signed)
I notified the pt per Dr Derrell Lolling, it doesn't matter to him but it will be anesthesia that will make her remove it preop or in the holding area.   They had told her to call Dr Derrell Lolling from the [facility].  She will call them back and let them know Dr Derrell Lolling is ok with it if anesthesia is.

## 2012-10-30 NOTE — Telephone Encounter (Signed)
Patient called to state that she is unable to remove her wedding ring on her left hand.  Patient is asking how big of a deal this is.  Patient states she would have to go have the ring cut off since she has not taken it off in 20 years.  Please advise.

## 2012-11-01 ENCOUNTER — Ambulatory Visit (INDEPENDENT_AMBULATORY_CARE_PROVIDER_SITE_OTHER): Payer: Managed Care, Other (non HMO) | Admitting: Gastroenterology

## 2012-11-01 ENCOUNTER — Encounter: Payer: Self-pay | Admitting: Gastroenterology

## 2012-11-01 VITALS — BP 128/82 | HR 80 | Ht 71.5 in | Wt 204.5 lb

## 2012-11-01 DIAGNOSIS — R109 Unspecified abdominal pain: Secondary | ICD-10-CM

## 2012-11-01 MED ORDER — TRAMADOL HCL 50 MG PO TABS: 50.0000 mg | ORAL_TABLET | Freq: Four times a day (QID) | ORAL | Status: DC | PRN

## 2012-11-01 NOTE — Patient Instructions (Addendum)
  You have been scheduled for an abdominal ultrasound at [facility] (1st floor of hospital) on 11-02-12 at 11 am. Please arrive 15 minutes prior to your appointment for registration. Make certain not to have anything to eat or drink 6 hours prior to your appointment. Should you need to reschedule your appointment, please contact radiology at [phone]. This test typically takes about 30 minutes to perform.  We have sent the following medications to your pharmacy for you to pick up at your convenience: Tramadol 50 mg, please take one tablet by mouth every six hours as needed for pain.  It has been recommended to you by your physician that you have a(n) Colonoscopy completed. Per your request, we did not schedule the procedure(s) today. Please contact our office at [phone] should you decide to have the procedure completed. _________________________________________________________________________________________                                               We are excited to introduce MyChart, a new best-in-class service that provides you online access to important information in your electronic medical record. We want to make it easier for you to view your health information - all in one secure location - when and where you need it. We expect MyChart will enhance the quality of care and service we provide.  When you register for MyChart, you can:    View your test results.    Request appointments and receive appointment reminders via email.    Request medication renewals.    View your medical history, allergies, medications and immunizations.    Communicate with your physician's office through a password-protected site.    Conveniently print information such as your medication lists.  To find out if MyChart is right for you, please talk to a member of our clinical staff today. We will gladly answer your questions about this free health and wellness tool.  If you are age 53  or older and want a member of your family to have access to your record, you must provide written consent by completing a proxy form available at our office. Please speak to our clinical staff about guidelines regarding accounts for patients younger than age 73.  As you activate your MyChart account and need any technical assistance, please call the MyChart technical support line at [phone]) or email your question to [email or web address]. If you email your question(s), please include your name, a return phone number and the best time to reach you.  If you have non-urgent health-related questions, you can send a message to our office through MyChart at [email or web address]. If you have a medical emergency, call 911.  Thank you for using MyChart as your new health and wellness resource!   MyChart licensed from [employer],  [id number]. Patents Pending.

## 2012-11-01 NOTE — Progress Notes (Signed)
History of Present Illness:  This is a 52 year old Caucasian female referred by Dr. Oliver Barre for evaluation of left flank pain since April 8 of this year.  This pain has musculoskeletal elements in that it is worse with movement and bending, and the patient does have a history of scoliosis.  She really denies any GI symptomatology, gives no history of colitis, diverticulitis, or any other known gastrointestinal problems.  The pain is in the left hip area radiating to her left flank.  She was initially treated for urinary tract infection by Dr. Jonny Ruiz, his CT scan of the abdomen and pelvis was denied by her insurance company.  She denies current genitourinary, upper GI or hepatobiliary complaints.  She is undergoing breast biopsy with Dr. Derrell Lolling early next week. beause of a left breast mass  Patient has repeatedly refused colonoscopy exam.  Family history is noncontributory.  She denies systemic complaints such as fever, chills, nausea vomiting, skin rashes, joint pains et Karie Soda.  Review of her labs shows a normal CBC, metabolic and liver profile.  A urinalysis after treatment for urinary tract infection was normal.  I have reviewed this patient's present history, medical and surgical past history, allergies and medications.     ROS:   All systems were reviewed and are negative unless otherwise stated in the HPI.    Physical Exam: Blood pressure 128/82, pulse 80 and regular weight 204 the BMI of 28.13. General well developed well nourished patient in no acute distress, appearing their stated age Eyes PERRLA, no icterus, fundoscopic exam per opthamologist Skin no lesions noted Neck supple, no adenopathy, no thyroid enlargement, no tenderness Chest clear to percussion and auscultation Heart no significant murmurs, gallops or rubs noted Abdomen no hepatosplenomegaly masses or tenderness, BS normal.  There is some tenderness to deep palpation over the sigmoid colon the left lower quadrant area.  No CVA  tenderness or psoas sign.  I cannot appreciate any discomfort or lump in her left inguinal area.  Examination the leg and back is unremarkable. Rectal patient refuses rectal exam. Extremities no acute joint lesions, edema, phlebitis or evidence of cellulitis. Neurologic patient oriented x 3, cranial nerves intact, no focal neurologic deficits noted. Psychological mental status normal and normal affect.  Assessment and plan: I doubt this pain is GI related, and in any case, the patient refuses colonoscopy exam which she needs because of her age and symptomatology.  Other considerations would be nephrolithiasis with chronic hydronephrosis on her left side, radiating pain from disc disease of her lumbosacral spine, or just plain musculoskeletal pain.  I have ordered ultrasound of her kidneys for evaluation, and given her some tramadol 50 mg every 6-8 hours to use when necessary, have urged her to have colonoscopy in the future.  She pretty much relates she will not participate in any further medical workups until she has completed her breast biopsies.   Cc ; Dr. Oliver Barre and Dr.Heywood Derrell Lolling [facility]  Encounter Diagnosis  Name Primary?  . Left sided abdominal pain Yes

## 2012-11-02 ENCOUNTER — Telehealth (INDEPENDENT_AMBULATORY_CARE_PROVIDER_SITE_OTHER): Payer: Self-pay

## 2012-11-02 ENCOUNTER — Ambulatory Visit (HOSPITAL_COMMUNITY)
Admission: RE | Admit: 2012-11-02 | Discharge: 2012-11-02 | Disposition: A | Discharge status: Home / Self Care | Payer: Managed Care, Other (non HMO) | Source: Ambulatory Visit | Attending: Gastroenterology | Admitting: Gastroenterology

## 2012-11-02 DIAGNOSIS — R109 Unspecified abdominal pain: Secondary | ICD-10-CM

## 2012-11-02 DIAGNOSIS — K8020 Calculus of gallbladder without cholecystitis without obstruction: Secondary | ICD-10-CM | POA: Insufficient documentation

## 2012-11-02 NOTE — Telephone Encounter (Signed)
Pt called asking if she can take a xanax am of surgery. I advised her to call [facility] and speak with Pre op nurse. Pt advised anesthesia MD will have to ok any meds to be taken the morning of surgery. Pt states she understands and will call them now to ask.

## 2012-11-05 ENCOUNTER — Other Ambulatory Visit (INDEPENDENT_AMBULATORY_CARE_PROVIDER_SITE_OTHER): Payer: Self-pay | Admitting: General Surgery

## 2012-11-05 ENCOUNTER — Telehealth (INDEPENDENT_AMBULATORY_CARE_PROVIDER_SITE_OTHER): Payer: Self-pay | Admitting: *Deleted

## 2012-11-05 DIAGNOSIS — N6089 Other benign mammary dysplasias of unspecified breast: Secondary | ICD-10-CM

## 2012-11-05 DIAGNOSIS — N6019 Diffuse cystic mastopathy of unspecified breast: Secondary | ICD-10-CM

## 2012-11-05 HISTORY — PX: BREAST SURGERY: SHX581

## 2012-11-05 NOTE — Telephone Encounter (Signed)
Husband came to office to state that Hydrocodone is not helping patient's pain.  Patient had surgery about 1.5 hours ago but states she wants something stronger.  Spoke to [place] MD who approved Percocet 5/325mg  1 tablet every 4-6 hours as needed for pain #30 no refills.  Signed by Dr. Donell Beers per Dr. Derrell Lolling.  Husband picked up prescription and dropped off other prescription which was shredded.

## 2012-11-07 NOTE — Progress Notes (Signed)
Quick Note:  Inform patient of Pathology report,. No malignancy. Good news. ______

## 2012-11-08 ENCOUNTER — Telehealth (INDEPENDENT_AMBULATORY_CARE_PROVIDER_SITE_OTHER): Payer: Self-pay

## 2012-11-08 NOTE — Telephone Encounter (Signed)
I called and notified the pt of her results.  I gave her a follow up appointment for 5/29

## 2012-11-08 NOTE — Progress Notes (Signed)
Quick Note:  Inform patient of Pathology report,.Benign. Good news. ______

## 2012-11-08 NOTE — Telephone Encounter (Signed)
Message copied by Ivory Broad on Thu Nov 08, 2012 12:17 PM ------      Message from: Ernestene Mention      Created: Wed Nov 07, 2012  5:44 PM       Inform patient of Pathology report,. No malignancy. Good news. ------

## 2012-11-15 ENCOUNTER — Encounter (INDEPENDENT_AMBULATORY_CARE_PROVIDER_SITE_OTHER): Payer: Managed Care, Other (non HMO) | Admitting: General Surgery

## 2012-11-15 ENCOUNTER — Other Ambulatory Visit: Payer: Self-pay | Admitting: Internal Medicine

## 2012-11-15 ENCOUNTER — Telehealth (INDEPENDENT_AMBULATORY_CARE_PROVIDER_SITE_OTHER): Payer: Self-pay

## 2012-11-15 ENCOUNTER — Ambulatory Visit (INDEPENDENT_AMBULATORY_CARE_PROVIDER_SITE_OTHER): Payer: Managed Care, Other (non HMO) | Admitting: Internal Medicine

## 2012-11-15 ENCOUNTER — Encounter: Payer: Self-pay | Admitting: Internal Medicine

## 2012-11-15 VITALS — BP 140/80 | HR 72 | Temp 98.5°F | Ht 72.0 in | Wt 204.0 lb

## 2012-11-15 DIAGNOSIS — J209 Acute bronchitis, unspecified: Secondary | ICD-10-CM | POA: Insufficient documentation

## 2012-11-15 DIAGNOSIS — J45901 Unspecified asthma with (acute) exacerbation: Secondary | ICD-10-CM

## 2012-11-15 DIAGNOSIS — F438 Other reactions to severe stress: Secondary | ICD-10-CM

## 2012-11-15 MED ORDER — AZITHROMYCIN 250 MG PO TABS: ORAL_TABLET | ORAL | Status: DC

## 2012-11-15 MED ORDER — HYDROCODONE-HOMATROPINE 5-1.5 MG/5ML PO SYRP: 5.0000 mL | ORAL_SOLUTION | Freq: Four times a day (QID) | ORAL | Status: DC | PRN

## 2012-11-15 MED ORDER — BUDESONIDE-FORMOTEROL FUMARATE 160-4.5 MCG/ACT IN AERO: 2.0000 | INHALATION_SPRAY | Freq: Two times a day (BID) | RESPIRATORY_TRACT | Status: DC

## 2012-11-15 MED ORDER — PREDNISONE 10 MG PO TABS: ORAL_TABLET | ORAL | Status: DC

## 2012-11-15 NOTE — Patient Instructions (Signed)
Please take all new medication as prescribed  - the antibiotic, cough medicine, prednisone Please also use the Symbicort at 2 puffs twice per day until the inhaler is out (usually 2 wks) Please continue all other medications as before, including the proair  Thank you for enrolling in MyChart. Please follow the instructions below to securely access your online medical record. MyChart allows you to send messages to your doctor, view your test results, renew your prescriptions, schedule appointments, and more.

## 2012-11-15 NOTE — Assessment & Plan Note (Signed)
Mild to mod, for antibx course,  to f/u any worsening symptoms or concerns 

## 2012-11-15 NOTE — Progress Notes (Signed)
  Subjective:    Patient ID: Terri Fletcher, female    DOB: 1961/05/04, 52 y.o.   MRN: [id number]  HPI Here with acute onset mild to mod 2-3 days ST, HA, general weakness and malaise, with prod cough greenish sputum, but Pt denies chest pain, increased sob or doe, wheezing, orthopnea, PND, increased LE swelling, palpitations, dizziness or syncope, except for onset wheezing since last PM, has been using her proair inhaler frequently, usually more often less than twice per wk Denies worsening depressive symptoms, suicidal ideation, or panic; has ongoing anxiety. Past Medical History  Diagnosis Date  . ALLERGIC RHINITIS   . ANXIETY, SITUATIONAL   . ASTHMA   . COMMON MIGRAINE   . DIARRHEA, RECURRENT   . INSOMNIA, PERSISTENT   . Impaired glucose tolerance 09/25/2012   Past Surgical History  Procedure Laterality Date  . Cesarean section  06/25/1982  . Cesarean section  11/25/1978    reports that she has quit smoking. Her smoking use included Cigarettes. She smoked 0.00 packs per day. She has never used smokeless tobacco. She reports that  drinks alcohol. She reports that she uses illicit drugs (Marijuana). family history includes Cancer in her maternal grandmother and mother and Heart attack in her maternal grandfather. Allergies  Allergen Reactions  . Haldol (Haloperidol Lactate) Other (See Comments)    Lock Jaw  . Cefuroxime Axetil     REACTION: nausea   Current Outpatient Prescriptions on File Prior to Visit  Medication Sig Dispense Refill  . albuterol (PROAIR HFA) 108 (90 BASE) MCG/ACT inhaler INHALE TWO PUFFS BY MOUTH FOUR TIMES DAILY FOR WHEEZING  8.5 each  2  . ALPRAZolam (XANAX) 0.5 MG tablet Take 1 tablet (0.5 mg total) by mouth 2 (two) times daily as needed.  60 tablet  2  . zolpidem (AMBIEN) 10 MG tablet Take 1 tablet (10 mg total) by mouth at bedtime as needed for sleep.  90 tablet  1   No current facility-administered medications on file prior to visit.   Review of Systems  Constitutional: Negative for unexpected weight change, or unusual diaphoresis  HENT: Negative for tinnitus.   Eyes: Negative for photophobia and visual disturbance.  Respiratory: Negative for choking and stridor.   Gastrointestinal: Negative for vomiting and blood in stool.  Genitourinary: Negative for hematuria and decreased urine volume.  Musculoskeletal: Negative for acute joint swelling Skin: Negative for color change and wound.  Neurological: Negative for tremors and numbness other than noted  Psychiatric/Behavioral: Negative for decreased concentration or  hyperactivity.       Objective:   Physical Exam BP 140/80  Pulse 72  Temp(Src) 98.5 F (36.9 C) (Oral)  Ht 6' (1.829 m)  Wt 204 lb (92.534 kg)  BMI 27.66 kg/m2  SpO2 97% VS noted, mild ill Constitutional: Pt appears well-developed and well-nourished.  HENT: Head: NCAT.  Right Ear: External ear normal.  Left Ear: External ear normal.  Bilat tm's with mild erythema.  Max sinus areas mild tender.  Pharynx with mild erythema, no exudate Eyes: Conjunctivae and EOM are normal. Pupils are equal, round, and reactive to light.  Neck: Normal range of motion. Neck supple.  Cardiovascular: Normal rate and regular rhythm.   Pulmonary/Chest: Effort normal and breath sounds decreased with few bilat wheezes.  Neurological: Pt is alert. Not confused  Skin: Skin is warm. No erythema.  Psychiatric: Pt behavior is normal. Thought content normal. mild nervous    Assessment & Plan:

## 2012-11-15 NOTE — Assessment & Plan Note (Signed)
stable overall by history and exam, recent data reviewed with pt, and pt to continue medical treatment as before,  to f/u any worsening symptoms or concerns Lab Results  Component Value Date   WBC 9.3 09/25/2012   HGB 14.8 09/25/2012   HCT 43.6 09/25/2012   PLT 252.0 09/25/2012   GLUCOSE 116* 09/25/2012   ALT 18 09/25/2012   AST 15 09/25/2012   NA 139 09/25/2012   K 4.6 09/25/2012   CL 104 09/25/2012   CREATININE 0.7 09/25/2012   BUN 8 09/25/2012   CO2 26 09/25/2012   HGBA1C 5.9 09/26/2012

## 2012-11-15 NOTE — Telephone Encounter (Signed)
The pt had an appointment today but couldn't wait since we were behind.  She has some questions about what she can and can't do.  I told her to shower.  She has stitches under the skin that will dissolve.  The glue is peeling off and she has been clipping it.  I told her to go ahead and take it off if she wants to.  Otherwise it will fall off over 3 to 4 weeks.  Dr Derrell Lolling said no sports or activities where she will get hit in the breast for 4 weeks postop.  She can do her normal activities.  She can wear whatever clothing is comfortable and may want to wear a sports bra for added support.  I rescheduled her to come in on June 19th.

## 2012-11-15 NOTE — Assessment & Plan Note (Signed)
Mild to mod, for predpack asd,  to f/u any worsening symptoms or concerns 

## 2012-11-29 ENCOUNTER — Ambulatory Visit (INDEPENDENT_AMBULATORY_CARE_PROVIDER_SITE_OTHER)
Admission: RE | Admit: 2012-11-29 | Discharge: 2012-11-29 | Disposition: A | Discharge status: Home / Self Care | Payer: Managed Care, Other (non HMO) | Source: Ambulatory Visit | Attending: Internal Medicine | Admitting: Internal Medicine

## 2012-11-29 ENCOUNTER — Encounter: Payer: Self-pay | Admitting: Internal Medicine

## 2012-11-29 ENCOUNTER — Ambulatory Visit (INDEPENDENT_AMBULATORY_CARE_PROVIDER_SITE_OTHER): Payer: Managed Care, Other (non HMO) | Admitting: Internal Medicine

## 2012-11-29 VITALS — BP 148/100 | HR 76 | Temp 98.1°F | Resp 16 | Wt 200.0 lb

## 2012-11-29 DIAGNOSIS — J45901 Unspecified asthma with (acute) exacerbation: Secondary | ICD-10-CM

## 2012-11-29 DIAGNOSIS — J209 Acute bronchitis, unspecified: Secondary | ICD-10-CM

## 2012-11-29 DIAGNOSIS — J309 Allergic rhinitis, unspecified: Secondary | ICD-10-CM

## 2012-11-29 DIAGNOSIS — F438 Other reactions to severe stress: Secondary | ICD-10-CM

## 2012-11-29 MED ORDER — HYDROCODONE-HOMATROPINE 5-1.5 MG/5ML PO SYRP
5.0000 mL | ORAL_SOLUTION | Freq: Four times a day (QID) | ORAL | Status: DC | PRN
Start: 2012-11-29 — End: 2013-02-07

## 2012-11-29 MED ORDER — BUDESONIDE-FORMOTEROL FUMARATE 160-4.5 MCG/ACT IN AERO: 2.0000 | INHALATION_SPRAY | Freq: Two times a day (BID) | RESPIRATORY_TRACT | Status: DC

## 2012-11-29 MED ORDER — AMOXICILLIN-POT CLAVULANATE 875-125 MG PO TABS: 1.0000 | ORAL_TABLET | Freq: Two times a day (BID) | ORAL | Status: DC

## 2012-11-29 MED ORDER — PREDNISONE 10 MG PO TABS: ORAL_TABLET | ORAL | Status: DC

## 2012-11-29 MED ORDER — BENZONATATE 100 MG PO CAPS: ORAL_CAPSULE | ORAL | Status: DC

## 2012-11-29 NOTE — Patient Instructions (Addendum)
Please take all new medication as prescribed - the augmentin (generic) antibiotic, cough medicines, and prednisone A prescription for the symbicort is also given Please go to the XRAY Department in the Basement (go straight as you get off the elevator) for the x-ray testing You will be contacted by phone if any changes need to be made immediately.  Otherwise, you will receive a letter about your results with an explanation, but please check with MyChart first. Thank you for enrolling in MyChart. Please follow the instructions below to securely access your online medical record. MyChart allows you to send messages to your doctor, view your test results, renew your prescriptions, schedule appointments, and more.

## 2012-11-29 NOTE — Assessment & Plan Note (Signed)
Mild to mod, for antibx course,  to f/u any worsening symptoms or concerns, for cxr r/o pna 

## 2012-11-29 NOTE — Assessment & Plan Note (Signed)
stable overall by history and exam, recent data reviewed with pt, and pt to continue medical treatment as before,  to f/u any worsening symptoms or concerns Lab Results  Component Value Date   WBC 9.3 09/25/2012   HGB 14.8 09/25/2012   HCT 43.6 09/25/2012   PLT 252.0 09/25/2012   GLUCOSE 116* 09/25/2012   ALT 18 09/25/2012   AST 15 09/25/2012   NA 139 09/25/2012   K 4.6 09/25/2012   CL 104 09/25/2012   CREATININE 0.7 09/25/2012   BUN 8 09/25/2012   CO2 26 09/25/2012   HGBA1C 5.9 09/26/2012    

## 2012-11-29 NOTE — Progress Notes (Signed)
Subjective:    Patient ID: Terri Fletcher, female    DOB: 01-19-1961, 52 y.o.   MRN: [id number]  HPI  Here to f/u, some improved with tx last visit, but unfortunately now with similar ? Even worse with URI symtpoms, sinus pain, ST, cough, feverish, wheezing, sob;  symbicort helped but now out of the sample.  BP elevated today but has been < 140/90 elsewhere.  Pt denies chest pain, orthopnea, PND, increased LE swelling, palpitations, dizziness or syncope. Pt denies new neurological symptoms such as new headache, or facial or extremity weakness or numbness   Pt denies polydipsia, polyuria.  Has had some nasal allergy symptoms returning after prednisone done Denies worsening depressive symptoms, suicidal ideation, or panic Past Medical History  Diagnosis Date  . ALLERGIC RHINITIS   . ANXIETY, SITUATIONAL   . ASTHMA   . COMMON MIGRAINE   . DIARRHEA, RECURRENT   . INSOMNIA, PERSISTENT   . Impaired glucose tolerance 09/25/2012   Past Surgical History  Procedure Laterality Date  . Cesarean section  06/25/1982  . Cesarean section  11/25/1978    reports that she has quit smoking. Her smoking use included Cigarettes. She smoked 0.00 packs per day. She has never used smokeless tobacco. She reports that  drinks alcohol. She reports that she uses illicit drugs (Marijuana). family history includes Cancer in her maternal grandmother and mother and Heart attack in her maternal grandfather. Allergies  Allergen Reactions  . Haldol (Haloperidol Lactate) Other (See Comments)    Lock Jaw  . Cefuroxime Axetil     REACTION: nausea   Current Outpatient Prescriptions on File Prior to Visit  Medication Sig Dispense Refill  . albuterol (PROAIR HFA) 108 (90 BASE) MCG/ACT inhaler INHALE TWO PUFFS BY MOUTH FOUR TIMES DAILY FOR WHEEZING  8.5 each  2  . ALPRAZolam (XANAX) 0.5 MG tablet Take 1 tablet (0.5 mg total) by mouth 2 (two) times daily as needed.  60 tablet  2  . zolpidem (AMBIEN) 10 MG tablet Take 1 tablet (10  mg total) by mouth at bedtime as needed for sleep.  90 tablet  1   No current facility-administered medications on file prior to visit.   Review of Systems  Constitutional: Negative for unexpected weight change, or unusual diaphoresis  HENT: Negative for tinnitus.   Eyes: Negative for photophobia and visual disturbance.  Respiratory: Negative for choking and stridor.   Gastrointestinal: Negative for vomiting and blood in stool.  Genitourinary: Negative for hematuria and decreased urine volume.  Musculoskeletal: Negative for acute joint swelling Skin: Negative for color change and wound.  Neurological: Negative for tremors and numbness other than noted  Psychiatric/Behavioral: Negative for decreased concentration or  hyperactivity.       Objective:   Physical Exam BP 148/100  Pulse 76  Temp(Src) 98.1 F (36.7 C) (Oral)  Resp 16  Wt 200 lb (90.719 kg)  BMI 27.12 kg/m2 VS noted, mild ill Constitutional: Pt appears well-developed and well-nourished.  HENT: Head: NCAT.  Right Ear: External ear normal.  Left Ear: External ear normal.  Bilat tm's with mild erythema.  Max sinus areas mild tender.  Pharynx with mild erythema, no exudate Eyes: Conjunctivae and EOM are normal. Pupils are equal, round, and reactive to light.  Neck: Normal range of motion. Neck supple.  Cardiovascular: Normal rate and regular rhythm.   Pulmonary/Chest: Effort normal and breath sounds decreased bilat with few wheezes bilat.  Neurological: Pt is alert. Not confused  Skin: Skin is warm. No  erythema.  Psychiatric: Pt behavior is normal. Thought content normal. 1+ nervous    Assessment & Plan:

## 2012-11-29 NOTE — Assessment & Plan Note (Signed)
Mild to mod, for higher strength predpack asd,  to f/u any worsening symptoms or concerns, re-start symbicort with rx today

## 2012-11-29 NOTE — Assessment & Plan Note (Signed)
stable overall by history and exam, and pt to continue medical treatment as before,  to f/u any worsening symptoms or concerns 

## 2012-12-06 ENCOUNTER — Other Ambulatory Visit (INDEPENDENT_AMBULATORY_CARE_PROVIDER_SITE_OTHER): Payer: Self-pay | Admitting: General Surgery

## 2012-12-06 ENCOUNTER — Ambulatory Visit (INDEPENDENT_AMBULATORY_CARE_PROVIDER_SITE_OTHER): Payer: Managed Care, Other (non HMO) | Admitting: General Surgery

## 2012-12-06 ENCOUNTER — Encounter (INDEPENDENT_AMBULATORY_CARE_PROVIDER_SITE_OTHER): Payer: Self-pay | Admitting: General Surgery

## 2012-12-06 VITALS — BP 138/82 | HR 78 | Temp 98.6°F | Resp 18 | Ht 72.0 in | Wt 203.4 lb

## 2012-12-06 DIAGNOSIS — N63 Unspecified lump in unspecified breast: Secondary | ICD-10-CM

## 2012-12-06 DIAGNOSIS — N6320 Unspecified lump in the left breast, unspecified quadrant: Secondary | ICD-10-CM

## 2012-12-06 NOTE — Patient Instructions (Signed)
Your left breast appears to be healing from the recent surgery without any obvious surgical complication.  Your final pathology report shows fibrocystic changes but no malignancy or atypia.  You will be scheduled for bilateral mammograms in 6 months and return to see me at that time.

## 2012-12-06 NOTE — Progress Notes (Signed)
Patient ID: Terri Fletcher, female   DOB: 05-05-1961, 52 y.o.   MRN: [id number] History: This patient will underwent excision of left breast mass at 12:00 position on 11/05/2012. She is recovering uneventfully and without complaints. Pathology report showed benign fibrocystic disease and usual ductal hyperplasia. No malignancy. She is pleased and was given a copy of the pathology report. We discussed the issue of family history of breast cancer in the mother and possibly maternal grandmother. She has not had mammograms in several years and declined to have them prior to the breast biopsy. She agrees to have them done in the future  Exam: Patient is in good spirits. No distress. Left breast reveals transverse circumareolar incision superiorly. Healing well. No hematoma. No infection. Nipple areolar complex is normal  Assessment: Left breast mass, proven to be benign fibrocystic disease by excisional biopsy. Recovery uneventfully Family history breast cancer, first-line relative The patient is overdue for mammograms  Plan: We will wait 6 months for the scarring to resolve, and then she will get bilateral mammograms She'll return to see me at the mammograms to finish her evaluation. If nothing is found she will be returned to her PCP, Dr. Oliver Barre, for ammual screenuing.   Angelia Mould. Derrell Lolling, M.D., [facility] General and Minimally invasive Surgery Breast and Colorectal Surgery Office:   [phone] Pager:   [phone]

## 2012-12-13 ENCOUNTER — Other Ambulatory Visit: Payer: Self-pay | Admitting: Internal Medicine

## 2012-12-13 MED ORDER — ALPRAZOLAM 0.5 MG PO TABS: 0.5000 mg | ORAL_TABLET | Freq: Two times a day (BID) | ORAL | Status: DC | PRN

## 2012-12-13 NOTE — Telephone Encounter (Signed)
Called pt no answer LMOM rx fax to [facility].../lmb 

## 2012-12-13 NOTE — Telephone Encounter (Signed)
Done hardcopy to robin  

## 2012-12-13 NOTE — Telephone Encounter (Signed)
Pt request new rx to be send to [facility] on [address] Please advise.

## 2013-02-07 ENCOUNTER — Ambulatory Visit (INDEPENDENT_AMBULATORY_CARE_PROVIDER_SITE_OTHER): Payer: Managed Care, Other (non HMO) | Admitting: Internal Medicine

## 2013-02-07 ENCOUNTER — Encounter: Payer: Self-pay | Admitting: Internal Medicine

## 2013-02-07 VITALS — BP 152/90 | HR 96 | Temp 98.2°F | Ht 72.0 in | Wt 204.5 lb

## 2013-02-07 DIAGNOSIS — IMO0002 Reserved for concepts with insufficient information to code with codable children: Secondary | ICD-10-CM

## 2013-02-07 DIAGNOSIS — M5416 Radiculopathy, lumbar region: Secondary | ICD-10-CM

## 2013-02-07 DIAGNOSIS — R030 Elevated blood-pressure reading, without diagnosis of hypertension: Secondary | ICD-10-CM

## 2013-02-07 MED ORDER — PREDNISONE 10 MG PO TABS: 10.0000 mg | ORAL_TABLET | Freq: Every day | ORAL | Status: DC

## 2013-02-07 MED ORDER — OXYCODONE HCL 5 MG PO TABS: ORAL_TABLET | ORAL | Status: DC

## 2013-02-07 MED ORDER — DIAZEPAM 5 MG PO TABS: 5.0000 mg | ORAL_TABLET | Freq: Once | ORAL | Status: DC

## 2013-02-07 NOTE — Patient Instructions (Addendum)
Please take all new medication as prescribed - the pain medication and the prednisone, and the valium as prescribed Please continue all other medications as before, and refills have been done if requested. You will be contacted regarding the referral for: MRI and Neurosurgury referral - to see [facility] now  Please remember to sign up for My Chart if you have not done so, as this will be important to you in the future with finding out test results, communicating by private email, and scheduling acute appointments online when needed.  Please continue to monitor your  Blood Pressure on a regular basis, as you your goal is to be less than 140/90

## 2013-02-07 NOTE — Assessment & Plan Note (Signed)
Mild to mod, likely situational, to cont to monitor,  to f/u any worsening symptoms or concerns BP Readings from Last 3 Encounters:  02/07/13 152/90  12/06/12 138/82  11/29/12 148/100

## 2013-02-07 NOTE — Progress Notes (Signed)
Subjective:    Patient ID: Terri Fletcher, female    DOB: 1960/11/13, 52 y.o.   MRN: [id number]  HPI  Here to c/o progression of pain to the left side, seemed to start about first of yr with GI  And GYN evaluation, but now seems to have progressed to involve the left lower back with radiation to the left buttock, left groin and left leg below the knee, with numbness to whole leg by the end of the day which involves sitting for work most of the day.  Now has stand, walk, and lie flat on the floor to help the pain when recurs worse in the 2 wks.  No LE weakness. Denies worsening reflux, abd pain, dysphagia, n/v, bowel change or blood. Denies urinary symptoms such as dysuria, frequency, urgency, flank pain, hematuria or n/v, fever, chills. No falls. Right handed Of note, pt conts to have needle and MRI phobia which has been near disabling in the past Past Medical History  Diagnosis Date  . ALLERGIC RHINITIS   . ANXIETY, SITUATIONAL   . ASTHMA   . COMMON MIGRAINE   . DIARRHEA, RECURRENT   . INSOMNIA, PERSISTENT   . Impaired glucose tolerance 09/25/2012   Past Surgical History  Procedure Laterality Date  . Cesarean section  06/25/1982  . Cesarean section  11/25/1978  . Breast surgery  11/05/2012    excision left breast mass    reports that she has quit smoking. Her smoking use included Cigarettes. She smoked 0.00 packs per day. She has never used smokeless tobacco. She reports that  drinks alcohol. She reports that she uses illicit drugs (Marijuana). family history includes Cancer in her maternal grandmother and mother; Heart attack in her maternal grandfather. Allergies  Allergen Reactions  . Haldol [Haloperidol Lactate] Other (See Comments)    Lock Jaw  . Cefuroxime Axetil     REACTION: nausea   Current Outpatient Prescriptions on File Prior to Visit  Medication Sig Dispense Refill  . albuterol (PROAIR HFA) 108 (90 BASE) MCG/ACT inhaler INHALE TWO PUFFS BY MOUTH FOUR TIMES DAILY FOR WHEEZING   8.5 each  2  . ALPRAZolam (XANAX) 0.5 MG tablet Take 1 tablet (0.5 mg total) by mouth 2 (two) times daily as needed.  60 tablet  2  . budesonide-formoterol (SYMBICORT) 160-4.5 MCG/ACT inhaler Inhale 2 puffs into the lungs 2 (two) times daily.  1 Inhaler  3  . zolpidem (AMBIEN) 10 MG tablet Take 1 tablet (10 mg total) by mouth at bedtime as needed for sleep.  90 tablet  1   No current facility-administered medications on file prior to visit.   Review of Systems  Constitutional: Negative for unexpected weight change, or unusual diaphoresis  HENT: Negative for tinnitus.   Eyes: Negative for photophobia and visual disturbance.  Respiratory: Negative for choking and stridor.   Gastrointestinal: Negative for vomiting and blood in stool.  Genitourinary: Negative for hematuria and decreased urine volume.  Musculoskeletal: Negative for acute joint swelling Skin: Negative for color change and wound.  Neurological: Negative for tremors and numbness other than noted  Psychiatric/Behavioral: Negative for decreased concentration or  hyperactivity.       Objective:   Physical Exam BP 152/90  Pulse 96  Temp(Src) 98.2 F (36.8 C) (Oral)  Ht 6' (1.829 m)  Wt 204 lb 8 oz (92.761 kg)  BMI 27.73 kg/m2  SpO2 96% VS noted, in obvoius discomfort, cannot sit still on exam table due to pain and nervous Constitutional: Pt appears  well-developed and well-nourished.  HENT: Head: NCAT.  Right Ear: External ear normal.  Left Ear: External ear normal.  Eyes: Conjunctivae and EOM are normal. Pupils are equal, round, and reactive to light.  Neck: Normal range of motion. Neck supple.  Cardiovascular: Normal rate and regular rhythm.   Pulmonary/Chest: Effort normal and breath sounds normal.  Abd:  Soft, NT, non-distended, + BS Neurological: Pt is alert. Not confused , motor 4+/5 distal LLE only, sens decr to LT l4 distribution, reflex intact except for slight diminised left achilles Skin: Skin is warm. No  erythema.  Psychiatric: Pt behavior is normal. Thought content normal. \   Assessment & Plan:

## 2013-02-07 NOTE — Assessment & Plan Note (Signed)
New worsening, for pain control, predpack trial but given severity of pain and acuteness will ask for MRI and NS (with open MRI and valium) urgent,  to f/u any worsening symptoms or concerns

## 2013-02-11 ENCOUNTER — Telehealth: Payer: Self-pay | Admitting: Internal Medicine

## 2013-02-11 NOTE — Telephone Encounter (Signed)
Ok to let pt know, and I still think she should see NS

## 2013-02-11 NOTE — Telephone Encounter (Signed)
There really is no other approp test for her problem.  OK to see NS , as they more likely able to get insurance to pay if exam she needs the MRI

## 2013-02-11 NOTE — Telephone Encounter (Signed)
The patient did call her insurance company and they stated they would need peer to peer to approve the test or at the least offer a more conservative test.  Please advise

## 2013-02-11 NOTE — Telephone Encounter (Signed)
Informed the patient.  She is discouraged at this time as knows a NS may need a test as well.  She did wonder what was the problem with denial and did she need another test prior.

## 2013-02-11 NOTE — Telephone Encounter (Signed)
Insurance denied MRI L-Spine

## 2013-02-12 ENCOUNTER — Encounter: Payer: Self-pay | Admitting: Internal Medicine

## 2013-02-12 NOTE — Telephone Encounter (Signed)
Patient informed. 

## 2013-02-19 ENCOUNTER — Encounter: Payer: Self-pay | Admitting: Internal Medicine

## 2013-02-19 ENCOUNTER — Ambulatory Visit (INDEPENDENT_AMBULATORY_CARE_PROVIDER_SITE_OTHER): Payer: Managed Care, Other (non HMO) | Admitting: Internal Medicine

## 2013-02-19 VITALS — BP 130/80 | HR 90 | Temp 98.1°F | Ht 72.0 in | Wt 211.2 lb

## 2013-02-19 DIAGNOSIS — M5416 Radiculopathy, lumbar region: Secondary | ICD-10-CM

## 2013-02-19 DIAGNOSIS — IMO0002 Reserved for concepts with insufficient information to code with codable children: Secondary | ICD-10-CM

## 2013-02-19 NOTE — Patient Instructions (Signed)
You will be contacted regarding the referral for: orthopedic  Please continue all other medications as before

## 2013-02-19 NOTE — Assessment & Plan Note (Signed)
Ok to change referral to orthopedic

## 2013-02-19 NOTE — Progress Notes (Signed)
  Subjective:    Patient ID: Terri Fletcher, female    DOB: October 04, 1960, 52 y.o.   MRN: [id number]  HPI  Here to f/u, insurance co denied recent MRI, needs different referral to specialist who does not require MRI up front;  Has not seen ortho before.  Trying to not taking the pain medication.  No falls , and pain overall about the same.  Past Medical History  Diagnosis Date  . ALLERGIC RHINITIS   . ANXIETY, SITUATIONAL   . ASTHMA   . COMMON MIGRAINE   . DIARRHEA, RECURRENT   . INSOMNIA, PERSISTENT   . Impaired glucose tolerance 09/25/2012   Past Surgical History  Procedure Laterality Date  . Cesarean section  06/25/1982  . Cesarean section  11/25/1978  . Breast surgery  11/05/2012    excision left breast mass    reports that she has quit smoking. Her smoking use included Cigarettes. She smoked 0.00 packs per day. She has never used smokeless tobacco. She reports that  drinks alcohol. She reports that she uses illicit drugs (Marijuana). family history includes Cancer in her maternal grandmother and mother; Heart attack in her maternal grandfather. Allergies  Allergen Reactions  . Haldol [Haloperidol Lactate] Other (See Comments)    Lock Jaw  . Cefuroxime Axetil     REACTION: nausea   Current Outpatient Prescriptions on File Prior to Visit  Medication Sig Dispense Refill  . albuterol (PROAIR HFA) 108 (90 BASE) MCG/ACT inhaler INHALE TWO PUFFS BY MOUTH FOUR TIMES DAILY FOR WHEEZING  8.5 each  2  . ALPRAZolam (XANAX) 0.5 MG tablet Take 1 tablet (0.5 mg total) by mouth 2 (two) times daily as needed.  60 tablet  2  . budesonide-formoterol (SYMBICORT) 160-4.5 MCG/ACT inhaler Inhale 2 puffs into the lungs 2 (two) times daily.  1 Inhaler  3  . diazepam (VALIUM) 5 MG tablet Take 1 tablet (5 mg total) by mouth once. approx 45 min -1 hour prior to MRI  1 tablet  0  . oxyCODONE (OXY IR/ROXICODONE) 5 MG immediate release tablet 1- tabs by mouth every 6 hrs as needed for pain  60 tablet  0  .  zolpidem (AMBIEN) 10 MG tablet Take 1 tablet (10 mg total) by mouth at bedtime as needed for sleep.  90 tablet  1   No current facility-administered medications on file prior to visit.   Review of Systems All otherwise neg per pt     Objective:   Physical Exam Not done today, discussion only       Assessment & Plan:

## 2013-02-21 ENCOUNTER — Other Ambulatory Visit: Payer: Self-pay | Admitting: Sports Medicine

## 2013-02-21 DIAGNOSIS — M545 Low back pain: Secondary | ICD-10-CM

## 2013-02-28 ENCOUNTER — Telehealth: Payer: Self-pay

## 2013-02-28 ENCOUNTER — Ambulatory Visit
Admission: RE | Admit: 2013-02-28 | Discharge: 2013-02-28 | Disposition: A | Discharge status: Home / Self Care | Payer: Managed Care, Other (non HMO) | Source: Ambulatory Visit | Attending: Sports Medicine | Admitting: Sports Medicine

## 2013-02-28 DIAGNOSIS — M545 Low back pain: Secondary | ICD-10-CM

## 2013-02-28 MED ORDER — OXYCODONE HCL 5 MG PO TABS: ORAL_TABLET | ORAL | Status: DC

## 2013-02-28 NOTE — Telephone Encounter (Signed)
Done hardcopy to robin  

## 2013-02-28 NOTE — Telephone Encounter (Signed)
The patient called to inform she has been able to get her MRI approved and is scheduled tonight at 6:30.  She are scheduled to see Dr. Farris Has Sept. 15th.  She will be out of her pain medication tomorrow (oxy).  She is taking only 2 to 3 per day and would like enough to cover her until she does see Dr. Farris Has as she does not want to mask the problem, but hopefully Dr. Farris Has will be able to help her find out the cause and get some help.

## 2013-02-28 NOTE — Telephone Encounter (Signed)
Called the patient informed to pickup hardcopy at the front desk. 

## 2013-03-19 ENCOUNTER — Ambulatory Visit (INDEPENDENT_AMBULATORY_CARE_PROVIDER_SITE_OTHER): Payer: Managed Care, Other (non HMO) | Admitting: General Surgery

## 2013-03-19 ENCOUNTER — Encounter (INDEPENDENT_AMBULATORY_CARE_PROVIDER_SITE_OTHER): Payer: Self-pay | Admitting: General Surgery

## 2013-03-19 VITALS — BP 136/78 | HR 76 | Temp 98.2°F | Resp 14 | Ht 72.0 in | Wt 206.2 lb

## 2013-03-19 DIAGNOSIS — R1013 Epigastric pain: Secondary | ICD-10-CM

## 2013-03-19 NOTE — Progress Notes (Signed)
Patient ID: Terri Fletcher, female   DOB: 1960/10/20, 52 y.o.   MRN: [id number]  Chief Complaint  Patient presents with  . New Evaluation    EPNP eval hernia    HPI Terri Fletcher is a 52 y.o. female.  She returns to see me because of epigastric pain and concern that she has a hernia there.  I previously performed a left breast biopsy  and with findings of usual ductal hyperplasia. She recovered from that. She's been evaluated recently by Dr. Oliver Barre and Dr. Pati Gallo for left hip pain, back pain, and left leg pain. She had MRI which showed degenerative disc disease. She had an ultrasound, according to her it showed gallstones but was otherwise normal. She says she saw Dr. Sheryn Bison for GI.  She gives a two-month history of pain and a sensation of a lump in the mid epigastrium. It is not the umbilicus that hurts.  HPI  Past Medical History  Diagnosis Date  . ALLERGIC RHINITIS   . ANXIETY, SITUATIONAL   . ASTHMA   . COMMON MIGRAINE   . DIARRHEA, RECURRENT   . INSOMNIA, PERSISTENT   . Impaired glucose tolerance 09/25/2012    Past Surgical History  Procedure Laterality Date  . Cesarean section  06/25/1982  . Cesarean section  11/25/1978  . Breast surgery  11/05/2012    excision left breast mass    Family History  Problem Relation Age of Onset  . Cancer Mother     Breast  . Cancer Maternal Grandmother   . Heart attack Maternal Grandfather     Social History History  Substance Use Topics  . Smoking status: Former Smoker    Types: Cigarettes  . Smokeless tobacco: Never Used  . Alcohol Use: Yes     Comment: Occasional.    Allergies  Allergen Reactions  . Haldol [Haloperidol Lactate] Other (See Comments)    Lock Jaw  . Cefuroxime Axetil     REACTION: nausea    Current Outpatient Prescriptions  Medication Sig Dispense Refill  . albuterol (PROAIR HFA) 108 (90 BASE) MCG/ACT inhaler INHALE TWO PUFFS BY MOUTH FOUR TIMES DAILY FOR WHEEZING  8.5 each  2  .  ALPRAZolam (XANAX) 0.5 MG tablet Take 1 tablet (0.5 mg total) by mouth 2 (two) times daily as needed.  60 tablet  2   No current facility-administered medications for this visit.    Review of Systems Review of Systems  Constitutional: Negative for fever, chills and unexpected weight change.       Quit smoking 2 months ago  HENT: Negative for hearing loss, congestion, sore throat, trouble swallowing and voice change.   Eyes: Negative for visual disturbance.  Respiratory: Negative for cough and wheezing.   Cardiovascular: Negative for chest pain, palpitations and leg swelling.  Gastrointestinal: Positive for abdominal pain. Negative for nausea, vomiting, diarrhea, constipation, blood in stool, abdominal distention and anal bleeding.  Genitourinary: Negative for hematuria, vaginal bleeding and difficulty urinating.  Musculoskeletal: Positive for back pain and arthralgias.  Skin: Negative for rash and wound.  Neurological: Negative for seizures, syncope and headaches.  Hematological: Negative for adenopathy. Does not bruise/bleed easily.  Psychiatric/Behavioral: Negative for confusion. The patient is nervous/anxious.     Blood pressure 136/78, pulse 76, temperature 98.2 F (36.8 C), temperature source Temporal, resp. rate 14, height 6' (1.829 m), weight 206 lb 3.2 oz (93.532 kg).  Physical Exam Physical Exam  Constitutional: She is oriented to person, place, and time. She appears well-developed  and well-nourished. No distress.  HENT:  Head: Normocephalic and atraumatic.  Nose: Nose normal.  Mouth/Throat: No oropharyngeal exudate.  Eyes: Conjunctivae and EOM are normal. Pupils are equal, round, and reactive to light. Left eye exhibits no discharge. No scleral icterus.  Neck: Neck supple. No JVD present. No tracheal deviation present. No thyromegaly present.  Cardiovascular: Normal rate, regular rhythm, normal heart sounds and intact distal pulses.   No murmur heard. Pulmonary/Chest:  Effort normal and breath sounds normal. No respiratory distress. She has no wheezes. She has no rales. She exhibits no tenderness.  Abdominal: Soft. Bowel sounds are normal. She exhibits no distension and no mass. There is no tenderness. There is no rebound and no guarding.  Tender in the mid epigastrium about 8 cm above the umbilicus. This examined supine with leg lift and Valsalva, standing, I cannot clearly demonstrate a bulge or hernia. Small umbilical hernia, reducible, nontender. No evidence of inguinal hernia.  Musculoskeletal: She exhibits no edema and no tenderness.  Lymphadenopathy:    She has no cervical adenopathy.  Neurological: She is alert and oriented to person, place, and time. She exhibits normal muscle tone. Coordination normal.  Skin: Skin is warm. No rash noted. She is not diaphoretic. No erythema. No pallor.  Psychiatric: She has a normal mood and affect. Her behavior is normal. Judgment and thought content normal.    Data Reviewed My old records.  Assessment    Epigastric pain and tenderness. This seems more likely to be of an abdominal wall source, less likely visceral. Occult epigastric hernia is possible.  Umbilical hernia, this seems somewhat remote from the location of her pain and it is less likely to be the etiology of her pain  Left lower quadrant pain. This sounds more like a musculoskeletal issue from her degenerative disc disease. Followed by Dr. Oliver Barre and Dr. Pati Gallo.  Gallstones on ultrasound, asymptomatic currently.  History left breast biopsy for usual ductal hyperplasia. These annual screening mammogram and exam  Anxiety  Family history breast cancer in the mother  Asthma  History cesarean section    Plan    Scheduled for CT scan of abdomen and pelvis to make sure we're not missing another cause of her epigastric pain and to be sure we"re not missing an epigastric hernia  Return to see me after the CT scan is done.         Angelia Mould. Derrell Lolling, M.D., [facility] General and Minimally invasive Surgery Breast and Colorectal Surgery Office:   [phone] Pager:   [phone]  03/19/2013, 11:28 AM

## 2013-03-19 NOTE — Patient Instructions (Addendum)
Your physical exam reveals tenderness in the mid epigastric region of your abdomen, but I do not clearly feel a hernia there.  You do have a small umbilical hernia, but that does not appear to be the source of your pain.  Your back pain, left hip pain, and left leg pain are probably due to your degenerative disc disease.  To evaluate your epigastric abdominal pain, you'll be scheduled for a CT scan of the abdomen and pelvis.  Return to see Dr. Derrell Lolling after the CT scan is done.

## 2013-03-20 ENCOUNTER — Telehealth (INDEPENDENT_AMBULATORY_CARE_PROVIDER_SITE_OTHER): Payer: Self-pay

## 2013-03-20 ENCOUNTER — Ambulatory Visit (INDEPENDENT_AMBULATORY_CARE_PROVIDER_SITE_OTHER): Payer: Managed Care, Other (non HMO) | Admitting: Internal Medicine

## 2013-03-20 ENCOUNTER — Ambulatory Visit
Admission: RE | Admit: 2013-03-20 | Discharge: 2013-03-20 | Disposition: A | Discharge status: Home / Self Care | Payer: Managed Care, Other (non HMO) | Source: Ambulatory Visit | Attending: General Surgery | Admitting: General Surgery

## 2013-03-20 ENCOUNTER — Other Ambulatory Visit (INDEPENDENT_AMBULATORY_CARE_PROVIDER_SITE_OTHER): Payer: Self-pay | Admitting: *Deleted

## 2013-03-20 ENCOUNTER — Encounter: Payer: Self-pay | Admitting: Internal Medicine

## 2013-03-20 ENCOUNTER — Telehealth: Payer: Self-pay | Admitting: Internal Medicine

## 2013-03-20 VITALS — BP 130/90 | HR 83 | Temp 98.4°F | Ht 72.0 in | Wt 206.4 lb

## 2013-03-20 DIAGNOSIS — R062 Wheezing: Secondary | ICD-10-CM

## 2013-03-20 DIAGNOSIS — R1013 Epigastric pain: Secondary | ICD-10-CM

## 2013-03-20 DIAGNOSIS — R030 Elevated blood-pressure reading, without diagnosis of hypertension: Secondary | ICD-10-CM

## 2013-03-20 DIAGNOSIS — J209 Acute bronchitis, unspecified: Secondary | ICD-10-CM | POA: Insufficient documentation

## 2013-03-20 MED ORDER — LEVOFLOXACIN 250 MG PO TABS: 250.0000 mg | ORAL_TABLET | Freq: Every day | ORAL | Status: DC

## 2013-03-20 MED ORDER — HYDROCOD POLST-CHLORPHEN POLST 10-8 MG/5ML PO LQCR: 5.0000 mL | Freq: Two times a day (BID) | ORAL | Status: DC | PRN

## 2013-03-20 MED ORDER — ZOLPIDEM TARTRATE 10 MG PO TABS: 10.0000 mg | ORAL_TABLET | Freq: Every evening | ORAL | Status: DC | PRN

## 2013-03-20 MED ORDER — PREDNISONE 10 MG PO TABS: ORAL_TABLET | ORAL | Status: DC

## 2013-03-20 NOTE — Telephone Encounter (Signed)
Done hardcopy to robin  

## 2013-03-20 NOTE — Telephone Encounter (Signed)
Pre-cert obtained at this time.  Patient is heading back up to GI at this time.  Called Carney Bern to update her that Pre-cert has been obtained for CT Abd/Pelvis without.

## 2013-03-20 NOTE — Telephone Encounter (Signed)
I called [facility] back and spoke to [place].  She states the pt is gone.  She refused the test and left mad.

## 2013-03-20 NOTE — Telephone Encounter (Signed)
Patient called into the office to state that she will not do a CT that requires an IV.  Spoke to Dr. Derrell Lolling who approved changing the CT order to Without instead however this will not give a view of internal organs so something could be missed.  He states this will show however any abdominal hernias and abdominal wall.  Patient updated with this information and is agreeable to CT change.  New order entered and we are awaiting Pre-Cert to be done so patient can go back to [facility] to have new CT done.  Explained to Carney Bern at GI that I will call her as soon as the pre-cert is complete.  Patient states she has another appt this afternoon so she will wait to hear when the pre-cert is done then determine if she still has time to have CT done today.  Awaiting pre-cert at this time.

## 2013-03-20 NOTE — Patient Instructions (Signed)
Please take all new medication as prescribed - the antibiotic, cough med, and prednisone Please continue all other medications as before, and refills have been done if requested.  Please remember to sign up for My Chart if you have not done so, as this will be important to you in the future with finding out test results, communicating by private email, and scheduling acute appointments online when needed.

## 2013-03-20 NOTE — Progress Notes (Signed)
  Subjective:    Patient ID: Terri Fletcher, female    DOB: 01/23/1961, 52 y.o.   MRN: [id number]  HPI   Here with acute onset mild to mod 2-3 days ST, HA, general weakness and malaise, with prod cough greenish sputum, but Pt denies chest pain, increased sob or doe, wheezing, orthopnea, PND, increased LE swelling, palpitations, dizziness or syncope, except for onset wheezing last PM with mild sob.  Pt denies new neurological symptoms such as new headache, or facial or extremity weakness or numbness   Pt denies polydipsia, polyuria.  Past Medical History  Diagnosis Date  . ALLERGIC RHINITIS   . ANXIETY, SITUATIONAL   . ASTHMA   . COMMON MIGRAINE   . DIARRHEA, RECURRENT   . INSOMNIA, PERSISTENT   . Impaired glucose tolerance 09/25/2012   Past Surgical History  Procedure Laterality Date  . Cesarean section  06/25/1982  . Cesarean section  11/25/1978  . Breast surgery  11/05/2012    excision left breast mass    reports that she has quit smoking. Her smoking use included Cigarettes. She smoked 0.00 packs per day. She has never used smokeless tobacco. She reports that  drinks alcohol. She reports that she does not use illicit drugs. family history includes Cancer in her maternal grandmother and mother; Heart attack in her maternal grandfather. Allergies  Allergen Reactions  . Haldol [Haloperidol Lactate] Other (See Comments)    Lock Jaw  . Cefuroxime Axetil     REACTION: nausea   Current Outpatient Prescriptions on File Prior to Visit  Medication Sig Dispense Refill  . albuterol (PROAIR HFA) 108 (90 BASE) MCG/ACT inhaler INHALE TWO PUFFS BY MOUTH FOUR TIMES DAILY FOR WHEEZING  8.5 each  2  . ALPRAZolam (XANAX) 0.5 MG tablet Take 1 tablet (0.5 mg total) by mouth 2 (two) times daily as needed.  60 tablet  2   No current facility-administered medications on file prior to visit.   Review of Systems  Constitutional: Negative for unexpected weight change, or unusual diaphoresis  HENT: Negative  for tinnitus.   Eyes: Negative for photophobia and visual disturbance.  Respiratory: Negative for choking and stridor.   Gastrointestinal: Negative for vomiting and blood in stool.  Genitourinary: Negative for hematuria and decreased urine volume.  Musculoskeletal: Negative for acute joint swelling Skin: Negative for color change and wound.  Neurological: Negative for tremors and numbness other than noted  Psychiatric/Behavioral: Negative for decreased concentration or  hyperactivity.       Objective:   Physical Exam BP 130/90  Pulse 83  Temp(Src) 98.4 F (36.9 C) (Oral)  Ht 6' (1.829 m)  Wt 206 lb 6 oz (93.611 kg)  BMI 27.98 kg/m2  SpO2 95% VS noted, mild ill Constitutional: Pt appears well-developed and well-nourished.  HENT: Head: NCAT.  Right Ear: External ear normal.  Left Ear: External ear normal.  Bilat tm's with mild erythema.  Max sinus areas non tender.  Pharynx with mild erythema, no exudate Eyes: Conjunctivae and EOM are normal. Pupils are equal, round, and reactive to light.  Neck: Normal range of motion. Neck supple.  Cardiovascular: Normal rate and regular rhythm.   Pulmonary/Chest: Effort normal and breath sounds decreased with bilat mild wheezes.  Neurological: Pt is alert. Not confused  Skin: Skin is warm. No erythema.  Psychiatric: Pt behavior is normal. Thought content normal.     Assessment & Plan:

## 2013-03-20 NOTE — Telephone Encounter (Signed)
Lurena Joiner with [facility] called to get Dr. Jacinto Halim advise.  Pt there, already drank both bottles of contrast, however refuses to have IV.  When Lurena Joiner went over the procedure, pt stated "Let's just cancel all of this" once the work IV was brought up.  Lurena Joiner just needs some kind of guidance as to what to do.  Please call back [phone].

## 2013-03-20 NOTE — Telephone Encounter (Signed)
Message copied by Corwin Levins on Wed Mar 20, 2013  7:17 PM ------      Message from: Scharlene Gloss B      Created: Wed Mar 20, 2013  5:50 PM       The patient would like a refill for her zolpidem sent to her mail order ------

## 2013-03-21 NOTE — Telephone Encounter (Signed)
Faxed hardcopy to pharmacy. 

## 2013-03-24 NOTE — Assessment & Plan Note (Signed)
/  Mild to mod, for predpac asd,  to f/u any worsening symptoms or concerns 

## 2013-03-24 NOTE — Assessment & Plan Note (Signed)
Mild to mod, for antibx course,  to f/u any worsening symptoms or concerns 

## 2013-03-24 NOTE — Assessment & Plan Note (Signed)
Borderline today, declines further tx BP Readings from Last 3 Encounters:  03/20/13 130/90  03/19/13 136/78  02/19/13 130/80

## 2013-03-26 ENCOUNTER — Encounter (INDEPENDENT_AMBULATORY_CARE_PROVIDER_SITE_OTHER): Payer: Self-pay | Admitting: General Surgery

## 2013-04-03 ENCOUNTER — Encounter: Payer: Self-pay | Admitting: Internal Medicine

## 2013-04-03 MED ORDER — HYDROCOD POLST-CHLORPHEN POLST 10-8 MG/5ML PO LQCR: 5.0000 mL | Freq: Two times a day (BID) | ORAL | Status: DC | PRN

## 2013-04-03 NOTE — Telephone Encounter (Signed)
Done hardcopy to robin  

## 2013-04-25 ENCOUNTER — Encounter (INDEPENDENT_AMBULATORY_CARE_PROVIDER_SITE_OTHER): Payer: Self-pay | Admitting: General Surgery

## 2013-04-25 ENCOUNTER — Other Ambulatory Visit: Payer: Self-pay

## 2013-04-25 ENCOUNTER — Ambulatory Visit (INDEPENDENT_AMBULATORY_CARE_PROVIDER_SITE_OTHER): Payer: Managed Care, Other (non HMO) | Admitting: General Surgery

## 2013-04-25 VITALS — BP 136/80 | HR 72 | Temp 99.0°F | Resp 15 | Ht 72.0 in | Wt 205.4 lb

## 2013-04-25 DIAGNOSIS — R1013 Epigastric pain: Secondary | ICD-10-CM

## 2013-04-25 NOTE — Patient Instructions (Signed)
The CT scan shows no significant abnormalities. Specifically your liver and gallbladder, stomach and pancreas looked normal. The abdominal wall is intact. Other than a tiny umbilical hernia there is no other abnormality.  Since your  pain has significantly improved with the alteration in your office chair, I suspect that this is a musculoskeletal pain that will be more self-limited. There is no indication for surgical intervention.  Return to see Dr. Derrell Lolling as needed.

## 2013-04-25 NOTE — Progress Notes (Signed)
Patient ID: Terri Fletcher, female   DOB: Oct 19, 1960, 52 y.o.   MRN: [id number]  History: Disparate returns for followup evaluation of her epigastric pain. She states that she changed her office chair and got a better chair with better support and she says the pain is almost completely resolved. No alteration in GI habits. Excellent appetite. Normal bowel function. We know that she has degenerative disc disease. CT scan shows no hernia, other than a tiny umbilical hernia containing some preperitoneal fat. No epigastric hernia. No internal abnormality of the viscera.  ROS: 10 system review of systems is negative except as described above  Family history, social history, past history are unchanged  Exam: Patient looks well. No distress.   abdomen soft and nontender. Tiny umbilical hernia, nontender. No epigastric pain. No epigastric mass.  Assessment  epigastric pain, rapidly resolving with change in posture posture and body mechanics. Suspect musculoskeletal cause Asymptomatic umbilical hernia. No indication for surgical intervention at this point Asymptomatic gallstones History left breast biopsy for usual ductal hyperplasia. Anxiety Family history breast cancer in mother History cesarean section Asthma  Plan: patient was reassured Return to see me as needed.   Angelia Mould. Derrell Lolling, M.D., [facility] General and Minimally invasive Surgery Breast and Colorectal Surgery Office:   [phone] Pager:   [phone]

## 2013-05-09 ENCOUNTER — Other Ambulatory Visit: Payer: Self-pay

## 2013-05-09 ENCOUNTER — Encounter: Payer: Self-pay | Admitting: Internal Medicine

## 2013-05-09 MED ORDER — ALPRAZOLAM 0.5 MG PO TABS: 0.5000 mg | ORAL_TABLET | Freq: Two times a day (BID) | ORAL | Status: DC | PRN

## 2013-05-09 NOTE — Telephone Encounter (Signed)
Done hardcopy to robin  

## 2013-05-09 NOTE — Telephone Encounter (Signed)
Faxed hardcopy of Alprazolam 0.5 mg #60 with 2 refills to [facility] [address] [place].  Patient request from email.

## 2013-05-14 ENCOUNTER — Encounter: Payer: Self-pay | Admitting: Internal Medicine

## 2013-05-14 ENCOUNTER — Ambulatory Visit (INDEPENDENT_AMBULATORY_CARE_PROVIDER_SITE_OTHER): Payer: Managed Care, Other (non HMO) | Admitting: Internal Medicine

## 2013-05-14 VITALS — BP 140/86 | HR 107 | Temp 99.0°F | Wt 210.0 lb

## 2013-05-14 DIAGNOSIS — J309 Allergic rhinitis, unspecified: Secondary | ICD-10-CM

## 2013-05-14 DIAGNOSIS — J019 Acute sinusitis, unspecified: Secondary | ICD-10-CM

## 2013-05-14 DIAGNOSIS — R030 Elevated blood-pressure reading, without diagnosis of hypertension: Secondary | ICD-10-CM

## 2013-05-14 MED ORDER — LEVOFLOXACIN 250 MG PO TABS: 250.0000 mg | ORAL_TABLET | Freq: Every day | ORAL | Status: DC

## 2013-05-14 NOTE — Progress Notes (Signed)
Subjective:    Patient ID: Terri Fletcher, female    DOB: 05-10-1961, 52 y.o.   MRN: [id number]  HPI  Here with 2-3 days acute onset fever, facial pain, pressure, headache, general weakness and malaise, and greenish d/c, with mild ST and cough, but pt denies chest pain, wheezing, increased sob or doe, orthopnea, PND, increased LE swelling, palpitations, dizziness or syncope.  Does have several wks ongoing nasal allergy symptoms with clearish congestion, itch and sneezing, without fever, pain, ST, cough, swelling or wheezing, Pt denies new neurological symptoms such as new headache, or facial or extremity weakness or numbness   Pt denies polydipsia, polyuria.  Past Medical History  Diagnosis Date  . ALLERGIC RHINITIS   . ANXIETY, SITUATIONAL   . ASTHMA   . COMMON MIGRAINE   . DIARRHEA, RECURRENT   . INSOMNIA, PERSISTENT   . Impaired glucose tolerance 09/25/2012   Past Surgical History  Procedure Laterality Date  . Cesarean section  06/25/1982  . Cesarean section  11/25/1978  . Breast surgery  11/05/2012    excision left breast mass    reports that she has quit smoking. Her smoking use included Cigarettes. She smoked 0.00 packs per day. She has never used smokeless tobacco. She reports that she drinks alcohol. She reports that she does not use illicit drugs. family history includes Cancer in her maternal grandmother and mother; Heart attack in her maternal grandfather. Allergies  Allergen Reactions  . Haldol [Haloperidol Lactate] Other (See Comments)    Lock Jaw  . Cefuroxime Axetil     REACTION: nausea   Current Outpatient Prescriptions on File Prior to Visit  Medication Sig Dispense Refill  . albuterol (PROAIR HFA) 108 (90 BASE) MCG/ACT inhaler INHALE TWO PUFFS BY MOUTH FOUR TIMES DAILY FOR WHEEZING  8.5 each  2  . ALPRAZolam (XANAX) 0.5 MG tablet Take 1 tablet (0.5 mg total) by mouth 2 (two) times daily as needed.  60 tablet  2  . zolpidem (AMBIEN) 10 MG tablet Take 10 mg by mouth at  bedtime as needed for sleep.       No current facility-administered medications on file prior to visit.    Review of Systems  Constitutional: Negative for unexpected weight change, or unusual diaphoresis  HENT: Negative for tinnitus.   Eyes: Negative for photophobia and visual disturbance.  Respiratory: Negative for choking and stridor.   Gastrointestinal: Negative for vomiting and blood in stool.  Genitourinary: Negative for hematuria and decreased urine volume.  Musculoskeletal: Negative for acute joint swelling Skin: Negative for color change and wound.  Neurological: Negative for tremors and numbness other than noted  Psychiatric/Behavioral: Negative for decreased concentration or  hyperactivity.       Objective:   Physical Exam BP 140/86  Pulse 107  Temp(Src) 99 F (37.2 C) (Oral)  Wt 210 lb (95.255 kg)  SpO2 98% VS noted,  Constitutional: Pt appears well-developed and well-nourished.  HENT: Head: NCAT.  Right Ear: External ear normal.  Left Ear: External ear normal.  Bilat tm's with mild erythema.  Max sinus areas mod to severe  Tender left > right.  Pharynx with mild erythema, no exudate Eyes: Conjunctivae and EOM are normal. Pupils are equal, round, and reactive to light.  Neck: Normal range of motion. Neck supple.  Cardiovascular: Normal rate and regular rhythm.   Pulmonary/Chest: Effort normal and breath sounds normal.  Neurological: Pt is alert. Not confused  Skin: Skin is warm. No erythema.  Psychiatric: Pt behavior is normal. Thought  content normal.     Assessment & Plan:

## 2013-05-14 NOTE — Assessment & Plan Note (Signed)
Mild to mod, for antibx course,  to f/u any worsening symptoms or concerns 

## 2013-05-14 NOTE — Assessment & Plan Note (Signed)
Pt prefers otc allegra, declines flonase or similar at this time,  to f/u any worsening symptoms or concerns

## 2013-05-14 NOTE — Assessment & Plan Note (Signed)
D/w pt., she is a "salt o holic" per pt and will try to reduce intake, more exercise, wt loss , and ETOH in moderation BP Readings from Last 3 Encounters:  05/14/13 140/86  04/25/13 136/80  03/20/13 130/90

## 2013-05-14 NOTE — Patient Instructions (Signed)
Please take all new medication as prescribed Please continue all other medications as before, and refills have been done if requested. Please have the pharmacy call with any other refills you may need.  Please remember to sign up for My Chart if you have not done so, as this will be important to you in the future with finding out test results, communicating by private email, and scheduling acute appointments online when needed.   

## 2013-05-23 ENCOUNTER — Telehealth (INDEPENDENT_AMBULATORY_CARE_PROVIDER_SITE_OTHER): Payer: Self-pay | Admitting: *Deleted

## 2013-05-23 NOTE — Telephone Encounter (Signed)
I spoke with patient trying to find out when and where her last mammogram was so I could schedule her at the breast center for her MM that is due this month.  Pt states she has not had a mammogram done in probably 30 years and she is not quite ready to schedule this just yet.  She states she is going to give it more thought and will call to schedule when she is ready.

## 2013-06-06 ENCOUNTER — Encounter (INDEPENDENT_AMBULATORY_CARE_PROVIDER_SITE_OTHER): Payer: Managed Care, Other (non HMO) | Admitting: General Surgery

## 2013-07-11 ENCOUNTER — Encounter: Payer: Self-pay | Admitting: Internal Medicine

## 2013-07-11 MED ORDER — ZOLPIDEM TARTRATE 10 MG PO TABS: 10.0000 mg | ORAL_TABLET | Freq: Every evening | ORAL | Status: DC | PRN

## 2013-07-11 NOTE — Telephone Encounter (Signed)
Done hardcopy to robin  Robin to see above 

## 2013-09-13 ENCOUNTER — Other Ambulatory Visit: Payer: Self-pay | Admitting: Internal Medicine

## 2013-09-13 ENCOUNTER — Encounter: Payer: Self-pay | Admitting: Internal Medicine

## 2013-09-13 ENCOUNTER — Other Ambulatory Visit: Payer: Self-pay

## 2013-09-13 MED ORDER — ALBUTEROL SULFATE HFA 108 (90 BASE) MCG/ACT IN AERS: INHALATION_SPRAY | RESPIRATORY_TRACT | Status: DC

## 2013-09-13 MED ORDER — ALPRAZOLAM 0.5 MG PO TABS: 0.5000 mg | ORAL_TABLET | Freq: Two times a day (BID) | ORAL | Status: DC | PRN

## 2013-09-13 NOTE — Telephone Encounter (Signed)
proair done erx  Xanax [facility]

## 2013-09-16 ENCOUNTER — Telehealth: Payer: Self-pay | Admitting: Internal Medicine

## 2013-09-16 NOTE — Telephone Encounter (Signed)
Patient came in to pick up rx for xanax.  Did not see in cabinet.

## 2013-09-16 NOTE — Telephone Encounter (Signed)
Rx printed and faxed to the pharmacy.//AB/CMA 

## 2013-09-17 MED ORDER — ALPRAZOLAM 0.5 MG PO TABS: 0.5000 mg | ORAL_TABLET | Freq: Two times a day (BID) | ORAL | Status: DC | PRN

## 2013-09-17 NOTE — Telephone Encounter (Signed)
Done hardcopy to robin  

## 2013-09-17 NOTE — Telephone Encounter (Signed)
[facility] [address] [facility] Alprazolam 0.5 mg #60 with 2 RF's.  Called the patient to inform no answer, but did leave a detailed message rx being faxed in.

## 2013-10-15 ENCOUNTER — Encounter: Payer: Self-pay | Admitting: Family Medicine

## 2013-10-15 ENCOUNTER — Ambulatory Visit (INDEPENDENT_AMBULATORY_CARE_PROVIDER_SITE_OTHER): Payer: Managed Care, Other (non HMO) | Admitting: Family Medicine

## 2013-10-15 VITALS — BP 144/82 | HR 63 | Temp 97.8°F | Ht 71.5 in | Wt 209.8 lb

## 2013-10-15 DIAGNOSIS — K047 Periapical abscess without sinus: Secondary | ICD-10-CM | POA: Insufficient documentation

## 2013-10-15 DIAGNOSIS — J019 Acute sinusitis, unspecified: Secondary | ICD-10-CM

## 2013-10-15 MED ORDER — AMOXICILLIN-POT CLAVULANATE 875-125 MG PO TABS: 1.0000 | ORAL_TABLET | Freq: Two times a day (BID) | ORAL | Status: AC

## 2013-10-15 NOTE — Progress Notes (Signed)
Pre visit review using our clinic review tool, if applicable. No additional management support is needed unless otherwise documented below in the visit note. 

## 2013-10-15 NOTE — Progress Notes (Signed)
SUBJECTIVE:  Terri MaduroSusan Ramella is a 53 y.o. female pt of Dr. Jonny RuizJohn, new to me, who complains of coryza, congestion and left sinus pain for 10 days. She also has a broken tooth on left.  Left side of face more painful than right.   She denies a history of chills, fevers and vomiting and has a history of asthma. Has had to use albuterol inhaler more frequently- last used it yesterday.   Patient denies smoke cigarettes.   Patient Active Problem List   Diagnosis Date Noted  . Acute sinus infection 05/14/2013  . Abdominal pain, epigastric 03/19/2013  . Left lumbar radiculopathy 02/07/2013  . Elevated blood pressure reading without diagnosis of hypertension 02/07/2013  . Left breast mass 12/06/2012  . Hematochezia 10/09/2012  . Breast abscess of female 09/25/2012  . Abdominal pain, left lower quadrant 09/25/2012  . Frequency of urination 09/05/2012  . Preventative health care 05/30/2011  . ANXIETY, SITUATIONAL 03/01/2010  . COMMON MIGRAINE 03/01/2010  . DIARRHEA, RECURRENT 03/01/2010  . INSOMNIA, PERSISTENT 01/04/2008  . ALLERGIC RHINITIS 05/21/2007  . ASTHMA 03/04/2007   Past Medical History  Diagnosis Date  . ALLERGIC RHINITIS   . ANXIETY, SITUATIONAL   . ASTHMA   . COMMON MIGRAINE   . DIARRHEA, RECURRENT   . INSOMNIA, PERSISTENT   . Impaired glucose tolerance 09/25/2012   Past Surgical History  Procedure Laterality Date  . Cesarean section  06/25/1982  . Cesarean section  11/25/1978  . Breast surgery  11/05/2012    excision left breast mass   History  Substance Use Topics  . Smoking status: Former Smoker    Types: Cigarettes  . Smokeless tobacco: Never Used  . Alcohol Use: Yes     Comment: Occasional.   Family History  Problem Relation Age of Onset  . Cancer Mother     Breast  . Cancer Maternal Grandmother   . Heart attack Maternal Grandfather    Allergies  Allergen Reactions  . Haldol [Haloperidol Lactate] Other (See Comments)    Lock Jaw  . Cefuroxime Axetil    REACTION: nausea   Current Outpatient Prescriptions on File Prior to Visit  Medication Sig Dispense Refill  . albuterol (PROAIR HFA) 108 (90 BASE) MCG/ACT inhaler INHALE TWO PUFFS BY MOUTH FOUR TIMES DAILY FOR WHEEZING  8.5 each  11  . ALPRAZolam (XANAX) 0.5 MG tablet Take 1 tablet (0.5 mg total) by mouth 2 (two) times daily as needed.  60 tablet  2  . zolpidem (AMBIEN) 10 MG tablet Take 1 tablet (10 mg total) by mouth at bedtime as needed for sleep.  90 tablet  1   No current facility-administered medications on file prior to visit.   The PMH, PSH, Social History, Family History, Medications, and allergies [facility], and have been updated if relevant.  OBJECTIVE: BP 144/82  Pulse 63  Temp(Src) 97.8 F (36.6 C) (Oral)  Ht 5' 11.5" (1.816 m)  Wt 209 lb 12 oz (95.142 kg)  BMI 28.85 kg/m2  SpO2 97%  She appears well, vital signs are as noted. Ears normal.  Throat and pharynx normal.   Left upper molar broken with swelling around gum Neck supple. No adenopathy in the neck. Nose is congested. Sinuses tender. The chest is clear, without wheezes or rales.   symptoms persist or worsen"}. Lack of antibiotic effectiveness discussed with her. Call or return to clinic prn if these symptoms worsen or fail to improve as anticipated.

## 2013-10-15 NOTE — Patient Instructions (Signed)
Take Augmentin as directed- 1 tab twice daily x 10 days.  Drink lots of fluids.  Keep your appointment with your dentist.  Treat sympotmatically with  Tylenol/Ibuprofen.  Try over the counter nasocort-start with 2 sprays per nostril per day...and then try to taper to 1 spray per nostril once symptoms improve.   You can use warm compresses.     Call if not improving as expected in 5-7 days.

## 2013-10-15 NOTE — Assessment & Plan Note (Signed)
Given duration and progression of symptoms, will treat for bacterial sinusitis with Augmentin, nasocort. See AVS.

## 2013-10-15 NOTE — Assessment & Plan Note (Signed)
Augmentin should cover this and she has appt with dentist which she will keep. The patient indicates understanding of these issues and agrees with the plan.

## 2013-12-25 ENCOUNTER — Encounter: Payer: Self-pay | Admitting: Internal Medicine

## 2013-12-25 MED ORDER — ZOLPIDEM TARTRATE 10 MG PO TABS: 10.0000 mg | ORAL_TABLET | Freq: Every evening | ORAL | Status: DC | PRN

## 2013-12-25 NOTE — Telephone Encounter (Signed)
Done hardcopy to Terri Fletcher  Terri Fletcher to see above

## 2013-12-26 ENCOUNTER — Other Ambulatory Visit: Payer: Self-pay

## 2013-12-26 NOTE — Telephone Encounter (Signed)
Faxed hardcopy for zolpidem to [facility] as requested pts. email.

## 2014-01-03 ENCOUNTER — Encounter: Payer: Self-pay | Admitting: Internal Medicine

## 2014-02-19 ENCOUNTER — Encounter: Payer: Self-pay | Admitting: Internal Medicine

## 2014-02-19 MED ORDER — ALPRAZOLAM 0.5 MG PO TABS: 0.5000 mg | ORAL_TABLET | Freq: Two times a day (BID) | ORAL | Status: DC | PRN

## 2014-02-19 NOTE — Telephone Encounter (Signed)
Done hardcopy to robin  

## 2014-02-20 ENCOUNTER — Other Ambulatory Visit: Payer: Self-pay

## 2014-02-20 NOTE — Telephone Encounter (Signed)
Faxed hardcopy for Alprazolam as requested from the patient.  Faxed it to [facility].

## 2014-03-04 ENCOUNTER — Ambulatory Visit (INDEPENDENT_AMBULATORY_CARE_PROVIDER_SITE_OTHER): Payer: Managed Care, Other (non HMO) | Admitting: Internal Medicine

## 2014-03-04 ENCOUNTER — Encounter: Payer: Self-pay | Admitting: Internal Medicine

## 2014-03-04 VITALS — BP 122/70 | HR 75 | Temp 98.2°F | Wt 215.4 lb

## 2014-03-04 DIAGNOSIS — J209 Acute bronchitis, unspecified: Secondary | ICD-10-CM

## 2014-03-04 DIAGNOSIS — J45901 Unspecified asthma with (acute) exacerbation: Secondary | ICD-10-CM

## 2014-03-04 MED ORDER — AZITHROMYCIN 250 MG PO TABS: ORAL_TABLET | ORAL | Status: DC

## 2014-03-04 MED ORDER — HYDROCOD POLST-CHLORPHEN POLST 10-8 MG/5ML PO LQCR: 5.0000 mL | Freq: Two times a day (BID) | ORAL | Status: DC | PRN

## 2014-03-04 MED ORDER — PREDNISONE 10 MG PO TABS: ORAL_TABLET | ORAL | Status: DC

## 2014-03-04 NOTE — Progress Notes (Signed)
Pre visit review using our clinic review tool, if applicable. No additional management support is needed unless otherwise documented below in the visit note. 

## 2014-03-04 NOTE — Progress Notes (Signed)
   Subjective:    Patient ID: Terri Fletcher, female    DOB: 1960/06/22, 53 y.o.   MRN: [id number]  HPI  Here with acute onset mild to mod 2-3 days ST, HA, general weakness and malaise, with prod cough greenish sputum, but Pt denies chest pain, increased sob or doe, wheezing, orthopnea, PND, increased LE swelling, palpitations, dizziness or syncope, except for mild wheeze/sob last PM Past Medical History  Diagnosis Date  . ALLERGIC RHINITIS   . ANXIETY, SITUATIONAL   . ASTHMA   . COMMON MIGRAINE   . DIARRHEA, RECURRENT   . INSOMNIA, PERSISTENT   . Impaired glucose tolerance 09/25/2012   Past Surgical History  Procedure Laterality Date  . Cesarean section  06/25/1982  . Cesarean section  11/25/1978  . Breast surgery  11/05/2012    excision left breast mass    reports that she has quit smoking. Her smoking use included Cigarettes. She smoked 0.00 packs per day. She has never used smokeless tobacco. She reports that she drinks alcohol. She reports that she does not use illicit drugs. family history includes Cancer in her maternal grandmother and mother; Heart attack in her maternal grandfather. Allergies  Allergen Reactions  . Haldol [Haloperidol Lactate] Other (See Comments)    Lock Jaw  . Cefuroxime Axetil     REACTION: nausea   Current Outpatient Prescriptions on File Prior to Visit  Medication Sig Dispense Refill  . albuterol (PROAIR HFA) 108 (90 BASE) MCG/ACT inhaler INHALE TWO PUFFS BY MOUTH FOUR TIMES DAILY FOR WHEEZING  8.5 each  11  . ALPRAZolam (XANAX) 0.5 MG tablet Take 1 tablet (0.5 mg total) by mouth 2 (two) times daily as needed.  180 tablet  0  . zolpidem (AMBIEN) 10 MG tablet Take 1 tablet (10 mg total) by mouth at bedtime as needed for sleep.  90 tablet  1   No current facility-administered medications on file prior to visit.   Review of Systems  Constitutional: Negative for unusual diaphoresis or other sweats  HENT: Negative for ringing in ear Eyes: Negative for  double vision or worsening visual disturbance.  Respiratory: Negative for choking and stridor.   Gastrointestinal: Negative for vomiting or other signifcant bowel change Genitourinary: Negative for hematuria or decreased urine volume.  Musculoskeletal: Negative for other MSK pain or swelling Skin: Negative for color change and worsening wound.  Neurological: Negative for tremors and numbness other than noted  Psychiatric/Behavioral: Negative for decreased concentration or agitation other than above       Objective:   Physical Exam BP 122/70  Pulse 75  Temp(Src) 98.2 F (36.8 C) (Oral)  Wt 215 lb 6 oz (97.693 kg)  SpO2 98% VS noted, mild ill Constitutional: Pt appears well-developed, well-nourished.  HENT: Head: NCAT.  Right Ear: External ear normal.  Left Ear: External ear normal.  Eyes: . Pupils are equal, round, and reactive to light. Conjunctivae and EOM are normal Bilat tm's with mild erythema.  Max sinus areas non tender.  Pharynx with mild erythema, no exudate Neck: Normal range of motion. Neck supple.  Cardiovascular: Normal rate and regular rhythm.   Pulmonary/Chest: Effort normal and breath sounds decreased bilat with few wheezes Neurological: Pt is alert. Not confused , motor grossly intact Skin: Skin is warm. No rash Psychiatric: Pt behavior is normal. No agitation.     Assessment & Plan:

## 2014-03-04 NOTE — Patient Instructions (Signed)
Please take all new medication as prescribed  Please continue all other medications as before, and refills have been done if requested.  Please have the pharmacy call with any other refills you may need.  Please keep your appointments with your specialists as you may have planned     

## 2014-03-09 DIAGNOSIS — J45901 Unspecified asthma with (acute) exacerbation: Secondary | ICD-10-CM | POA: Insufficient documentation

## 2014-03-09 DIAGNOSIS — J209 Acute bronchitis, unspecified: Secondary | ICD-10-CM | POA: Insufficient documentation

## 2014-03-09 NOTE — Assessment & Plan Note (Signed)
Mild to mod, for antibx course,  to f/u any worsening symptoms or concerns 

## 2014-03-09 NOTE — Assessment & Plan Note (Signed)
/  Mild to mod, for predpac asd,  to f/u any worsening symptoms or concerns 

## 2014-03-10 ENCOUNTER — Ambulatory Visit (INDEPENDENT_AMBULATORY_CARE_PROVIDER_SITE_OTHER): Payer: Managed Care, Other (non HMO) | Admitting: Internal Medicine

## 2014-03-10 ENCOUNTER — Encounter: Payer: Self-pay | Admitting: Internal Medicine

## 2014-03-10 VITALS — BP 124/72 | HR 83 | Temp 98.7°F | Wt 204.0 lb

## 2014-03-10 DIAGNOSIS — R5381 Other malaise: Secondary | ICD-10-CM

## 2014-03-10 DIAGNOSIS — R110 Nausea: Secondary | ICD-10-CM

## 2014-03-10 DIAGNOSIS — R5383 Other fatigue: Principal | ICD-10-CM

## 2014-03-10 DIAGNOSIS — R6883 Chills (without fever): Secondary | ICD-10-CM

## 2014-03-10 NOTE — Patient Instructions (Addendum)

## 2014-03-10 NOTE — Progress Notes (Signed)
Subjective:    Patient ID: Terri Fletcher, female    DOB: 10-08-1960, 53 y.o.   MRN: [id number]  HPI  Pt presents to the clinic today to followup OV from 03/04/14. Was diagnosed with acute bronchitis. Treated with azithromax and pred taper. She reports that she has finished her antibiotic. She continues to take the pred taper and cough medicine. She is feeling worse. She has no energy or appetite. She is nauseated. She has not started any new medications. She does not feel stressed or overwhelmed. She has not had sick contacts. She does have a history of allergies and asthma.  Review of Systems      Past Medical History  Diagnosis Date  . ALLERGIC RHINITIS   . ANXIETY, SITUATIONAL   . ASTHMA   . COMMON MIGRAINE   . DIARRHEA, RECURRENT   . INSOMNIA, PERSISTENT   . Impaired glucose tolerance 09/25/2012    Current Outpatient Prescriptions  Medication Sig Dispense Refill  . albuterol (PROAIR HFA) 108 (90 BASE) MCG/ACT inhaler INHALE TWO PUFFS BY MOUTH FOUR TIMES DAILY FOR WHEEZING  8.5 each  11  . ALPRAZolam (XANAX) 0.5 MG tablet Take 1 tablet (0.5 mg total) by mouth 2 (two) times daily as needed.  180 tablet  0  . chlorpheniramine-HYDROcodone (TUSSIONEX PENNKINETIC ER) 10-8 MG/5ML LQCR Take 5 mLs by mouth every 12 (twelve) hours as needed for cough.  115 mL  0  . predniSONE (DELTASONE) 10 MG tablet 3 tabs by mouth per day for 3 days,2tabs per day for 3 days,1tab per day for 3 days  18 tablet  0  . zolpidem (AMBIEN) 10 MG tablet Take 1 tablet (10 mg total) by mouth at bedtime as needed for sleep.  90 tablet  1   No current facility-administered medications for this visit.    Allergies  Allergen Reactions  . Haldol [Haloperidol Lactate] Other (See Comments)    Lock Jaw  . Cefuroxime Axetil     REACTION: nausea    Family History  Problem Relation Age of Onset  . Cancer Mother     Breast  . Cancer Maternal Grandmother   . Heart attack Maternal Grandfather     History   Social  History  . Marital Status: Married    Spouse Name: N/A    Number of Children: N/A  . Years of Education: N/A   Occupational History  . Not on file.   Social History Main Topics  . Smoking status: Former Smoker    Types: Cigarettes  . Smokeless tobacco: Never Used  . Alcohol Use: Yes     Comment: Occasional.  . Drug Use: No     Comment: Occassional.  . Sexual Activity: Not on file   Other Topics Concern  . Not on file   Social History Narrative   Regular exercise     Constitutional: Pt reports fatigue. Denies fever, malaise, headache or abrupt weight changes.  HEENT: Denies eye pain, eye redness, ear pain, ringing in the ears, wax buildup, runny nose, nasal congestion, bloody nose, or sore throat. Respiratory: Denies difficulty breathing, shortness of breath, cough or sputum production.   Cardiovascular: Denies chest pain, chest tightness, palpitations or swelling in the hands or feet.  Gastrointestinal: Pt reports nausea. Denies abdominal pain, bloating, constipation, diarrhea or blood in the stool.  GU: Denies urgency, frequency, pain with urination, burning sensation, blood in urine, odor or discharge.   No other specific complaints in a complete review of systems (except as  listed in HPI above).  Objective:   Physical Exam   BP 124/72  Pulse 83  Temp(Src) 98.7 F (37.1 C) (Oral)  Wt 204 lb (92.534 kg)  SpO2 99% Wt Readings from Last 3 Encounters:  03/10/14 204 lb (92.534 kg)  03/04/14 215 lb 6 oz (97.693 kg)  10/15/13 209 lb 12 oz (95.142 kg)    General: Appears her stated age, well developed, well nourished in NAD. Skin: Clammy. HEENT: Ears: Tm's gray and intact, normal light reflex; Nose: mucosa pink and moist, septum midline; Throat/Mouth: Teeth present, mucosa pink and moist, no exudate, lesions or ulcerations noted.  Cardiovascular: Tachycardic with normal rhythm. S1,S2 noted.  No murmur, rubs or gallops noted.  Pulmonary/Chest: Normal effort and  positive vesicular breath sounds. No respiratory distress. No wheezes, rales or ronchi noted.  Abdomen: Soft and nontender. Normal bowel sounds, no bruits noted. No distention or masses noted. Liver, spleen and kidneys non palpable.   BMET    Component Value Date/Time   NA 139 09/25/2012 1211   K 4.6 09/25/2012 1211   CL 104 09/25/2012 1211   CO2 26 09/25/2012 1211   GLUCOSE 116* 09/25/2012 1211   BUN 8 09/25/2012 1211   CREATININE 0.7 09/25/2012 1211   CALCIUM 10.0 09/25/2012 1211    Lipid Panel  No results found for this basename: chol, trig, hdl, cholhdl, vldl, ldlcalc    CBC    Component Value Date/Time   WBC 9.3 09/25/2012 1211   RBC 4.79 09/25/2012 1211   HGB 14.8 09/25/2012 1211   HCT 43.6 09/25/2012 1211   PLT 252.0 09/25/2012 1211   MCV 91.2 09/25/2012 1211   MCHC 33.9 09/25/2012 1211   RDW 13.2 09/25/2012 1211   LYMPHSABS 2.0 09/25/2012 1211   MONOABS 0.5 09/25/2012 1211   EOSABS 0.0 09/25/2012 1211   BASOSABS 0.0 09/25/2012 1211    Hgb A1C Lab Results  Component Value Date   HGBA1C 5.9 09/26/2012        Assessment & Plan:   Fatigue, chills, nausea:  Exam normal No need for chest xray Will check CBC, CMET, TSH and B12 She declines RX for nausea  Will follow up after labs. Try to rest, drink plenty of fluid over the next 24 hours.

## 2014-03-10 NOTE — Progress Notes (Signed)
Pre visit review using our clinic review tool, if applicable. No additional management support is needed unless otherwise documented below in the visit note. 

## 2014-03-11 LAB — VITAMIN B12: Vitamin B-12: 334 pg/mL (ref 211–911)

## 2014-03-11 LAB — TSH: TSH: 0.65 u[IU]/mL (ref 0.35–4.50)

## 2014-03-11 LAB — COMPREHENSIVE METABOLIC PANEL
ALT: 28 U/L (ref 0–35)
AST: 23 U/L (ref 0–37)
Albumin: 5 g/dL (ref 3.5–5.2)
Alkaline Phosphatase: 87 U/L (ref 39–117)
BUN: 14 mg/dL (ref 6–23)
CALCIUM: 10.1 mg/dL (ref 8.4–10.5)
CHLORIDE: 104 meq/L (ref 96–112)
CO2: 22 meq/L (ref 19–32)
CREATININE: 0.9 mg/dL (ref 0.4–1.2)
GFR: 70.13 mL/min (ref >60.00)
Glucose, Bld: 126 mg/dL — ABNORMAL HIGH (ref 70–99)
Potassium: 4 mEq/L (ref 3.5–5.1)
SODIUM: 137 meq/L (ref 135–145)
TOTAL PROTEIN: 8.5 g/dL — AB (ref 6.0–8.3)
Total Bilirubin: 0.6 mg/dL (ref 0.2–1.2)

## 2014-03-11 LAB — CBC
HCT: 47.9 % — ABNORMAL HIGH (ref 36.0–46.0)
HEMOGLOBIN: 15.9 g/dL — AB (ref 12.0–15.0)
MCHC: 33.3 g/dL (ref 30.0–36.0)
MCV: 92 fl (ref 78.0–100.0)
Platelets: 259 10*3/uL (ref 150.0–400.0)
RBC: 5.21 Mil/uL — ABNORMAL HIGH (ref 3.87–5.11)
RDW: 13.7 % (ref 11.5–15.5)
WBC: 9.4 10*3/uL (ref 4.0–10.5)

## 2014-04-02 ENCOUNTER — Telehealth: Payer: Self-pay | Admitting: Internal Medicine

## 2014-04-02 NOTE — Telephone Encounter (Signed)
Per [employer], pt returned to pick up form as she will need to contact PCP to have forms filled out

## 2014-04-02 NOTE — Telephone Encounter (Signed)
Put on desk

## 2014-04-02 NOTE — Telephone Encounter (Signed)
Pt needs Open Enrollment form filled out and completed for insurance. Pt stated that she has to turn it in by tomorrow morning so she needs it back by today.

## 2014-04-03 ENCOUNTER — Encounter: Payer: Self-pay | Admitting: Internal Medicine

## 2014-04-03 NOTE — Telephone Encounter (Signed)
Have we seen this form?

## 2014-04-04 ENCOUNTER — Other Ambulatory Visit: Payer: Self-pay

## 2014-06-17 ENCOUNTER — Encounter: Payer: Self-pay | Admitting: Internal Medicine

## 2014-06-17 MED ORDER — ZOLPIDEM TARTRATE 10 MG PO TABS: 10.0000 mg | ORAL_TABLET | Freq: Every evening | ORAL | Status: DC | PRN

## 2014-06-17 NOTE — Telephone Encounter (Signed)
Done hardcopy to robin  

## 2014-08-01 ENCOUNTER — Telehealth: Payer: Self-pay

## 2014-08-01 NOTE — Telephone Encounter (Signed)
LVM for pt to call back.   RE: Flu vaccine for 2015/2016 

## 2014-08-04 IMAGING — US US RENAL
1 series · 14 of 25 positions shown · non-contrast
Comparison: None

CLINICAL DATA: Left flank pain.  Rule out hydronephrosis

RENAL/URINARY TRACT ULTRASOUND COMPLETE

[Series 1: us renal · 0.34mm/px · 14 of 42 slices shown]
[im 1/42]
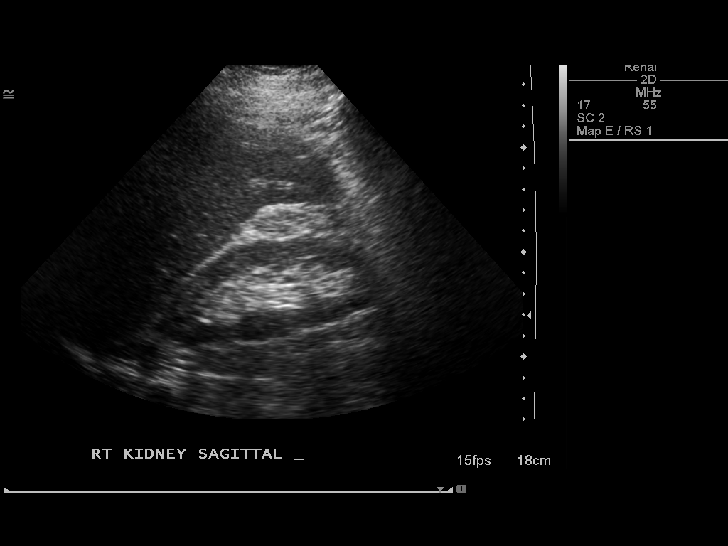
[im 4/42]
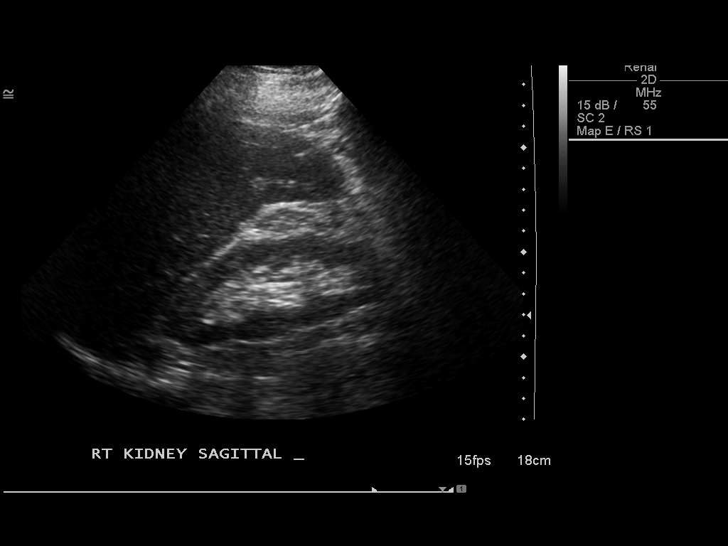
[im 7/42]
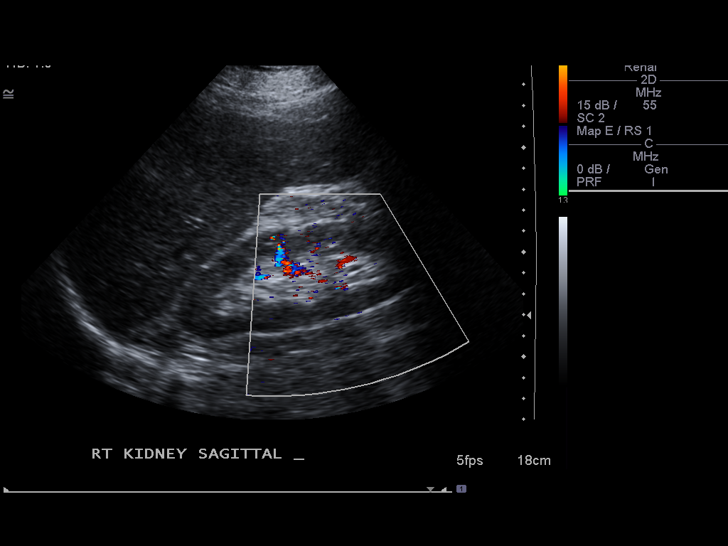
[im 11/42]
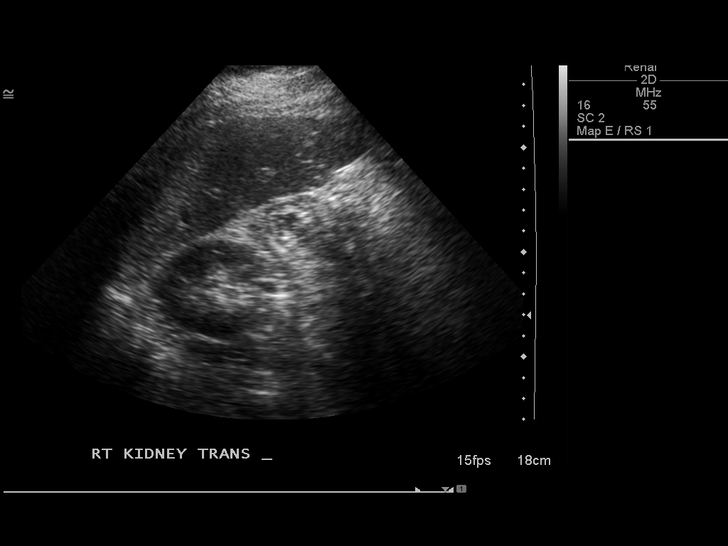
[im 14/42]
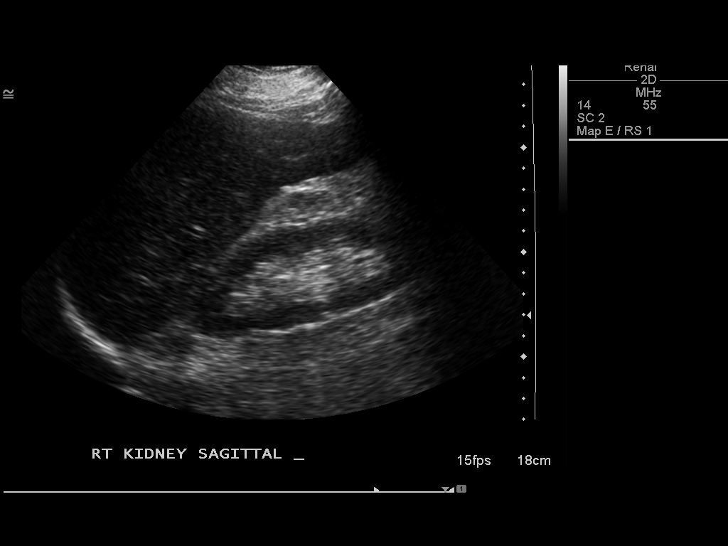
[im 16/42]
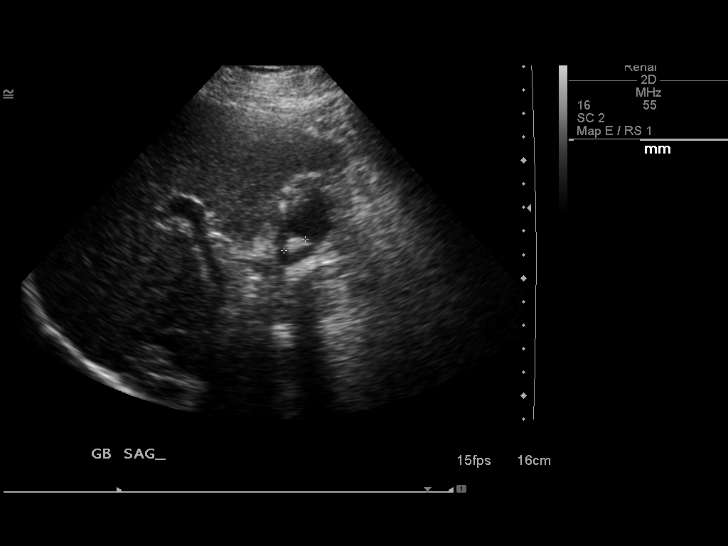
[im 19/42]
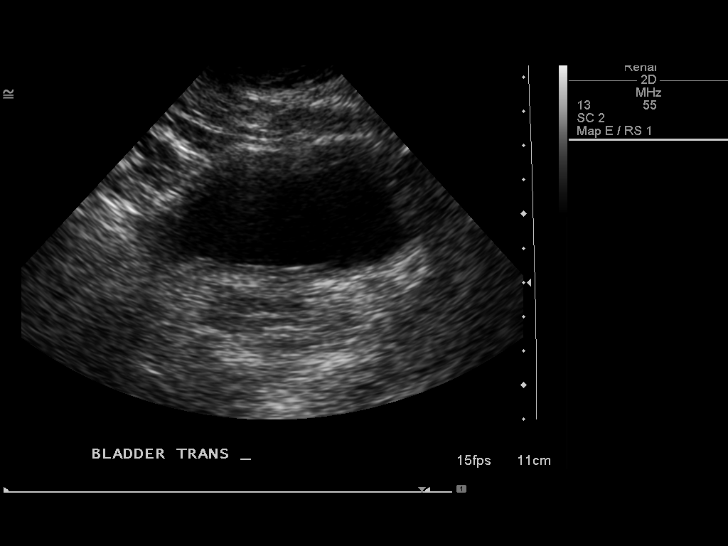
[im 23/42]
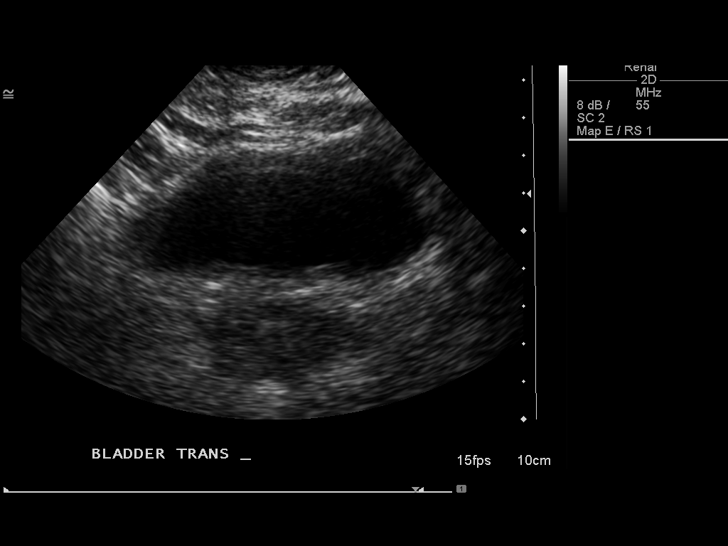
[im 26/42]
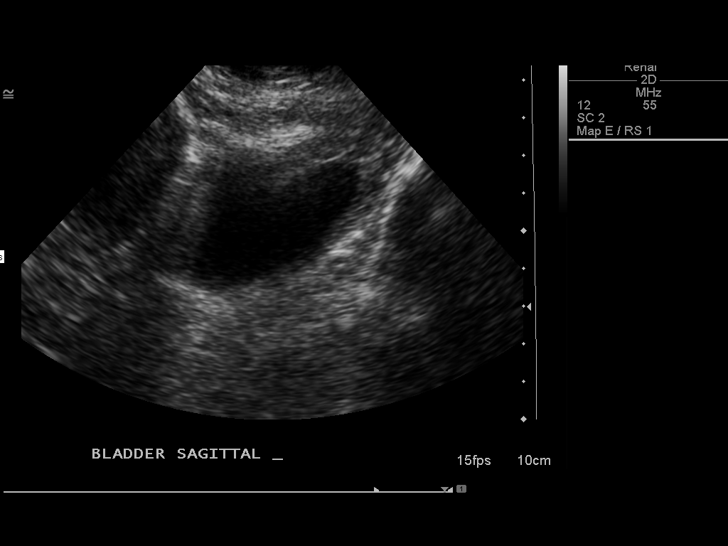
[im 28/42]
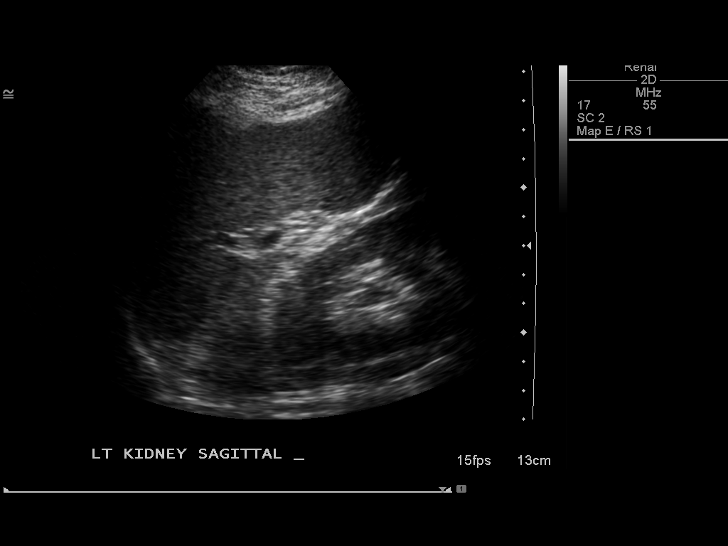
[im 31/42]
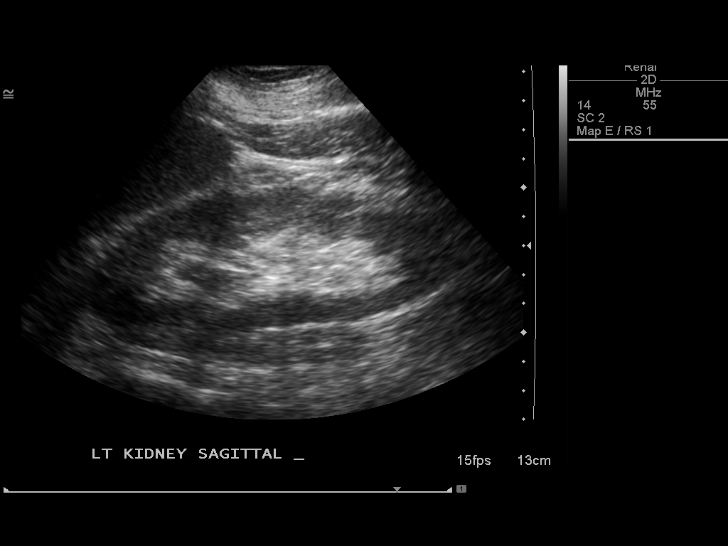
[im 35/42]
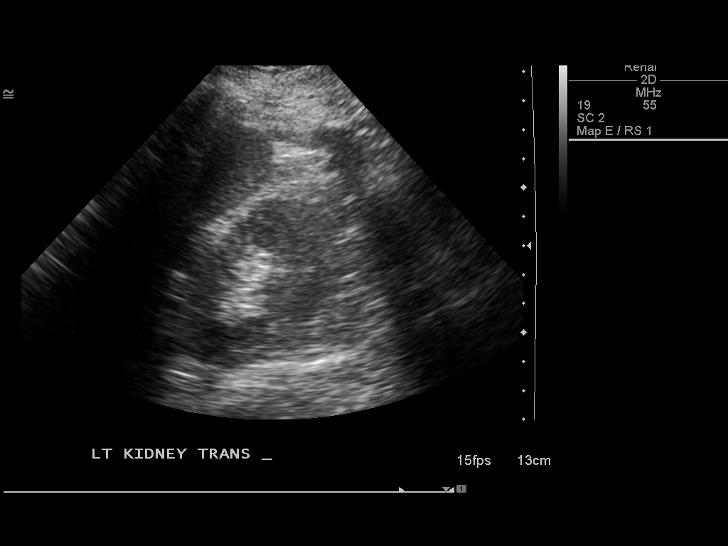
[im 38/42]
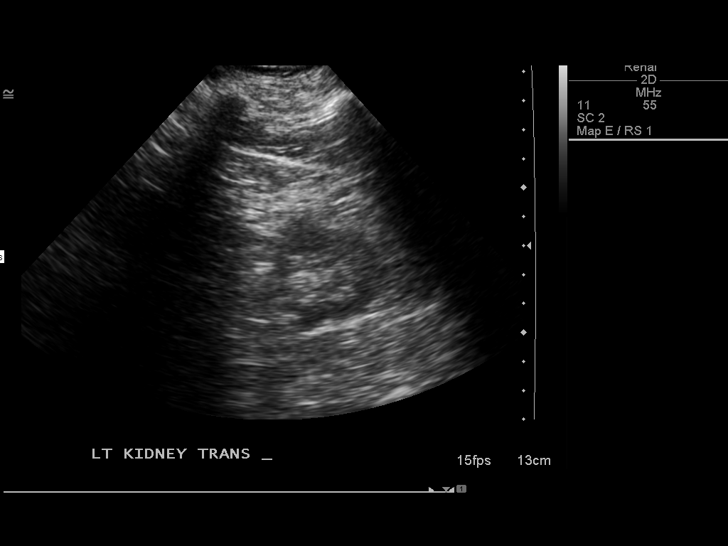
[im 42/42]
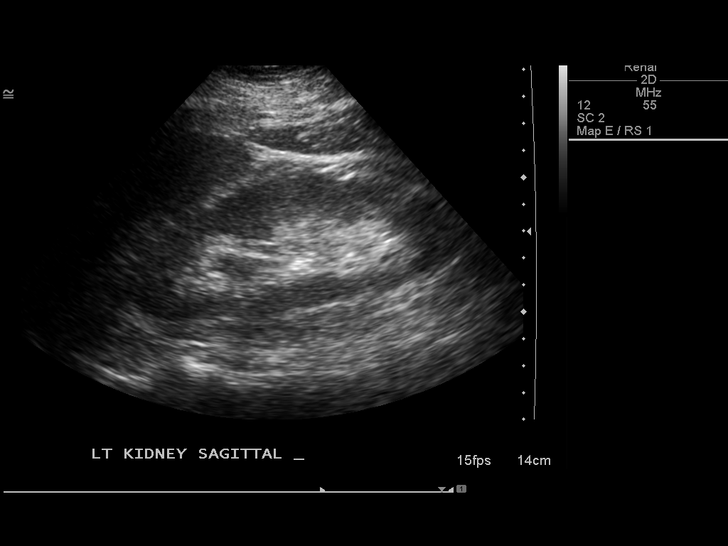

[14 of 25 positions shown; findings below may reference images not displayed]

FINDINGS: Right Kidney:  12.6 cm.  Negative for obstruction or mass.  Normal
renal cortex.

Left Kidney:  12.7 cm.  Negative for obstruction or mass.  Normal
renal cortex.

Bladder:  Negative

Gallstones are noted.
IMPRESSION: Negative renal ultrasound.

Gallstones

## 2014-08-31 IMAGING — CR DG CHEST 2V
2 series · 2 of 2 positions shown · non-contrast
Comparison: 02/10/2009.

CLINICAL DATA: Cough and shortness of breath.

CHEST - 2 VIEW

[view not recorded (1 of 2)]
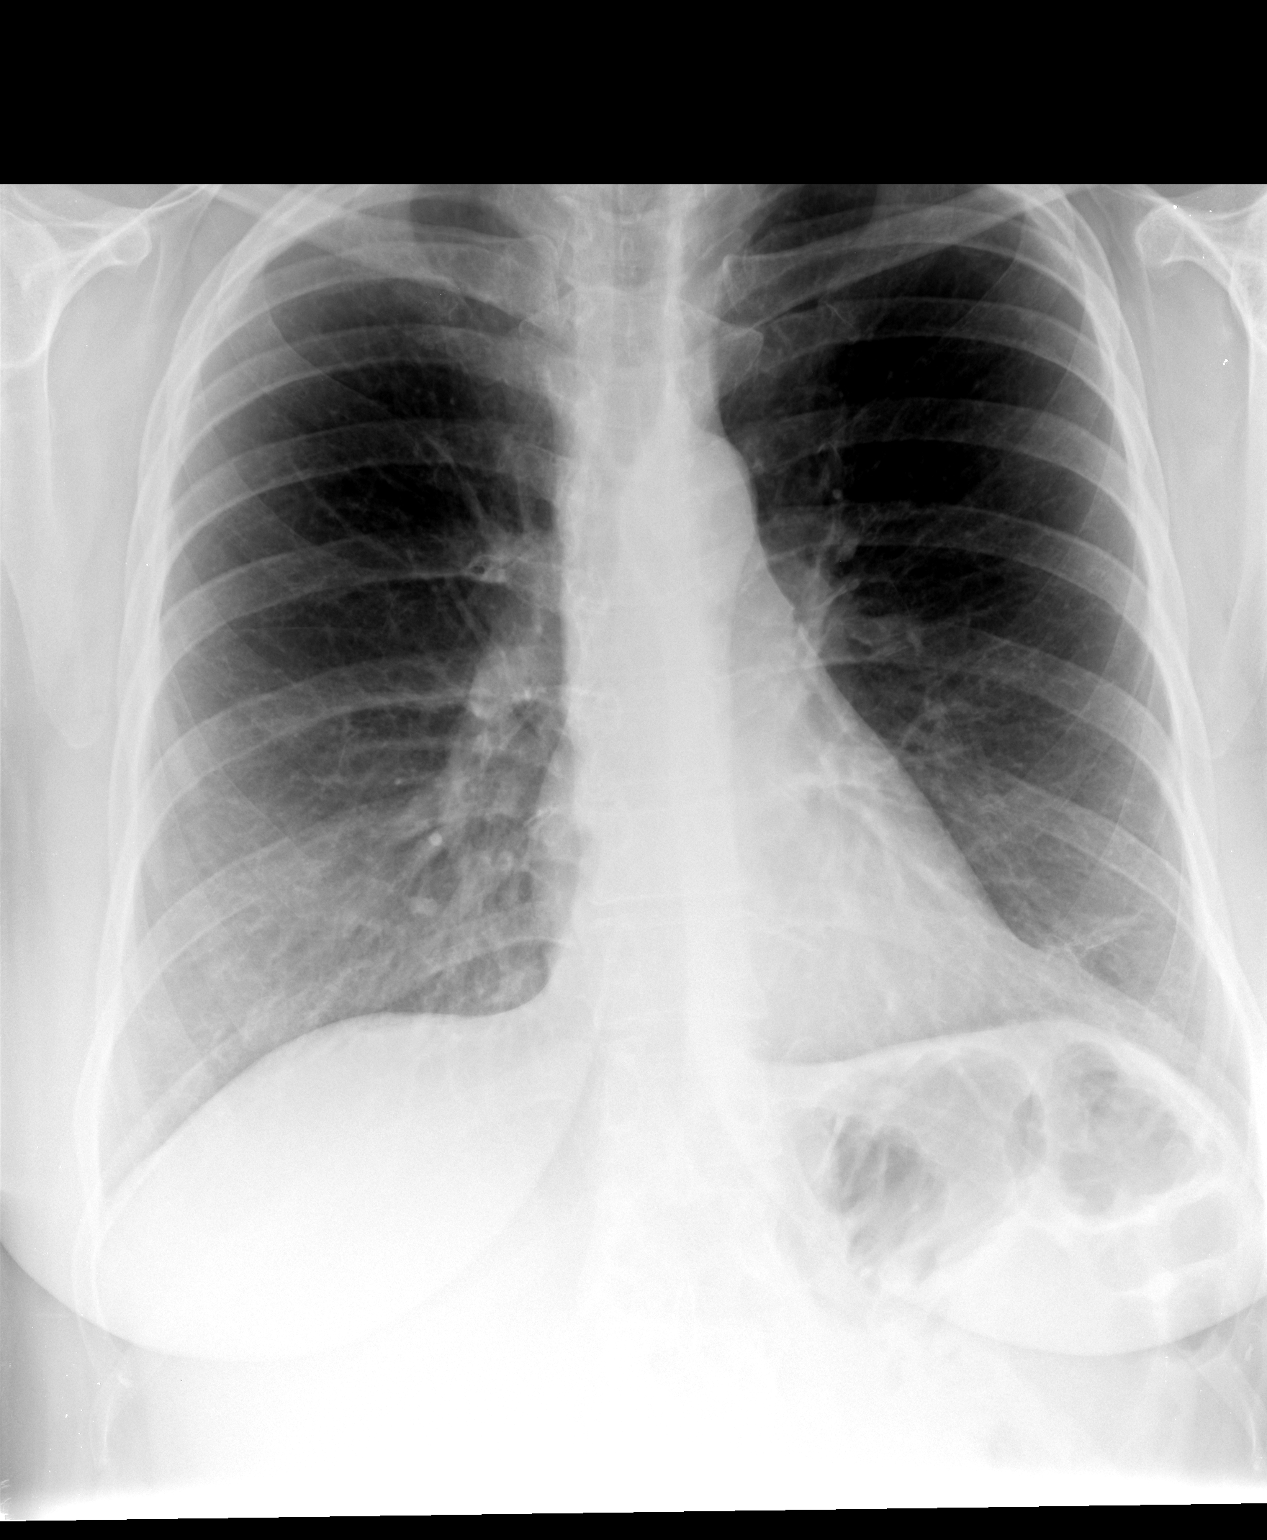

[view not recorded (2 of 2)]
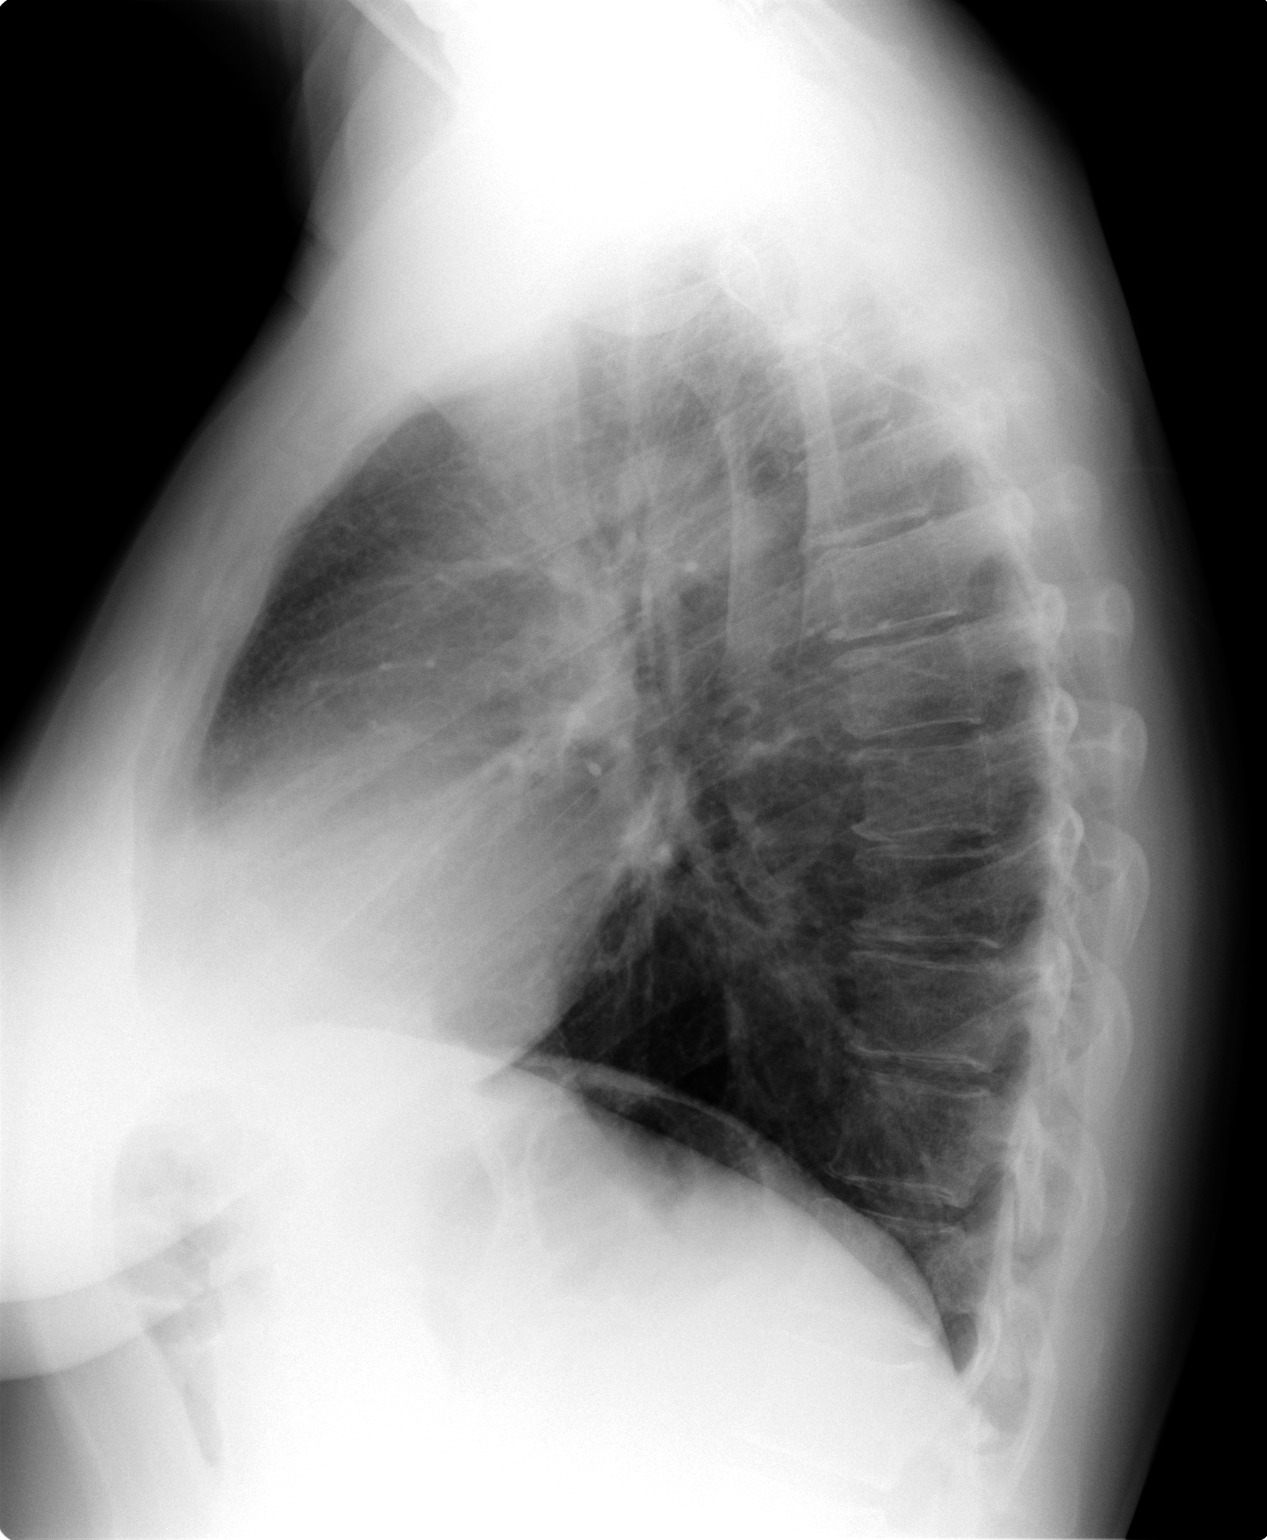

[2 of 2 positions shown; findings below may reference images not displayed]

FINDINGS: Trachea is midline.  Heart size normal.  Linear scarring
in the lingula.  Biapical pleural thickening.  Lungs are otherwise
clear.  No pleural fluid.
IMPRESSION: No acute findings.

## 2014-10-23 ENCOUNTER — Ambulatory Visit (INDEPENDENT_AMBULATORY_CARE_PROVIDER_SITE_OTHER): Payer: Managed Care, Other (non HMO) | Admitting: Internal Medicine

## 2014-10-23 ENCOUNTER — Encounter: Payer: Self-pay | Admitting: Internal Medicine

## 2014-10-23 VITALS — BP 118/78 | HR 70 | Temp 98.5°F | Resp 18 | Ht 71.5 in | Wt 218.1 lb

## 2014-10-23 DIAGNOSIS — Z0189 Encounter for other specified special examinations: Secondary | ICD-10-CM | POA: Diagnosis not present

## 2014-10-23 DIAGNOSIS — J4520 Mild intermittent asthma, uncomplicated: Secondary | ICD-10-CM

## 2014-10-23 DIAGNOSIS — F411 Generalized anxiety disorder: Secondary | ICD-10-CM | POA: Diagnosis not present

## 2014-10-23 DIAGNOSIS — Z0000 Encounter for general adult medical examination without abnormal findings: Secondary | ICD-10-CM

## 2014-10-23 DIAGNOSIS — J309 Allergic rhinitis, unspecified: Secondary | ICD-10-CM

## 2014-10-23 DIAGNOSIS — J019 Acute sinusitis, unspecified: Secondary | ICD-10-CM | POA: Diagnosis not present

## 2014-10-23 MED ORDER — ALBUTEROL SULFATE HFA 108 (90 BASE) MCG/ACT IN AERS: INHALATION_SPRAY | RESPIRATORY_TRACT | Status: DC

## 2014-10-23 MED ORDER — LEVOFLOXACIN 500 MG PO TABS: 500.0000 mg | ORAL_TABLET | Freq: Every day | ORAL | Status: DC

## 2014-10-23 MED ORDER — PREDNISONE 10 MG PO TABS: ORAL_TABLET | ORAL | Status: DC

## 2014-10-23 MED ORDER — ALPRAZOLAM 0.5 MG PO TABS: 0.5000 mg | ORAL_TABLET | Freq: Two times a day (BID) | ORAL | Status: DC | PRN

## 2014-10-23 MED ORDER — FEXOFENADINE HCL 180 MG PO TABS
180.0000 mg | ORAL_TABLET | Freq: Every day | ORAL | Status: DC
Start: 2014-10-23 — End: 2015-04-01

## 2014-10-23 MED ORDER — TRIAMCINOLONE ACETONIDE 55 MCG/ACT NA AERO: 2.0000 | INHALATION_SPRAY | Freq: Every day | NASAL | Status: DC

## 2014-10-23 NOTE — Progress Notes (Signed)
   Subjective:    Patient ID: Terri MaduroSusan Nakanishi, female    DOB: 10/13/1960, 54 y.o.   MRN: [id number]  HPI   Here with 2-3 days acute onset fever, facial pain, pressure, headache, general weakness and malaise, and greenish d/c, with mild ST and cough, but pt denies chest pain, wheezing, increased sob or doe, orthopnea, PND, increased LE swelling, palpitations, dizziness or syncope.  Does have several wks ongoing nasal allergy symptoms with clearish congestion, itch and sneezing, without fever, pain, ST, cough, swelling or wheezing. Denies worsening depressive symptoms, suicidal ideation, or panic; has ongoing anxiety, not increased recently. For xanax refill   Past Medical History  Diagnosis Date  . ALLERGIC RHINITIS   . ANXIETY, SITUATIONAL   . ASTHMA   . COMMON MIGRAINE   . DIARRHEA, RECURRENT   . INSOMNIA, PERSISTENT   . Impaired glucose tolerance 09/25/2012   Past Surgical History  Procedure Laterality Date  . Cesarean section  06/25/1982  . Cesarean section  11/25/1978  . Breast surgery  11/05/2012    excision left breast mass    reports that she has quit smoking. Her smoking use included Cigarettes. She has never used smokeless tobacco. She reports that she drinks alcohol. She reports that she does not use illicit drugs. family history includes Cancer in her maternal grandmother and mother; Heart attack in her maternal grandfather. Allergies  Allergen Reactions  . Haldol [Haloperidol Lactate] Other (See Comments)    Lock Jaw  . Cefuroxime Axetil     REACTION: nausea   Current Outpatient Prescriptions on File Prior to Visit  Medication Sig Dispense Refill  . zolpidem (AMBIEN) 10 MG tablet Take 1 tablet (10 mg total) by mouth at bedtime as needed for sleep. 90 tablet 1   No current facility-administered medications on file prior to visit.   Review of Systems  Constitutional: Negative for unusual diaphoresis or night sweats HENT: Negative for ringing in ear or discharge Eyes:  Negative for double vision or worsening visual disturbance.  Respiratory: Negative for choking and stridor.   Gastrointestinal: Negative for vomiting or other signifcant bowel change Genitourinary: Negative for hematuria or change in urine volume.  Musculoskeletal: Negative for other MSK pain or swelling Skin: Negative for color change and worsening wound.  Neurological: Negative for tremors and numbness other than noted  Psychiatric/Behavioral: Negative for decreased concentration or agitation other than above       Objective:   Physical Exam BP 118/78 mmHg  Pulse 70  Temp(Src) 98.5 F (36.9 C) (Oral)  Resp 18  Ht 5' 11.5" (1.816 m)  Wt 218 lb 1.9 oz (98.939 kg)  BMI 30.00 kg/m2  SpO2 97% VS noted,  Constitutional: Pt appears in no significant distress HENT: Head: NCAT.  Right Ear: External ear normal.  Left Ear: External ear normal.  Bilat tm's with mild erythema.  Max sinus areas mild tender.  Pharynx with mild erythema, no exudate Eyes: . Pupils are equal, round, and reactive to light. Conjunctivae and EOM are normal Neck: Normal range of motion. Neck supple.  Cardiovascular: Normal rate and regular rhythm.   Pulmonary/Chest: Effort normal and breath sounds without rales or wheezing.  Abd:  Soft, NT, ND, + BS Neurological: Pt is alert. Not confused , motor grossly intact Skin: Skin is warm. No rash, no LE edema Psychiatric: Pt behavior is normal. No agitation. mild nervous    Assessment & Plan:

## 2014-10-23 NOTE — Assessment & Plan Note (Signed)
Mild to mod seasonal flare it seems, for predpac course, allegra/nasacort asd,,  to f/u any worsening symptoms or concerns

## 2014-10-23 NOTE — Assessment & Plan Note (Signed)
stable overall by history and exam, recent data reviewed with pt, and pt to continue medical treatment as before,  to f/u any worsening symptoms or concerns Lab Results  Component Value Date   WBC 9.4 03/10/2014   HGB 15.9* 03/10/2014   HCT 47.9* 03/10/2014   PLT 259.0 03/10/2014   GLUCOSE 126* 03/10/2014   ALT 28 03/10/2014   AST 23 03/10/2014   NA 137 03/10/2014   K 4.0 03/10/2014   CL 104 03/10/2014   CREATININE 0.9 03/10/2014   BUN 14 03/10/2014   CO2 22 03/10/2014   TSH 0.65 03/10/2014   HGBA1C 5.9 09/26/2012   For xanax refill

## 2014-10-23 NOTE — Assessment & Plan Note (Signed)
.  stable overall by history and exam, recent data reviewed with pt, and pt to continue medical treatment as before,  to f/u any worsening symptoms or concerns SpO2 Readings from Last 3 Encounters:  10/23/14 97%  03/10/14 99%  03/04/14 98%

## 2014-10-23 NOTE — Assessment & Plan Note (Signed)
Mild to mod, for antibx course,  to f/u any worsening symptoms or concerns 

## 2014-10-23 NOTE — Patient Instructions (Addendum)
Please take all new medication as prescribed - the antibiotic, as well as prednisone, allegra and nasacort aq for the allergies  Please continue all other medications as before, and refills have been done if requested.  Please have the pharmacy call with any other refills you may need.  Please keep your appointments with your specialists as you may have planned  Please return in 3 months, or sooner if needed, with Lab testing done 3-5 days before

## 2014-12-03 ENCOUNTER — Encounter: Payer: Self-pay | Admitting: Internal Medicine

## 2014-12-04 MED ORDER — ZOLPIDEM TARTRATE 10 MG PO TABS: 10.0000 mg | ORAL_TABLET | Freq: Every evening | ORAL | Status: DC | PRN

## 2014-12-04 NOTE — Telephone Encounter (Signed)
Done hardcopy to Dahlia  

## 2015-01-01 ENCOUNTER — Encounter: Payer: Self-pay | Admitting: Internal Medicine

## 2015-01-01 MED ORDER — ZOLPIDEM TARTRATE 10 MG PO TABS: 10.0000 mg | ORAL_TABLET | Freq: Every evening | ORAL | Status: DC | PRN

## 2015-01-01 NOTE — Telephone Encounter (Signed)
Pt requesting refill of Ambien. Rx dated 12/03/2014 was sent to local instead of mail order pharmacy. Rx was cancelled at local pharmacy, please advise

## 2015-01-01 NOTE — Telephone Encounter (Signed)
Rx faxed to [facility].

## 2015-01-02 ENCOUNTER — Telehealth: Payer: Self-pay | Admitting: Internal Medicine

## 2015-01-02 NOTE — Telephone Encounter (Signed)
Called patient to see if a recent mammogram has been done, patient states that she will call to set up appointment.

## 2015-04-01 ENCOUNTER — Ambulatory Visit (INDEPENDENT_AMBULATORY_CARE_PROVIDER_SITE_OTHER): Payer: Managed Care, Other (non HMO) | Admitting: Family Medicine

## 2015-04-01 ENCOUNTER — Encounter: Payer: Self-pay | Admitting: Family Medicine

## 2015-04-01 VITALS — BP 128/78 | HR 82 | Temp 98.4°F | Wt 209.5 lb

## 2015-04-01 DIAGNOSIS — N10 Acute tubulo-interstitial nephritis: Secondary | ICD-10-CM

## 2015-04-01 DIAGNOSIS — R350 Frequency of micturition: Secondary | ICD-10-CM | POA: Diagnosis not present

## 2015-04-01 DIAGNOSIS — N390 Urinary tract infection, site not specified: Secondary | ICD-10-CM | POA: Insufficient documentation

## 2015-04-01 LAB — POCT URINALYSIS DIPSTICK
Bilirubin, UA: NEGATIVE
Glucose, UA: NEGATIVE
Ketones, UA: NEGATIVE
Leukocytes, UA: NEGATIVE
NITRITE UA: NEGATIVE
SPEC GRAV UA: 1.01
UROBILINOGEN UA: 0.2
pH, UA: 6

## 2015-04-01 MED ORDER — CIPROFLOXACIN HCL 500 MG PO TABS: 500.0000 mg | ORAL_TABLET | Freq: Two times a day (BID) | ORAL | Status: DC

## 2015-04-01 NOTE — Progress Notes (Signed)
Pre visit review using our clinic review tool, if applicable. No additional management support is needed unless otherwise documented below in the visit note.  She has a cold about 2-3 weeks ago. That resolved except for minimal cough that hasn't fully resolved (but is improving slowly). In the last 2-3 days, with chills.   Now with urinary frequency and urgency.  Limited UOP per void No burning with urination.  duration of symptoms:~2 days.   abdominal pain: no Dec in appetite.   Fevers: tmax 100.6 this AM.   back pain: at the B shoulder, more on the L lower back.   Vomiting: no  H/o similar sx prev, but not in years.  H/o kidney infection, not just cystitis.    Meds, vitals, and allergies reviewed.   ROS: See HPI.  Otherwise negative.    GEN: nad, alert and oriented HEENT: mucous membranes moist NECK: supple CV: rrr.  PULM: ctab, no inc wob ABD: soft, +bs, suprapubic area not tender, abd not ttp EXT: no edema SKIN: no acute rash BACK: L lower back ttp

## 2015-04-01 NOTE — Assessment & Plan Note (Signed)
Presumed dx, still nontoxic.  Okay for outpatient f/u.  Continue liberal fluids.  Start cipro. Check ucx.  Unlikely this is from a stone by itself, given the fever.   If worsening, then needs to seek re-eval and/or update us.  She agrees with plan.   Routed to PCP as FYI.

## 2015-04-01 NOTE — Patient Instructions (Signed)
Drink plenty of water and start the antibiotics today.  We'll contact you with your lab report.  Take care.   

## 2015-04-03 ENCOUNTER — Telehealth: Payer: Self-pay

## 2015-04-03 ENCOUNTER — Other Ambulatory Visit: Payer: Self-pay | Admitting: Internal Medicine

## 2015-04-03 LAB — URINE CULTURE: Colony Count: 50000

## 2015-04-03 MED ORDER — ONDANSETRON HCL 4 MG PO TABS: 4.0000 mg | ORAL_TABLET | Freq: Three times a day (TID) | ORAL | Status: DC | PRN

## 2015-04-03 NOTE — Telephone Encounter (Signed)
Call pt. zofran sent for nausea.  Would try to finish the cipro.  KLEBSIELLA PNEUMONIAE on ucx, sens to cipro.  It should cover the infection.  If not improved or if nausea is intolerable, then update us.   Thanks.

## 2015-04-03 NOTE — Telephone Encounter (Signed)
Pt left v/m; pt was seen 04/01/15; pt said Dr Para Marchuncan offered anti nausea med but pt did not think she needed med. Pt request anti nausea med to [facility]; Cipro upsetting pt's stomach.Please advise.

## 2015-04-03 NOTE — Telephone Encounter (Signed)
Spoke to pt and advised per Dr Duncan 

## 2015-04-06 ENCOUNTER — Encounter: Payer: Self-pay | Admitting: Internal Medicine

## 2015-04-06 ENCOUNTER — Other Ambulatory Visit (INDEPENDENT_AMBULATORY_CARE_PROVIDER_SITE_OTHER): Payer: Managed Care, Other (non HMO)

## 2015-04-06 ENCOUNTER — Ambulatory Visit (INDEPENDENT_AMBULATORY_CARE_PROVIDER_SITE_OTHER)
Admission: RE | Admit: 2015-04-06 | Discharge: 2015-04-06 | Disposition: A | Discharge status: Home / Self Care | Payer: Managed Care, Other (non HMO) | Source: Ambulatory Visit | Attending: Internal Medicine | Admitting: Internal Medicine

## 2015-04-06 ENCOUNTER — Ambulatory Visit (INDEPENDENT_AMBULATORY_CARE_PROVIDER_SITE_OTHER): Payer: Managed Care, Other (non HMO) | Admitting: Internal Medicine

## 2015-04-06 VITALS — BP 128/60 | HR 86 | Temp 98.6°F | Ht 71.5 in | Wt 210.2 lb

## 2015-04-06 DIAGNOSIS — R509 Fever, unspecified: Secondary | ICD-10-CM | POA: Diagnosis not present

## 2015-04-06 DIAGNOSIS — R1012 Left upper quadrant pain: Secondary | ICD-10-CM | POA: Diagnosis not present

## 2015-04-06 DIAGNOSIS — R0609 Other forms of dyspnea: Secondary | ICD-10-CM

## 2015-04-06 DIAGNOSIS — R10816 Epigastric abdominal tenderness: Secondary | ICD-10-CM

## 2015-04-06 DIAGNOSIS — R109 Unspecified abdominal pain: Secondary | ICD-10-CM | POA: Diagnosis not present

## 2015-04-06 LAB — HEPATIC FUNCTION PANEL
ALBUMIN: 3.7 g/dL (ref 3.5–5.2)
ALT: 48 U/L — ABNORMAL HIGH (ref 0–35)
AST: 38 U/L — AB (ref 0–37)
Alkaline Phosphatase: 124 U/L — ABNORMAL HIGH (ref 39–117)
BILIRUBIN TOTAL: 0.5 mg/dL (ref 0.2–1.2)
Bilirubin, Direct: 0.1 mg/dL (ref 0.0–0.3)
Total Protein: 6.7 g/dL (ref 6.0–8.3)

## 2015-04-06 LAB — CBC WITH DIFFERENTIAL/PLATELET
Basophils Absolute: 0 10*3/uL (ref 0.0–0.1)
Basophils Relative: 0.5 % (ref 0.0–3.0)
EOS PCT: 0.6 % (ref 0.0–5.0)
Eosinophils Absolute: 0 10*3/uL (ref 0.0–0.7)
HCT: 39.2 % (ref 36.0–46.0)
Hemoglobin: 13.2 g/dL (ref 12.0–15.0)
LYMPHS ABS: 2.9 10*3/uL (ref 0.7–4.0)
Lymphocytes Relative: 46.3 % — ABNORMAL HIGH (ref 12.0–46.0)
MCHC: 33.8 g/dL (ref 30.0–36.0)
MCV: 89.1 fl (ref 78.0–100.0)
MONO ABS: 0.5 10*3/uL (ref 0.1–1.0)
MONOS PCT: 8.3 % (ref 3.0–12.0)
NEUTROS ABS: 2.8 10*3/uL (ref 1.4–7.7)
NEUTROS PCT: 44.3 % (ref 43.0–77.0)
PLATELETS: 184 10*3/uL (ref 150.0–400.0)
RBC: 4.39 Mil/uL (ref 3.87–5.11)
RDW: 13.6 % (ref 11.5–15.5)
WBC: 6.3 10*3/uL (ref 4.0–10.5)

## 2015-04-06 LAB — BASIC METABOLIC PANEL
BUN: 9 mg/dL (ref 6–23)
CHLORIDE: 103 meq/L (ref 96–112)
CO2: 27 meq/L (ref 19–32)
CREATININE: 0.91 mg/dL (ref 0.40–1.20)
Calcium: 9.1 mg/dL (ref 8.4–10.5)
GFR: 68.09 mL/min (ref >60.00)
GLUCOSE: 105 mg/dL — AB (ref 70–99)
POTASSIUM: 3.5 meq/L (ref 3.5–5.1)
Sodium: 138 mEq/L (ref 135–145)

## 2015-04-06 LAB — LIPASE: LIPASE: 36 U/L (ref 11.0–59.0)

## 2015-04-06 MED ORDER — METRONIDAZOLE 500 MG PO TABS: 500.0000 mg | ORAL_TABLET | Freq: Three times a day (TID) | ORAL | Status: DC

## 2015-04-06 NOTE — Progress Notes (Signed)
   Subjective:    Patient ID: Terri MaduroSusan Fletcher, female    DOB: 03/18/1961, 54 y.o.   MRN: [id number]  HPI   She is here with symptoms of chills, fever, fatigue, myalgias and exertional dyspnea.  She was seen 04/01/15 and placed on Cipro for left flank pain, urgency, and frequency. Because of nausea Zofran was also prescribed subsequently. The left flank pain is described as soreness up to level III.  As of 10/14 she has had temperatures up to 102.5. She felt better yesterday morning but last night had chills and during the night her temperature was up to 101.5.  The urine culture revealed 50,000 colonies of Klebsiella sensitive to Cipro.  She has history of asthma and has been using her albuterol on average twice a week.  She's never had a colonoscopy .She has been informed of the standard care.   Review of Systems  Frontal headache, facial pain , nasal purulence, dental pain, sore throat , otic pain or otic discharge denied.  Cough, sputum production, hemoptysis, or pleuritic pain denied.  No diarrhea present.Cola colored urine or clay colored stools denied.  Dysuria, pyuria, or hematuria not present.  No vaginal discharge or bleeding noted.  No new rashes, pustules, vesicles.  No redness or swelling of joints.  No significant travel, pets, or tick exposures.      Objective:   Physical Exam Pertinent or positive findings include: She appears uncomfortable but in no acute distress. Skin is damp to palpation. She has slight tenderness to percussion over the left flank. She has tenderness in the left upper quadrant and epigastric area. There is no meningismus to active or passive motion of the neck. Straight leg raising is negative.  General appearance :adequately nourished.  Eyes: No conjunctival inflammation or scleral icterus is present.  Oral exam:  Lips and gums are healthy appearing.There is no oropharyngeal erythema or exudate noted. Dental hygiene is good.  Heart:   Normal rate and regular rhythm. S1 and S2 normal without gallop, murmur, click, rub or other extra sounds    Lungs:Chest clear to auscultation; no wheezes, rhonchi,rales ,or rubs present.No increased work of breathing.   Abdomen: bowel sounds normal, soft without masses, organomegaly or hernias noted.  No guarding or rebound.  Vascular : all pulses equal ; no bruits present.  Skin:Warm & dry.  Intact without suspicious lesions or rashes ; no tenting or jaundice   Lymphatic: No lymphadenopathy is noted about the head, neck, axilla.   Neuro: Strength, tone & DTRs normal.     Assessment & Plan:  #1 flank and abdominal pain; rule out diverticulitis.  #2 fever  #3 urine culture positive for 50,000 colonies of Klebsiella sensitive to ciprofloxacin  Plan: See orders and recommendations

## 2015-04-06 NOTE — Patient Instructions (Signed)
Stay on clear liquids for 48-72 hours or until pain gone.This would include  jello, sherbert (NOT ice cream), Lipton's chicken noodle soup(NOT cream based soups),Gatorade Lite, flat Ginger ale (without High Fructose Corn Syrup),dry toast or crackers, baked potato.No milk , dairy or grease until better. Align , a [employer] Corporation[employer] Biotic , daily if stools are loose. Immodium AD for frankly watery stool. Report increasing pain, fever or rectal bleeding

## 2015-04-06 NOTE — Progress Notes (Signed)
Pre visit review using our clinic review tool, if applicable. No additional management support is needed unless otherwise documented below in the visit note. 

## 2015-04-07 MED ORDER — ZOLPIDEM TARTRATE 10 MG PO TABS: 10.0000 mg | ORAL_TABLET | Freq: Every evening | ORAL | Status: DC | PRN

## 2015-04-07 NOTE — Telephone Encounter (Signed)
Done hardcopy to Dahlia  

## 2015-04-08 ENCOUNTER — Other Ambulatory Visit: Payer: Self-pay | Admitting: Internal Medicine

## 2015-04-08 ENCOUNTER — Encounter: Payer: Self-pay | Admitting: Internal Medicine

## 2015-04-08 ENCOUNTER — Telehealth: Payer: Self-pay | Admitting: Emergency Medicine

## 2015-04-08 DIAGNOSIS — R7989 Other specified abnormal findings of blood chemistry: Secondary | ICD-10-CM

## 2015-04-08 DIAGNOSIS — R945 Abnormal results of liver function studies: Principal | ICD-10-CM

## 2015-04-08 NOTE — Telephone Encounter (Signed)
Pt called in unsure of why is is taking two antibiotics. She stated she can not take the Cipro, I instructed her to finish the antibiotics and to come in for fasting labs as advised by Dr Alwyn RenHopper. Pt refused to come in for labs and stated that she is just throwing away money and doesn't know what is wrong with her. I instructed her to come in Friday for her appt with Dr Jonny RuizJohn so that she could discuss the treatment plan.

## 2015-04-09 MED ORDER — ZOLPIDEM TARTRATE 10 MG PO TABS: 10.0000 mg | ORAL_TABLET | Freq: Every evening | ORAL | Status: DC | PRN

## 2015-04-09 NOTE — Telephone Encounter (Signed)
Done hardcopy to Candyce Churntamara  Tamara to fax to [facility]

## 2015-04-09 NOTE — Addendum Note (Signed)
Addended by: Corwin LevinsJOHN, Abby Tucholski W on: 04/09/2015 01:11 PM   Modules accepted: Orders

## 2015-04-10 ENCOUNTER — Encounter: Payer: Self-pay | Admitting: Internal Medicine

## 2015-04-10 ENCOUNTER — Ambulatory Visit (INDEPENDENT_AMBULATORY_CARE_PROVIDER_SITE_OTHER): Payer: Managed Care, Other (non HMO) | Admitting: Internal Medicine

## 2015-04-10 VITALS — BP 138/84 | HR 105 | Temp 98.5°F | Resp 16 | Wt 207.0 lb

## 2015-04-10 DIAGNOSIS — R05 Cough: Secondary | ICD-10-CM | POA: Insufficient documentation

## 2015-04-10 DIAGNOSIS — R109 Unspecified abdominal pain: Secondary | ICD-10-CM | POA: Diagnosis not present

## 2015-04-10 DIAGNOSIS — N10 Acute tubulo-interstitial nephritis: Secondary | ICD-10-CM

## 2015-04-10 DIAGNOSIS — R059 Cough, unspecified: Secondary | ICD-10-CM

## 2015-04-10 MED ORDER — ONDANSETRON HCL 4 MG PO TABS: 4.0000 mg | ORAL_TABLET | Freq: Three times a day (TID) | ORAL | Status: DC | PRN

## 2015-04-10 MED ORDER — ZOLPIDEM TARTRATE 10 MG PO TABS: 10.0000 mg | ORAL_TABLET | Freq: Every evening | ORAL | Status: DC | PRN

## 2015-04-10 MED ORDER — AZITHROMYCIN 250 MG PO TABS
ORAL_TABLET | ORAL | Status: DC
Start: 2015-04-10 — End: 2016-09-20

## 2015-04-10 MED ORDER — CEFDINIR 300 MG PO CAPS: 300.0000 mg | ORAL_CAPSULE | Freq: Two times a day (BID) | ORAL | Status: DC

## 2015-04-10 NOTE — Progress Notes (Signed)
Subjective:    Patient ID: Terri Fletcher, female    DOB: 1960-08-14, 54 y.o.   MRN: [id number]  HPI  Here to f/u with c/o worsening cough nonprod, temp up to 101 at home, sob/doe in the past 1-2 wks.  Pt denies chest pain, wheezing, orthopnea, PND, increased LE swelling, palpitations, dizziness or syncope. Pt denies new neurological symptoms such as new headache, or facial or extremity weakness or numbness   Pt denies polydipsia, polyuria, or low sugar symptoms such as weakness or confusion improved with po intake.  Pt states overall good compliance with meds, trying to follow lower cholesterol, diabetic diet, wt overall stable but little exercise however.   Denies urinary symptoms such as dysuria, frequency, urgency, flank pain, hematuria or n/v, fever, chills.  Was tx recently initially with cipro for UTi but only took intermittently due to GI upset.  Did take 2 doses flagyl but could not understand why she was taking it so stopped yesterday.   Past Medical History  Diagnosis Date  . ALLERGIC RHINITIS   . ANXIETY, SITUATIONAL   . ASTHMA   . COMMON MIGRAINE   . DIARRHEA, RECURRENT   . INSOMNIA, PERSISTENT   . Impaired glucose tolerance 09/25/2012   Past Surgical History  Procedure Laterality Date  . Cesarean section  06/25/1982  . Cesarean section  11/25/1978  . Breast surgery  11/05/2012    excision left breast mass    reports that she has quit smoking. Her smoking use included Cigarettes. She has never used smokeless tobacco. She reports that she drinks alcohol. She reports that she does not use illicit drugs. family history includes Cancer in her maternal grandmother and mother; Heart attack in her maternal grandfather. Allergies  Allergen Reactions  . Haldol [Haloperidol Lactate] Other (See Comments)    Lock Jaw  . Cefuroxime Axetil     REACTION: nausea   Current Outpatient Prescriptions on File Prior to Visit  Medication Sig Dispense Refill  . acetaminophen (TYLENOL) 325 MG tablet  Take 650 mg by mouth every 6 (six) hours as needed.    Marland Kitchen albuterol (PROAIR HFA) 108 (90 BASE) MCG/ACT inhaler INHALE TWO PUFFS BY MOUTH FOUR TIMES DAILY FOR WHEEZING 8.5 each 11  . ALPRAZolam (XANAX) 0.5 MG tablet Take 1 tablet (0.5 mg total) by mouth 2 (two) times daily as needed. 180 tablet 0  . ciprofloxacin (CIPRO) 500 MG tablet Take 1 tablet (500 mg total) by mouth 2 (two) times daily. (Patient not taking: Reported on 04/10/2015) 20 tablet 0  . metroNIDAZOLE (FLAGYL) 500 MG tablet Take 1 tablet (500 mg total) by mouth 3 (three) times daily. (Patient not taking: Reported on 04/10/2015) 15 tablet 0   No current facility-administered medications on file prior to visit.   Review of Systems  Constitutional: Negative for unusual diaphoresis or night sweats HENT: Negative for ringing in ear or discharge Eyes: Negative for double vision or worsening visual disturbance.  Respiratory: Negative for choking and stridor.   Gastrointestinal: Negative for vomiting or other signifcant bowel change Genitourinary: Negative for hematuria or change in urine volume.  Musculoskeletal: Negative for other MSK pain or swelling Skin: Negative for color change and worsening wound.  Neurological: Negative for tremors and numbness other than noted  Psychiatric/Behavioral: Negative for decreased concentration or agitation other than above       Objective:   Physical Exam BP 138/84 mmHg  Pulse 105  Temp(Src) 98.5 F (36.9 C) (Oral)  Resp 16  Wt 207 lb (  93.895 kg)  SpO2 98% VS noted,  Constitutional: Pt appears in no significant distress HENT: Head: NCAT.  Right Ear: External ear normal.  Left Ear: External ear normal.  Eyes: . Pupils are equal, round, and reactive to light. Conjunctivae and EOM are normal Neck: Normal range of motion. Neck supple.  Cardiovascular: Normal rate and regular rhythm.   Pulmonary/Chest: Effort normal and breath sounds decreased bilat without rales or wheezing.  Abd:  Soft,  NT, ND, + BS Neurological: Pt is alert. Not confused , motor grossly intact Skin: Skin is warm. No rash, no LE edema Psychiatric: Pt behavior is normal. No agitation.   Culture KLEBSIELLA PNEUMONIAE   Colony Count 50,000 COLONIES/ML   Organism ID, Bacteria KLEBSIELLA PNEUMONIAE   Resulting Agency [facility]    Culture & Susceptibility      KLEBSIELLA PNEUMONIAE     Antibiotic Sensitivity Microscan Status    AMOX/CLAVULANIC Sensitive <=2 Final    AMPICILLIN Resistant >=32 Final    AMPICILLIN/SULBACTAM Sensitive 4 Final    CEFAZOLIN Not Reportable <=4 Final    CEFEPIME Sensitive <=1 Final    CEFTAZIDIME Sensitive <=1 Final    CEFTRIAXONE Sensitive <=1 Final    CIPROFLOXACIN Sensitive <=0.25 Final    GENTAMICIN Sensitive <=1 Final    IMIPENEM Sensitive <=0.25 Final    LEVOFLOXACIN Sensitive <=0.12 Final    NITROFURANTOIN Sensitive 32 Final    PIP/TAZO Sensitive <=4 Final    TOBRAMYCIN Sensitive <=1 Final    TRIMETH/SULFA Sensitive <=20 Final          CLINICAL DATA: Shortness of breath.  EXAM: CHEST 2 VIEW  COMPARISON: None.  FINDINGS: Mediastinum hilar structures normal. Diffuse mild interstitial prominence noted. Mild pneumonitis cannot be excluded. No pleural effusion or pneumothorax. Heart size normal. No acute bony abnormality .  IMPRESSION: Mild bilateral pulmonary interstitial prominence consistent with mild pneumonitis.   Electronically Signed  By: Maisie Fushomas Register  On: 04/06/2015 11:39     Assessment & Plan:

## 2015-04-10 NOTE — Assessment & Plan Note (Signed)
?   Atypical pna vs other; Mild to mod, for antibx course,  to f/u any worsening symptoms or concerns

## 2015-04-10 NOTE — Progress Notes (Signed)
Pre visit review using our clinic review tool, if applicable. No additional management support is needed unless otherwise documented below in the visit note. 

## 2015-04-10 NOTE — Assessment & Plan Note (Signed)
Non significant complaint today, unclear if c/w diverticultiis or other, would hold on flagyl at this time, as well as hold imaging

## 2015-04-10 NOTE — Assessment & Plan Note (Signed)
Resolved, no further cipro felt needed,  to f/u any worsening symptoms or concerns

## 2015-04-10 NOTE — Patient Instructions (Signed)
Please take all new medication as prescribed - the two antibiotics  Please continue all other medications as before, and refills have been done if requested.  Please have the pharmacy call with any other refills you may need.  Please continue your efforts at being more active, low cholesterol diet, and weight control.  Please keep your appointments with your specialists as you may have planned

## 2015-04-21 ENCOUNTER — Other Ambulatory Visit: Payer: Self-pay | Admitting: Internal Medicine

## 2015-04-22 ENCOUNTER — Encounter: Payer: Self-pay | Admitting: Internal Medicine

## 2015-04-22 NOTE — Telephone Encounter (Signed)
Xanax too soon, as was just done Apr 21, 2015

## 2015-04-22 NOTE — Addendum Note (Signed)
Addended by: Corwin LevinsJOHN, Malayasia Mirkin W on: 04/22/2015 12:42 PM   Modules accepted: Orders

## 2015-04-24 ENCOUNTER — Telehealth: Payer: Self-pay | Admitting: Internal Medicine

## 2015-04-24 ENCOUNTER — Other Ambulatory Visit: Payer: Self-pay

## 2015-04-24 MED ORDER — ALPRAZOLAM 0.5 MG PO TABS: 0.5000 mg | ORAL_TABLET | Freq: Two times a day (BID) | ORAL | Status: DC | PRN

## 2015-04-24 NOTE — Telephone Encounter (Signed)
Rx faxed to pharmacy  

## 2015-04-24 NOTE — Telephone Encounter (Signed)
States she received email that Dr. Jonny RuizJohn had written script for her and that the office would call for pickup.  I did not see in cabinet.  Can you research this?

## 2015-04-24 NOTE — Telephone Encounter (Signed)
Done hardcopy to Dahlia  

## 2015-04-24 NOTE — Telephone Encounter (Signed)
Gave patient response °

## 2015-04-24 NOTE — Telephone Encounter (Signed)
Sorry, Rx was refilled and faxed to pt's [facility]. Thanks

## 2015-04-24 NOTE — Telephone Encounter (Signed)
Dr. Jonny RuizJohn you denied this refill Nov 1st, please advise

## 2015-09-16 ENCOUNTER — Encounter (INDEPENDENT_AMBULATORY_CARE_PROVIDER_SITE_OTHER): Payer: Self-pay

## 2015-10-06 ENCOUNTER — Encounter: Payer: Self-pay | Admitting: Internal Medicine

## 2015-10-06 MED ORDER — ZOLPIDEM TARTRATE 10 MG PO TABS: 10.0000 mg | ORAL_TABLET | Freq: Every evening | ORAL | Status: DC | PRN

## 2015-10-06 NOTE — Telephone Encounter (Signed)
Done hardcopy to Corinne  

## 2015-10-09 NOTE — Telephone Encounter (Signed)
Corinne, has the hardcopy from apr 18 been [facility] to [facility]? thanks

## 2015-10-12 ENCOUNTER — Other Ambulatory Visit: Payer: Self-pay

## 2015-10-12 NOTE — Telephone Encounter (Signed)
Please advise, 90 day supply through mail order

## 2015-10-13 ENCOUNTER — Encounter: Payer: Self-pay | Admitting: Internal Medicine

## 2015-10-16 ENCOUNTER — Encounter: Payer: Self-pay | Admitting: Internal Medicine

## 2015-10-16 MED ORDER — ZOLPIDEM TARTRATE 10 MG PO TABS: 10.0000 mg | ORAL_TABLET | Freq: Every evening | ORAL | Status: DC | PRN

## 2015-10-16 NOTE — Telephone Encounter (Signed)
I am confused since the ambien done apr 18 was for #90 x 1 refill to go to [facility]  Corinne to verify this  If this was not done correctly, let me know

## 2015-10-16 NOTE — Telephone Encounter (Signed)
Done hardcopy to Corinne  

## 2015-10-16 NOTE — Telephone Encounter (Signed)
Medication has been faxed to pharmacy at [facility]

## 2015-10-26 ENCOUNTER — Other Ambulatory Visit: Payer: Self-pay | Admitting: Internal Medicine

## 2015-10-26 NOTE — Telephone Encounter (Signed)
Please advise, thanks.

## 2015-10-27 NOTE — Telephone Encounter (Signed)
Done hardcopy to Corinne  

## 2015-10-27 NOTE — Telephone Encounter (Signed)
Medication faxed to pharmacy 

## 2016-04-04 ENCOUNTER — Encounter: Payer: Self-pay | Admitting: Internal Medicine

## 2016-04-06 ENCOUNTER — Ambulatory Visit (INDEPENDENT_AMBULATORY_CARE_PROVIDER_SITE_OTHER): Payer: Managed Care, Other (non HMO) | Admitting: Internal Medicine

## 2016-04-06 DIAGNOSIS — F411 Generalized anxiety disorder: Secondary | ICD-10-CM | POA: Diagnosis not present

## 2016-04-06 DIAGNOSIS — J4521 Mild intermittent asthma with (acute) exacerbation: Secondary | ICD-10-CM

## 2016-04-06 DIAGNOSIS — R05 Cough: Secondary | ICD-10-CM | POA: Diagnosis not present

## 2016-04-06 DIAGNOSIS — R059 Cough, unspecified: Secondary | ICD-10-CM

## 2016-04-06 MED ORDER — ZOLPIDEM TARTRATE 10 MG PO TABS: 10.0000 mg | ORAL_TABLET | Freq: Every evening | ORAL | 1 refills | Status: DC | PRN

## 2016-04-06 MED ORDER — PREDNISONE 10 MG PO TABS: ORAL_TABLET | ORAL | 0 refills | Status: DC

## 2016-04-06 MED ORDER — ALBUTEROL SULFATE HFA 108 (90 BASE) MCG/ACT IN AERS: INHALATION_SPRAY | RESPIRATORY_TRACT | 11 refills | Status: DC

## 2016-04-06 MED ORDER — LEVOFLOXACIN 500 MG PO TABS: 500.0000 mg | ORAL_TABLET | Freq: Every day | ORAL | 0 refills | Status: AC

## 2016-04-06 NOTE — Progress Notes (Signed)
Pre visit review using our clinic review tool, if applicable. No additional management support is needed unless otherwise documented below in the visit note. 

## 2016-04-06 NOTE — Assessment & Plan Note (Signed)
stable overall by history and exam, recent data reviewed with pt, and pt to continue medical treatment as before,  to f/u any worsening symptoms or concerns, declines lab eval this year.

## 2016-04-06 NOTE — Progress Notes (Signed)
Subjective:    Patient ID: Terri MaduroSusan Tamm, female    DOB: 07/20/1960, 55 y.o.   MRN: [id number]  HPI   Here with 1 wk acute onset fever, left > right ear pain, pressure, headache, general weakness and malaise, with mild ST and non prod cough and onset mild wheezing and sob x 2 days better with increased use of inhaler,, but pt denies chest pain, orthopnea, PND, increased LE swelling, palpitations, dizziness or syncope.  Pt denies new neurological symptoms such as new headache, or facial or extremity weakness or numbness   Pt denies polydipsia, polyuria  Denies worsening depressive symptoms, suicidal ideation, or panic; has ongoing anxiety, due for refill med.   Past Medical History:  Diagnosis Date  . ALLERGIC RHINITIS   . ANXIETY, SITUATIONAL   . ASTHMA   . COMMON MIGRAINE   . DIARRHEA, RECURRENT   . Impaired glucose tolerance 09/25/2012  . INSOMNIA, PERSISTENT    Past Surgical History:  Procedure Laterality Date  . BREAST SURGERY  11/05/2012   excision left breast mass  . CESAREAN SECTION  06/25/1982  . CESAREAN SECTION  11/25/1978    reports that she has quit smoking. Her smoking use included Cigarettes. She has never used smokeless tobacco. She reports that she drinks alcohol. She reports that she does not use drugs. family history includes Cancer in her maternal grandmother and mother; Heart attack in her maternal grandfather. Allergies  Allergen Reactions  . Haldol [Haloperidol Lactate] Other (See Comments)    Lock Jaw  . Cefuroxime Axetil     REACTION: nausea   Current Outpatient Prescriptions on File Prior to Visit  Medication Sig Dispense Refill  . acetaminophen (TYLENOL) 325 MG tablet Take 650 mg by mouth every 6 (six) hours as needed.    . ALPRAZolam (XANAX) 0.5 MG tablet TAKE 1 TABLET BY MOUTH TWICE A DAY AS NEEDED 180 tablet 1  . azithromycin (ZITHROMAX Z-PAK) 250 MG tablet Use as directed 6 tablet 1  . metroNIDAZOLE (FLAGYL) 500 MG tablet Take 1 tablet (500 mg total) by  mouth 3 (three) times daily. 15 tablet 0  . ondansetron (ZOFRAN) 4 MG tablet Take 1 tablet (4 mg total) by mouth every 8 (eight) hours as needed for nausea or vomiting. 30 tablet 1   No current facility-administered medications on file prior to visit.    Review of Systems All otherwise neg per pt     Objective:   Physical Exam BP 136/74   Pulse 81   Resp (!) 96   Wt 213 lb (96.6 kg)   BMI 29.29 kg/m  VS noted,  Constitutional: Pt appears in no apparent distress HENT: Head: NCAT.  Right Ear: External ear normal.  Left Ear: External ear normal.  Bilat tm's with mild ot mod erythema left > right,.  Max sinus areas mild tender.  Pharynx with mild erythema, no exudate Eyes: . Pupils are equal, round, and reactive to light. Conjunctivae and EOM are normal Neck: Normal range of motion. Neck supple.  Cardiovascular: Normal rate and regular rhythm.   Pulmonary/Chest: Effort normal and breath sounds without rales but with mild scattered  wheezing.  Neurological: Pt is alert. Not confused , motor grossly intact Skin: Skin is warm. No rash, no LE edema Psychiatric: Pt behavior is normal. No agitation. mild nervous, not depressed affect  Lab Results  Component Value Date   WBC 6.3 04/06/2015   HGB 13.2 04/06/2015   HCT 39.2 04/06/2015   PLT 184.0 04/06/2015  GLUCOSE 105 (H) 04/06/2015   ALT 48 (H) 04/06/2015   AST 38 (H) 04/06/2015   NA 138 04/06/2015   K 3.5 04/06/2015   CL 103 04/06/2015   CREATININE 0.91 04/06/2015   BUN 9 04/06/2015   CO2 27 04/06/2015   TSH 0.65 03/10/2014   HGBA1C 5.9 09/26/2012       Assessment & Plan:

## 2016-04-06 NOTE — Assessment & Plan Note (Signed)
C/w URI/bronchitis, viral vs other, for trial antibx, delsym otc prn, declines cxr,  to f/u any worsening symptoms or concerns

## 2016-04-06 NOTE — Assessment & Plan Note (Signed)
Mild to mod, for predpac asd,  Cont inhaler prn,  to f/u any worsening symptoms or concerns

## 2016-04-06 NOTE — Patient Instructions (Addendum)
Please take all new medication as prescribed - the antibiotic, and the prednisone  Please continue all other medications as before, and refills have been done if requested.  Please have the pharmacy call with any other refills you may need.  Please keep your appointments with your specialists as you may have planned  Please return in 1 year for your yearly visit, or sooner if needed

## 2016-04-28 ENCOUNTER — Other Ambulatory Visit: Payer: Self-pay | Admitting: Internal Medicine

## 2016-04-28 NOTE — Telephone Encounter (Signed)
Done hardcopy to Corinne  

## 2016-04-28 NOTE — Telephone Encounter (Signed)
faxed

## 2016-09-20 ENCOUNTER — Encounter: Payer: Self-pay | Admitting: Internal Medicine

## 2016-09-20 ENCOUNTER — Ambulatory Visit (INDEPENDENT_AMBULATORY_CARE_PROVIDER_SITE_OTHER): Payer: Managed Care, Other (non HMO) | Admitting: Internal Medicine

## 2016-09-20 ENCOUNTER — Telehealth: Payer: Self-pay | Admitting: Internal Medicine

## 2016-09-20 VITALS — BP 152/96 | HR 82 | Temp 98.4°F | Ht 72.0 in | Wt 210.0 lb

## 2016-09-20 DIAGNOSIS — J019 Acute sinusitis, unspecified: Secondary | ICD-10-CM

## 2016-09-20 DIAGNOSIS — R1032 Left lower quadrant pain: Secondary | ICD-10-CM

## 2016-09-20 DIAGNOSIS — J45901 Unspecified asthma with (acute) exacerbation: Secondary | ICD-10-CM

## 2016-09-20 MED ORDER — METRONIDAZOLE 250 MG PO TABS: 250.0000 mg | ORAL_TABLET | Freq: Three times a day (TID) | ORAL | 0 refills | Status: DC

## 2016-09-20 MED ORDER — TRAMADOL HCL 50 MG PO TABS: 50.0000 mg | ORAL_TABLET | Freq: Three times a day (TID) | ORAL | 1 refills | Status: DC | PRN

## 2016-09-20 MED ORDER — PREDNISONE 10 MG PO TABS: ORAL_TABLET | ORAL | 0 refills | Status: DC

## 2016-09-20 MED ORDER — METRONIDAZOLE 250 MG PO TABS: 250.0000 mg | ORAL_TABLET | Freq: Three times a day (TID) | ORAL | 0 refills | Status: AC

## 2016-09-20 MED ORDER — LEVOFLOXACIN 500 MG PO TABS: 500.0000 mg | ORAL_TABLET | Freq: Every day | ORAL | 0 refills | Status: DC

## 2016-09-20 MED ORDER — LEVOFLOXACIN 500 MG PO TABS
500.0000 mg | ORAL_TABLET | Freq: Every day | ORAL | 0 refills | Status: DC
Start: 2016-09-20 — End: 2016-09-20

## 2016-09-20 NOTE — Progress Notes (Signed)
Pre visit review using our clinic review tool, if applicable. No additional management support is needed unless otherwise documented below in the visit note. 

## 2016-09-20 NOTE — Telephone Encounter (Signed)
I resent the rx to [facility]

## 2016-09-20 NOTE — Progress Notes (Signed)
Subjective:    Patient ID: Terri Fletcher, female    DOB: 1961/04/24, 56 y.o.   MRN: [id number]  HPI  Here after seen at [facility] mar 31, dx with influenza like illness and acute diverticulitis, tx with augmentin; hx was c/w sinus involvement as well as LLQ pain and loose stools.  Unfortunately her symptoms have not improved, in fact now seems to have gone to the chest with mild wheezing, sob, non prod cough, doe.  Pt denies chest pain, orthopnea, PND, increased LE swelling, palpitations, dizziness or syncope. Has felt warm, not sure about fever. No chills or blood. Past Medical History:  Diagnosis Date  . ALLERGIC RHINITIS   . ANXIETY, SITUATIONAL   . ASTHMA   . COMMON MIGRAINE   . DIARRHEA, RECURRENT   . Impaired glucose tolerance 09/25/2012  . INSOMNIA, PERSISTENT    Past Surgical History:  Procedure Laterality Date  . BREAST SURGERY  11/05/2012   excision left breast mass  . CESAREAN SECTION  06/25/1982  . CESAREAN SECTION  11/25/1978    reports that she has quit smoking. Her smoking use included Cigarettes. She has never used smokeless tobacco. She reports that she drinks alcohol. She reports that she does not use drugs. family history includes Cancer in her maternal grandmother and mother; Heart attack in her maternal grandfather. Allergies  Allergen Reactions  . Haldol [Haloperidol Lactate] Other (See Comments)    Lock Jaw  . Cefuroxime Axetil     REACTION: nausea   Current Outpatient Prescriptions on File Prior to Visit  Medication Sig Dispense Refill  . acetaminophen (TYLENOL) 325 MG tablet Take 650 mg by mouth every 6 (six) hours as needed.    Marland Kitchen albuterol (PROAIR HFA) 108 (90 Base) MCG/ACT inhaler INHALE TWO PUFFS BY MOUTH FOUR TIMES DAILY FOR WHEEZING 8.5 each 11  . ALPRAZolam (XANAX) 0.5 MG tablet TAKE 1 TABLET BY MOUTH TWICE A DAY AS NEEDED 180 tablet 1  . zolpidem (AMBIEN) 10 MG tablet Take 1 tablet (10 mg total) by mouth at bedtime as needed for sleep. 90 tablet 1    No current facility-administered medications on file prior to visit.    Review of Systems  Constitutional: Negative for other unusual diaphoresis or sweats HENT: Negative for ear discharge or swelling Eyes: Negative for other worsening visual disturbances Respiratory: Negative for stridor or other swelling  Gastrointestinal: Negative for worsening distension or other blood Genitourinary: Negative for retention or other urinary change Musculoskeletal: Negative for other MSK pain or swelling Skin: Negative for color change or other new lesions Neurological: Negative for worsening tremors and other numbness  Psychiatric/Behavioral: Negative for worsening agitation or other fatigue All other system neg per pt    Objective:   Physical Exam BP (!) 152/96   Pulse 82   Temp 98.4 F (36.9 C) (Oral)   Ht 6' (1.829 m)   Wt 210 lb (95.3 kg)   SpO2 98%   BMI 28.48 kg/m  VS noted,  Constitutional: Pt appears in NAD HENT: Head: NCAT.  Right Ear: External ear normal.  Left Ear: External ear normal. Bilat tm's with mild erythema.  Max sinus areas marked tender.  Pharynx with mild erythema, no exudate  Eyes: . Pupils are equal, round, and reactive to light. Conjunctivae and EOM are normal Nose: without d/c or deformity Neck: Neck supple. Gross normal ROM Cardiovascular: Normal rate and regular rhythm.   Pulmonary/Chest: Effort normal and breath sounds decreased without rales but with mild scattered bilat  wheezing.  Abd:  Soft, with mild left sided abd tender without guarding or rebound, o/w ND, + BS, no organomegaly Neurological: Pt is alert. At baseline orientation, motor grossly intact Skin: Skin is warm. No rashes, other new lesions, no LE edema Psychiatric: Pt behavior is normal without agitation  No other exam findings    Assessment & Plan:

## 2016-09-20 NOTE — Patient Instructions (Addendum)
Ok to stop the augmentin  Please take all new medication as prescribed- the levaquin and flagyl antibiotics, and tramadol for pain, and prednisone  Please continue all other medications as before, and refills have been done if requested.  Please have the pharmacy call with any other refills you may need.  Please keep your appointments with your specialists as you may have planned  Please go to the ER at any time or the next 1-2 days if worse or not improving, such as worse pain, fevers, chills, weakness, or falls

## 2016-09-20 NOTE — Telephone Encounter (Signed)
Patient is requesting a call back in regard to directions on flagyl.  States when she picked up from pharmacy directions was to take 3 pills per day but patient states she was only given 4 pills.  Please follow up in regard.

## 2016-09-21 ENCOUNTER — Encounter: Payer: Self-pay | Admitting: Internal Medicine

## 2016-09-21 MED ORDER — ONDANSETRON HCL 4 MG PO TABS: 4.0000 mg | ORAL_TABLET | Freq: Three times a day (TID) | ORAL | 0 refills | Status: DC | PRN

## 2016-09-21 MED ORDER — LEVOFLOXACIN 500 MG PO TABS: 500.0000 mg | ORAL_TABLET | Freq: Every day | ORAL | 0 refills | Status: AC

## 2016-09-26 ENCOUNTER — Encounter: Payer: Self-pay | Admitting: Internal Medicine

## 2016-09-26 DIAGNOSIS — J45901 Unspecified asthma with (acute) exacerbation: Secondary | ICD-10-CM | POA: Insufficient documentation

## 2016-09-26 NOTE — Assessment & Plan Note (Signed)
Mild, for predpac asd,  to f/u any worsening symptoms or concerns 

## 2016-09-26 NOTE — Assessment & Plan Note (Signed)
Mild to mod, for antibx course,  to f/u any worsening symptoms or concerns 

## 2016-09-26 NOTE — Assessment & Plan Note (Signed)
Etiology unclear, in retrospect could have been viral related and now could even be augmentin related; pt not interested in stool studies, out of abundance of caution will change to levaquin and flagyl, and will cont to monitor,  to f/u any worsening symptoms or concerns

## 2016-09-27 ENCOUNTER — Encounter: Payer: Self-pay | Admitting: Internal Medicine

## 2016-09-27 MED ORDER — ZOLPIDEM TARTRATE 10 MG PO TABS: 10.0000 mg | ORAL_TABLET | Freq: Every evening | ORAL | 1 refills | Status: DC | PRN

## 2016-09-27 NOTE — Telephone Encounter (Signed)
Done hardcopy to Shirron  - [facility]

## 2016-10-19 ENCOUNTER — Ambulatory Visit (INDEPENDENT_AMBULATORY_CARE_PROVIDER_SITE_OTHER)
Admission: RE | Admit: 2016-10-19 | Discharge: 2016-10-19 | Disposition: A | Discharge status: Home / Self Care | Payer: Managed Care, Other (non HMO) | Source: Ambulatory Visit | Attending: Internal Medicine | Admitting: Internal Medicine

## 2016-10-19 ENCOUNTER — Ambulatory Visit (INDEPENDENT_AMBULATORY_CARE_PROVIDER_SITE_OTHER): Payer: Managed Care, Other (non HMO) | Admitting: Internal Medicine

## 2016-10-19 ENCOUNTER — Other Ambulatory Visit (INDEPENDENT_AMBULATORY_CARE_PROVIDER_SITE_OTHER): Payer: Managed Care, Other (non HMO)

## 2016-10-19 ENCOUNTER — Encounter: Payer: Self-pay | Admitting: Internal Medicine

## 2016-10-19 VITALS — BP 142/82 | HR 80 | Ht 72.0 in | Wt 208.0 lb

## 2016-10-19 DIAGNOSIS — M5416 Radiculopathy, lumbar region: Secondary | ICD-10-CM

## 2016-10-19 DIAGNOSIS — J45909 Unspecified asthma, uncomplicated: Secondary | ICD-10-CM | POA: Diagnosis not present

## 2016-10-19 DIAGNOSIS — F411 Generalized anxiety disorder: Secondary | ICD-10-CM | POA: Diagnosis not present

## 2016-10-19 DIAGNOSIS — Z0001 Encounter for general adult medical examination with abnormal findings: Secondary | ICD-10-CM | POA: Diagnosis not present

## 2016-10-19 DIAGNOSIS — R1032 Left lower quadrant pain: Secondary | ICD-10-CM

## 2016-10-19 LAB — BASIC METABOLIC PANEL
BUN: 13 mg/dL (ref 6–23)
CHLORIDE: 105 meq/L (ref 96–112)
CO2: 28 meq/L (ref 19–32)
Calcium: 10.3 mg/dL (ref 8.4–10.5)
Creatinine, Ser: 0.83 mg/dL (ref 0.40–1.20)
GFR: 75.29 mL/min (ref >60.00)
GLUCOSE: 110 mg/dL — AB (ref 70–99)
POTASSIUM: 4.1 meq/L (ref 3.5–5.1)
SODIUM: 139 meq/L (ref 135–145)

## 2016-10-19 LAB — HEPATIC FUNCTION PANEL
ALBUMIN: 4.9 g/dL (ref 3.5–5.2)
ALK PHOS: 62 U/L (ref 39–117)
ALT: 26 U/L (ref 0–35)
AST: 20 U/L (ref 0–37)
Bilirubin, Direct: 0.1 mg/dL (ref 0.0–0.3)
Total Bilirubin: 0.5 mg/dL (ref 0.2–1.2)
Total Protein: 7.4 g/dL (ref 6.0–8.3)

## 2016-10-19 LAB — CBC WITH DIFFERENTIAL/PLATELET
BASOS PCT: 0.7 % (ref 0.0–3.0)
Basophils Absolute: 0 10*3/uL (ref 0.0–0.1)
Eosinophils Absolute: 0 10*3/uL (ref 0.0–0.7)
Eosinophils Relative: 0.3 % (ref 0.0–5.0)
HEMATOCRIT: 43.2 % (ref 36.0–46.0)
Hemoglobin: 14.4 g/dL (ref 12.0–15.0)
Lymphocytes Relative: 44.4 % (ref 12.0–46.0)
Lymphs Abs: 3 10*3/uL (ref 0.7–4.0)
MCHC: 33.4 g/dL (ref 30.0–36.0)
MCV: 91.3 fl (ref 78.0–100.0)
Monocytes Absolute: 0.5 10*3/uL (ref 0.1–1.0)
Monocytes Relative: 6.8 % (ref 3.0–12.0)
NEUTROS ABS: 3.2 10*3/uL (ref 1.4–7.7)
Neutrophils Relative %: 47.8 % (ref 43.0–77.0)
PLATELETS: 233 10*3/uL (ref 150.0–400.0)
RBC: 4.73 Mil/uL (ref 3.87–5.11)
RDW: 13.4 % (ref 11.5–15.5)
WBC: 6.7 10*3/uL (ref 4.0–10.5)

## 2016-10-19 LAB — URINALYSIS, ROUTINE W REFLEX MICROSCOPIC
BILIRUBIN URINE: NEGATIVE
KETONES UR: NEGATIVE
LEUKOCYTES UA: NEGATIVE
NITRITE: NEGATIVE
PH: 6 (ref 5.0–8.0)
Specific Gravity, Urine: >=1.03 — AB (ref 1.000–1.030)
TOTAL PROTEIN, URINE-UPE24: NEGATIVE
URINE GLUCOSE: NEGATIVE
UROBILINOGEN UA: 0.2 (ref 0.0–1.0)

## 2016-10-19 LAB — LIPID PANEL
CHOLESTEROL: 171 mg/dL (ref 0–200)
HDL: 53.2 mg/dL (ref >39.00)
LDL CALC: 85 mg/dL (ref 0–99)
NonHDL: 117.91
Total CHOL/HDL Ratio: 3
Triglycerides: 167 mg/dL — ABNORMAL HIGH (ref 0.0–149.0)
VLDL: 33.4 mg/dL (ref 0.0–40.0)

## 2016-10-19 LAB — TSH: TSH: 2.19 u[IU]/mL (ref 0.35–4.50)

## 2016-10-19 MED ORDER — GABAPENTIN 100 MG PO CAPS: ORAL_CAPSULE | ORAL | 0 refills | Status: DC

## 2016-10-19 MED ORDER — GABAPENTIN 300 MG PO CAPS: ORAL_CAPSULE | ORAL | 5 refills | Status: DC

## 2016-10-19 NOTE — Patient Instructions (Signed)
Please take all new medication as prescribed - gabapentin at 100 mg three times daily for 1 wk, then 300 mg prescription after that  Please continue all other medications as before, and refills have been done if requested.  Please have the pharmacy call with any other refills you may need.  Please continue your efforts at being more active, low cholesterol diet, and weight control.  You are otherwise up to date with prevention measures today.  Please keep your appointments with your specialists as you may have planned  You will be contacted regarding the referral for: MRI, and Dr Katrinka Blazing  Please go to the XRAY Department in the Basement (go straight as you get off the elevator) for the x-ray testing - the hip xray  Please go to the LAB in the Basement (turn left off the elevator) for the tests to be done today  You will be contacted by phone if any changes need to be made immediately.  Otherwise, you will receive a letter about your results with an explanation, but please check with MyChart first.  Please remember to sign up for MyChart if you have not done so, as this will be important to you in the future with finding out test results, communicating by private email, and scheduling acute appointments online when needed.

## 2016-10-19 NOTE — Progress Notes (Signed)
Pre visit review using our clinic review tool, if applicable. No additional management support is needed unless otherwise documented below in the visit note. 

## 2016-10-19 NOTE — Progress Notes (Signed)
Subjective:    Patient ID: Terri Fletcher, female    DOB: 1960/12/02, 56 y.o.   MRN: [id number]   HPI Here for wellness and f/u;  Overall doing ok;  Pt denies Chest pain, worsening SOB, DOE, wheezing, orthopnea, PND, worsening LE edema, palpitations, dizziness or syncope.  Pt denies neurological change such as new headache, facial or extremity weakness.  Pt denies polydipsia, polyuria, or low sugar symptoms. Pt states overall good compliance with treatment and medications, good tolerability, and has been trying to follow appropriate diet.  Pt denies worsening depressive symptoms, suicidal ideation or panic. No fever, night sweats, wt loss, loss of appetite, or other constitutional symptoms.  Pt states good ability with ADL's, has low fall risk, home safety reviewed and adequate, no other significant changes in hearing or vision, and only occasionally active with exercise, especially recently with back pain. Delcines immunizations, colonoscopy.   Pt continues to experience left lower back pain but no bowel or bladder change, fever, wt loss,  or falls, but now assoc with increase LLE pain, numbness and intermittent weakness.  Also Still woith the LLQ pain - ? Referred lumbar pain, not well improved with tx fo diverticulitis, Constant, mild persistent, with pain, numbness and intermittent weakness LLE.  Today willing to do whatever asked for improved condition as she has been severely needle phobic in the past, including IV placement contrast.   Last MRI lumbar 2014 with lumbar DJD,DDD with several areas of concern at that time even of possible nerve root imvolement. Past Medical History:  Diagnosis Date  . ALLERGIC RHINITIS   . ANXIETY, SITUATIONAL   . ASTHMA   . COMMON MIGRAINE   . DIARRHEA, RECURRENT   . Impaired glucose tolerance 09/25/2012  . INSOMNIA, PERSISTENT    Past Surgical History:  Procedure Laterality Date  . BREAST SURGERY  11/05/2012   excision left breast mass  . CESAREAN SECTION   06/25/1982  . CESAREAN SECTION  11/25/1978    reports that she has quit smoking. Her smoking use included Cigarettes. She has never used smokeless tobacco. She reports that she drinks alcohol. She reports that she does not use drugs. family history includes Cancer in her maternal grandmother and mother; Heart attack in her maternal grandfather. Allergies  Allergen Reactions  . Haldol [Haloperidol Lactate] Other (See Comments)    Lock Jaw  . Cefuroxime Axetil     REACTION: nausea   Current Outpatient Prescriptions on File Prior to Visit  Medication Sig Dispense Refill  . acetaminophen (TYLENOL) 325 MG tablet Take 650 mg by mouth every 6 (six) hours as needed.    Marland Kitchen albuterol (PROAIR HFA) 108 (90 Base) MCG/ACT inhaler INHALE TWO PUFFS BY MOUTH FOUR TIMES DAILY FOR WHEEZING 8.5 each 11  . ALPRAZolam (XANAX) 0.5 MG tablet TAKE 1 TABLET BY MOUTH TWICE A DAY AS NEEDED 180 tablet 1  . traMADol (ULTRAM) 50 MG tablet Take 1 tablet (50 mg total) by mouth every 8 (eight) hours as needed. 40 tablet 1  . zolpidem (AMBIEN) 10 MG tablet Take 1 tablet (10 mg total) by mouth at bedtime as needed for sleep. 90 tablet 1   No current facility-administered medications on file prior to visit.    Review of Systems Constitutional: Negative for other unusual diaphoresis, sweats, appetite or weight changes HENT: Negative for other worsening hearing loss, ear pain, facial swelling, mouth sores or neck stiffness.   Eyes: Negative for other worsening pain, redness or other visual disturbance.  Respiratory: Negative  for other stridor or swelling Cardiovascular: Negative for other palpitations or other chest pain  Gastrointestinal: Negative for worsening diarrhea or loose stools, blood in stool, distention or other pain Genitourinary: Negative for hematuria, flank pain or other change in urine volume.  Musculoskeletal: Negative for myalgias or other joint swelling.  Skin: Negative for other color change, or other  wound or worsening drainage.  Neurological: Negative for other syncope or numbness. Hematological: Negative for other adenopathy or swelling Psychiatric/Behavioral: Negative for hallucinations, other worsening agitation, SI, self-injury, or new decreased concentration All other system neg per pt    Objective:   Physical Exam BP (!) 142/82   Pulse 80   Ht 6' (1.829 m)   Wt 208 lb (94.3 kg)   SpO2 100%   BMI 28.21 kg/m  VS noted,  Constitutional: Pt is oriented to person, place, and time. Appears well-developed and well-nourished, in no significant distress and comfortable Head: Normocephalic and atraumatic  Eyes: Conjunctivae and EOM are normal. Pupils are equal, round, and reactive to light Right Ear: External ear normal without discharge Left Ear: External ear normal without discharge Nose: Nose without discharge or deformity Mouth/Throat: Oropharynx is without other ulcerations and moist  Neck: Normal range of motion. Neck supple. No JVD present. No tracheal deviation present or significant neck LA or mass Cardiovascular: Normal rate, regular rhythm, normal heart sounds and intact distal pulses.   Pulmonary/Chest: WOB normal and breath sounds without rales or wheezing  Abdominal: Soft. Bowel sounds are normal. NT. No HSM  Musculoskeletal: Normal range of motion. Exhibits no edema Lymphadenopathy: Has no other cervical adenopathy.  Neurological: Pt is alert and oriented to person, place, and time. Pt has normal reflexes. No cranial nerve deficit. Motor grossly intact except LLE 4+/5 motor, with dimished s1 reflex Skin: Skin is warm and dry. No rash noted or new ulcerations Psychiatric:  Has nervous mood and affect. Behavior is normal without agitation No other exam findings  02/28/2013 MRI LUMBAR SPINE WITHOUT CONTRAST IMPRESSION: Degenerative changes throughout the lumbar region as described above at each level.  In this patient with low back pain, the pain could result from  the degenerative disc disease as well as the mild facet arthropathy.  With respect to the left leg pain, neural compression could be occurring in the left lateral recess and intervertebral foramen on the left at L3-4.  Mild foraminal narrowing on the left at L4-5 could possibly affect the exiting L4 nerve root.  Foraminal narrowing at L5-S1 have potential to affect the exiting L5 nerve roots.    Assessment & Plan:

## 2016-10-19 NOTE — Assessment & Plan Note (Signed)
Mild situational worsening, declines further specific tx  At this time

## 2016-10-19 NOTE — Assessment & Plan Note (Addendum)
Etiology unclear, differential includes referred lumbar pain, msk pain such as left hip DJD or intra abd source such as overy; will pursue LS spine MRI, check labs as documented , for left hip plain film today, consider CT abd/pelvis if not revealing

## 2016-10-19 NOTE — Assessment & Plan Note (Signed)

## 2016-10-19 NOTE — Assessment & Plan Note (Addendum)
Mild to mod, with possible progression of prior known lumbar disc dz, for LS spine mri, consider surgical evaluation but declines for now  In addition to the time spent performing CPE, I spent an additional 25 minutes face to face,in which greater than 50% of this time was spent in counseling and coordination of care for patient's acute illness as documented, including back pain differential diagnosis, evaluation and management, possible relation to LLQ pain vs other etiology, and reassurance for anxiety

## 2016-10-19 NOTE — Assessment & Plan Note (Signed)
stable overall by history and exam, and pt to continue medical treatment as before,  to f/u any worsening symptoms or concerns 

## 2016-10-24 ENCOUNTER — Telehealth: Payer: Self-pay | Admitting: Internal Medicine

## 2016-10-24 NOTE — Telephone Encounter (Signed)
Very nice, I am gladm, thanks

## 2016-10-24 NOTE — Telephone Encounter (Signed)
Spoke to pt - she wanted to let you know that she has gotten great relief with the gabapentin (NEURONTIN) 300 MG capsule [[id number]][[id number]]

## 2016-10-26 ENCOUNTER — Other Ambulatory Visit: Payer: Self-pay | Admitting: Internal Medicine

## 2016-10-26 NOTE — Telephone Encounter (Signed)
Done hardcopy to Shirron  

## 2016-10-26 NOTE — Telephone Encounter (Signed)
faxed

## 2016-11-09 ENCOUNTER — Ambulatory Visit: Payer: Managed Care, Other (non HMO) | Admitting: Family Medicine

## 2017-02-17 ENCOUNTER — Telehealth: Payer: Managed Care, Other (non HMO) | Admitting: Nurse Practitioner

## 2017-02-17 DIAGNOSIS — R05 Cough: Secondary | ICD-10-CM

## 2017-02-17 DIAGNOSIS — R059 Cough, unspecified: Secondary | ICD-10-CM

## 2017-02-17 MED ORDER — PREDNISONE 10 MG (21) PO TBPK: ORAL_TABLET | ORAL | 0 refills | Status: DC

## 2017-02-17 NOTE — Progress Notes (Signed)

## 2017-03-22 ENCOUNTER — Encounter: Payer: Self-pay | Admitting: Internal Medicine

## 2017-03-23 MED ORDER — ZOLPIDEM TARTRATE 10 MG PO TABS: 10.0000 mg | ORAL_TABLET | Freq: Every evening | ORAL | 1 refills | Status: DC | PRN

## 2017-03-23 NOTE — Telephone Encounter (Signed)
Done hardcopy to Shirron - to sent to mail in pharmacy please - not [facility]

## 2017-04-20 ENCOUNTER — Ambulatory Visit (INDEPENDENT_AMBULATORY_CARE_PROVIDER_SITE_OTHER): Payer: Managed Care, Other (non HMO) | Admitting: Family Medicine

## 2017-04-20 ENCOUNTER — Encounter: Payer: Self-pay | Admitting: Internal Medicine

## 2017-04-20 ENCOUNTER — Encounter: Payer: Self-pay | Admitting: Family Medicine

## 2017-04-20 VITALS — BP 132/76 | HR 71 | Temp 98.4°F | Ht 72.0 in | Wt 204.0 lb

## 2017-04-20 DIAGNOSIS — J069 Acute upper respiratory infection, unspecified: Secondary | ICD-10-CM | POA: Insufficient documentation

## 2017-04-20 MED ORDER — ZOLPIDEM TARTRATE 10 MG PO TABS: 10.0000 mg | ORAL_TABLET | Freq: Every evening | ORAL | 1 refills | Status: DC | PRN

## 2017-04-20 NOTE — Assessment & Plan Note (Signed)
Symptoms are viral in nature. - Counseled on supportive care. - Advised to use the albuterol every 4-6 hours for the next 2 days. If no improvement consider a dose of steroids.

## 2017-04-20 NOTE — Progress Notes (Signed)
Terri MaduroSusan Fletcher - 56 y.o. female MRN [id number]  Date of birth: 05/01/1961  SUBJECTIVE:  Including CC & ROS.  Chief Complaint  Patient presents with  . Sinus pressure    Pain and tenderness located in her maxillary area has been present since 04/18/17-she states it started with a dry cough. She did try take some dayquil-with some improvement.     Terri Fletcher is a 56 y.o. female that is Presenting with sinus congestion. The symptoms have been present for 2 days. She has not tried any over-the-counter medications. She has a history of asthma and reports some wheezing. She has coughing that is worse at night. She has not been using her albuterol inhaler on a regular basis. She denies any production with a cough. Is not had any fevers. Has been around someone with similar symptoms..  Review of the chest x-ray from 2016 shows mild bilateral pulmonary interstitial prominence consistent with mild pneumonitis   Review of Systems  Constitutional: Negative for fever.  HENT: Positive for sinus pain and sinus pressure.   Respiratory: Negative for cough.     HISTORY: Past Medical, Surgical, Social, and Family History Reviewed & Updated per EMR.   Pertinent Historical Findings include:  Past Medical History:  Diagnosis Date  . ALLERGIC RHINITIS   . ANXIETY, SITUATIONAL   . ASTHMA   . COMMON MIGRAINE   . DIARRHEA, RECURRENT   . Impaired glucose tolerance 09/25/2012  . INSOMNIA, PERSISTENT     Past Surgical History:  Procedure Laterality Date  . BREAST SURGERY  11/05/2012   excision left breast mass  . CESAREAN SECTION  06/25/1982  . CESAREAN SECTION  11/25/1978    Allergies  Allergen Reactions  . Haldol [Haloperidol Lactate] Other (See Comments)    Lock Jaw  . Cefuroxime Axetil     REACTION: nausea    Family History  Problem Relation Age of Onset  . Cancer Mother        Breast  . Cancer Maternal Grandmother   . Heart attack Maternal Grandfather      Social History   Social  History  . Marital status: Married    Spouse name: N/A  . Number of children: N/A  . Years of education: N/A   Occupational History  . Not on file.   Social History Main Topics  . Smoking status: Former Smoker    Types: Cigarettes  . Smokeless tobacco: Never Used  . Alcohol use 0.0 oz/week     Comment: Occasional.  . Drug use: No     Comment: Occassional.  . Sexual activity: Not on file   Other Topics Concern  . Not on file   Social History Narrative   Regular exercise     PHYSICAL EXAM:  VS: BP 132/76 (BP Location: Left Arm, Patient Position: Sitting, Cuff Size: Normal)   Pulse 71   Temp 98.4 F (36.9 C) (Oral)   Ht 6' (1.829 m)   Wt 204 lb (92.5 kg)   SpO2 99%   BMI 27.67 kg/m  Physical Exam Gen: NAD, alert, cooperative with exam,  ENT: normal lips, normal nasal mucosa,  Tympanic membranes are intact bilaterally, normal oropharynx, no cervical lymphadenopathy Eye: normal EOM, normal conjunctiva and lids CV:  no edema, +2 pedal pulses, S1-S2, regular rate and rhythm   Resp: no accessory muscle use, non-labored, very mild expiratory wheezing, no crackles Skin: no rashes, no areas of induration  Neuro: normal tone, normal sensation to touch Psych:  normal insight, alert  and oriented MSK: normal gait, normal strength       ASSESSMENT & PLAN:   Upper respiratory tract infection Symptoms are viral in nature. - Counseled on supportive care. - Advised to use the albuterol every 4-6 hours for the next 2 days. If no improvement consider a dose of steroids.

## 2017-04-20 NOTE — Telephone Encounter (Signed)
Done hardcopy to Shirron  

## 2017-04-20 NOTE — Patient Instructions (Signed)
Thank you for coming in,   Please take the abuterol inhaler every 4-6 hours for the next 48 hours. Please call us back if you don't have any improvement.   Please try things such as zyrtec-D or allegra-D which is an antihistamine and decongestant.   Please try afrin which will help with nasal congestion but use for only three days.   Please also try using a netti pot on a regular occasion.  Honey can help with a sore throat.      Please feel free to call with any questions or concerns at any time, at [phone]. --Dr. Jordan LikesSchmitz

## 2017-05-02 ENCOUNTER — Encounter: Payer: Self-pay | Admitting: Internal Medicine

## 2017-05-03 ENCOUNTER — Telehealth: Payer: Self-pay

## 2017-05-03 NOTE — Telephone Encounter (Signed)
Approved 04/03/17 through 05/03/18  Determination sent to scan

## 2017-05-09 ENCOUNTER — Encounter: Payer: Self-pay | Admitting: Internal Medicine

## 2017-05-09 ENCOUNTER — Other Ambulatory Visit: Payer: Self-pay | Admitting: Internal Medicine

## 2017-05-09 MED ORDER — ALPRAZOLAM 0.5 MG PO TABS: 0.5000 mg | ORAL_TABLET | Freq: Two times a day (BID) | ORAL | 1 refills | Status: DC | PRN

## 2017-08-11 ENCOUNTER — Encounter: Payer: Self-pay | Admitting: Internal Medicine

## 2017-08-11 MED ORDER — OSELTAMIVIR PHOSPHATE 75 MG PO CAPS: 75.0000 mg | ORAL_CAPSULE | Freq: Two times a day (BID) | ORAL | 0 refills | Status: DC

## 2017-08-11 NOTE — Telephone Encounter (Signed)
Done erx 

## 2017-09-14 ENCOUNTER — Encounter: Payer: Self-pay | Admitting: Internal Medicine

## 2017-09-14 ENCOUNTER — Ambulatory Visit: Payer: Managed Care, Other (non HMO) | Admitting: Internal Medicine

## 2017-09-14 ENCOUNTER — Other Ambulatory Visit (INDEPENDENT_AMBULATORY_CARE_PROVIDER_SITE_OTHER): Payer: Managed Care, Other (non HMO)

## 2017-09-14 ENCOUNTER — Ambulatory Visit (INDEPENDENT_AMBULATORY_CARE_PROVIDER_SITE_OTHER)
Admission: RE | Admit: 2017-09-14 | Discharge: 2017-09-14 | Disposition: A | Discharge status: Home / Self Care | Payer: Managed Care, Other (non HMO) | Source: Ambulatory Visit | Attending: Internal Medicine | Admitting: Internal Medicine

## 2017-09-14 VITALS — BP 128/86 | HR 76 | Temp 98.0°F | Ht 72.0 in | Wt 200.0 lb

## 2017-09-14 DIAGNOSIS — R252 Cramp and spasm: Secondary | ICD-10-CM | POA: Diagnosis not present

## 2017-09-14 DIAGNOSIS — R61 Generalized hyperhidrosis: Secondary | ICD-10-CM

## 2017-09-14 DIAGNOSIS — Z114 Encounter for screening for human immunodeficiency virus [HIV]: Secondary | ICD-10-CM | POA: Diagnosis not present

## 2017-09-14 DIAGNOSIS — M67432 Ganglion, left wrist: Secondary | ICD-10-CM | POA: Diagnosis not present

## 2017-09-14 DIAGNOSIS — G4700 Insomnia, unspecified: Secondary | ICD-10-CM

## 2017-09-14 DIAGNOSIS — Z1159 Encounter for screening for other viral diseases: Secondary | ICD-10-CM

## 2017-09-14 DIAGNOSIS — R634 Abnormal weight loss: Secondary | ICD-10-CM

## 2017-09-14 DIAGNOSIS — Z0001 Encounter for general adult medical examination with abnormal findings: Secondary | ICD-10-CM

## 2017-09-14 DIAGNOSIS — F411 Generalized anxiety disorder: Secondary | ICD-10-CM

## 2017-09-14 LAB — CBC WITH DIFFERENTIAL/PLATELET
BASOS ABS: 0 10*3/uL (ref 0.0–0.1)
Basophils Relative: 0.6 % (ref 0.0–3.0)
EOS ABS: 0 10*3/uL (ref 0.0–0.7)
Eosinophils Relative: 0.8 % (ref 0.0–5.0)
HEMATOCRIT: 41.8 % (ref 36.0–46.0)
Hemoglobin: 13.8 g/dL (ref 12.0–15.0)
LYMPHS PCT: 46.5 % — AB (ref 12.0–46.0)
Lymphs Abs: 2.7 10*3/uL (ref 0.7–4.0)
MCHC: 33 g/dL (ref 30.0–36.0)
MCV: 92.7 fl (ref 78.0–100.0)
MONOS PCT: 5.1 % (ref 3.0–12.0)
Monocytes Absolute: 0.3 10*3/uL (ref 0.1–1.0)
NEUTROS ABS: 2.7 10*3/uL (ref 1.4–7.7)
NEUTROS PCT: 47 % (ref 43.0–77.0)
PLATELETS: 218 10*3/uL (ref 150.0–400.0)
RBC: 4.5 Mil/uL (ref 3.87–5.11)
RDW: 13.2 % (ref 11.5–15.5)
WBC: 5.8 10*3/uL (ref 4.0–10.5)

## 2017-09-14 LAB — BASIC METABOLIC PANEL
BUN: 15 mg/dL (ref 6–23)
CO2: 29 mEq/L (ref 19–32)
Calcium: 9.4 mg/dL (ref 8.4–10.5)
Chloride: 106 mEq/L (ref 96–112)
Creatinine, Ser: 0.66 mg/dL (ref 0.40–1.20)
GFR: 97.78 mL/min (ref >60.00)
GLUCOSE: 98 mg/dL (ref 70–99)
POTASSIUM: 3.8 meq/L (ref 3.5–5.1)
SODIUM: 143 meq/L (ref 135–145)

## 2017-09-14 LAB — LIPID PANEL
CHOLESTEROL: 154 mg/dL (ref 0–200)
HDL: 57.6 mg/dL (ref >39.00)
LDL Cholesterol: 80 mg/dL (ref 0–99)
NonHDL: 96.24
TRIGLYCERIDES: 83 mg/dL (ref 0.0–149.0)
Total CHOL/HDL Ratio: 3
VLDL: 16.6 mg/dL (ref 0.0–40.0)

## 2017-09-14 LAB — HEPATIC FUNCTION PANEL
ALT: 20 U/L (ref 0–35)
AST: 14 U/L (ref 0–37)
Albumin: 4.2 g/dL (ref 3.5–5.2)
Alkaline Phosphatase: 66 U/L (ref 39–117)
Bilirubin, Direct: 0.1 mg/dL (ref 0.0–0.3)
TOTAL PROTEIN: 6.7 g/dL (ref 6.0–8.3)
Total Bilirubin: 0.3 mg/dL (ref 0.2–1.2)

## 2017-09-14 LAB — TSH: TSH: 0.94 u[IU]/mL (ref 0.35–4.50)

## 2017-09-14 LAB — MAGNESIUM: MAGNESIUM: 2 mg/dL (ref 1.5–2.5)

## 2017-09-14 LAB — T4, FREE: FREE T4: 0.57 ng/dL — AB (ref 0.60–1.60)

## 2017-09-14 MED ORDER — CITALOPRAM HYDROBROMIDE 10 MG PO TABS: 10.0000 mg | ORAL_TABLET | Freq: Every day | ORAL | 3 refills | Status: DC

## 2017-09-14 MED ORDER — ALBUTEROL SULFATE HFA 108 (90 BASE) MCG/ACT IN AERS
INHALATION_SPRAY | RESPIRATORY_TRACT | 11 refills | Status: DC
Start: 2017-09-14 — End: 2021-04-22

## 2017-09-14 NOTE — Patient Instructions (Addendum)
Please check if your insurance will pay for the cologuard  Please take all new medication as prescribed - the celexa 10 mg per day  Please continue all other medications as before, and refills have been done if requested.  Please have the pharmacy call with any other refills you may need.  Please continue your efforts at being more active, low cholesterol diet, and weight control.  You are otherwise up to date with prevention measures today.  Please keep your appointments with your specialists as you may have planned  Please go to the XRAY Department in the Basement (go straight as you get off the elevator) for the x-ray testing  Please go to the LAB in the Basement (turn left off the elevator) for the tests to be done today  You will be contacted by phone if any changes need to be made immediately.  Otherwise, you will receive a letter about your results with an explanation, but please check with MyChart first.  Please remember to sign up for MyChart if you have not done so, as this will be important to you in the future with finding out test results, communicating by private email, and scheduling acute appointments online when needed.  Please return in 6 months, or sooner if needed

## 2017-09-14 NOTE — Progress Notes (Signed)
Subjective:    Patient ID: Terri Fletcher, female    DOB: 1960-08-04, 57 y.o.   MRN: [id number]  HPI Here for wellness and f/u;  Overall doing ok;  Pt denies Chest pain, worsening SOB, DOE, wheezing, orthopnea, PND, worsening LE edema, palpitations, dizziness or syncope.  Pt denies neurological change such as new headache, facial or extremity weakness.  Pt denies polydipsia, polyuria, or low sugar symptoms. Pt states overall good compliance with treatment and medications, good tolerability, and has been trying to follow appropriate diet.  Pt denies worsening depressive symptoms, suicidal ideation or panic. No fever, night sweats, wt loss, loss of appetite, or other constitutional symptoms.  Pt states good ability with ADL's, has low fall risk, home safety reviewed and adequate, no other significant changes in hearing or vision, and not active with exercise, but plans to be more active in the spring  Declines immmunizations. Will check with insurance about cologaurd.   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 related to this but pt not sure, also BP at home usually 130's/high 80s.  Concerned she has sweaty hands and feet, as well as recently worsening feet cramping despite increased fluids.  Admits to checking Dr Waverly Ferrari about the cramps.  Also having some night sweats so that she gets soaked and changes the bedclothes, despite using the fan.  Still working from home on computer, stays busy all the time. Also has a small knot near the post left hand at the wrist, tender sometimes, gets bigger and smaller.  No other interval hx or new compaint Past Medical History:  Diagnosis Date  . ALLERGIC RHINITIS   . ANXIETY,  SITUATIONAL   . ASTHMA   . COMMON MIGRAINE   . DIARRHEA, RECURRENT   . Impaired glucose tolerance 09/25/2012  . INSOMNIA, PERSISTENT    Past Surgical History:  Procedure Laterality Date  . BREAST SURGERY  11/05/2012   excision left breast mass  . CESAREAN SECTION  06/25/1982  . CESAREAN SECTION  11/25/1978    reports that she has quit smoking. Her smoking use included cigarettes. She has never used smokeless tobacco. She reports that she drinks alcohol. She reports that she does not use drugs. family history includes Cancer in her maternal grandmother and mother; Heart attack in her maternal grandfather. Allergies  Allergen Reactions  . Haldol [Haloperidol Lactate] Other (See Comments)    Lock Jaw  . Cefuroxime Axetil     REACTION: nausea   Current Outpatient Medications on File Prior to Visit  Medication Sig Dispense Refill  . acetaminophen (TYLENOL) 325 MG tablet Take 650 mg by mouth every 6 (six) hours as needed.    . ALPRAZolam (XANAX) 0.5 MG tablet Take 1 tablet (0.5 mg total) by mouth 2 (two) times daily as needed. 180 tablet 1  . gabapentin (NEURONTIN) 300 MG capsule 1 tab by mouth three times daily 90 capsule 5  . zolpidem (AMBIEN) 10 MG tablet Take 1 tablet (10 mg total) by mouth at bedtime as needed for sleep. 90 tablet 1   No current facility-administered medications on file prior to visit.    Review of Systems Constitutional: Negative for other unusual diaphoresis, sweats, appetite  or weight changes HENT: Negative for other worsening hearing loss, ear pain, facial swelling, mouth sores or neck stiffness.   Eyes: Negative for other worsening pain, redness or other visual disturbance.  Respiratory: Negative for other stridor or swelling Cardiovascular: Negative for other palpitations or other chest pain  Gastrointestinal: Negative for worsening diarrhea or loose stools, blood in stool, distention or other pain Genitourinary: Negative for hematuria, flank pain or other  change in urine volume.  Musculoskeletal: Negative for myalgias or other joint swelling.  Skin: Negative for other color change, or other wound or worsening drainage.  Neurological: Negative for other syncope or numbness. Hematological: Negative for other adenopathy or swelling Psychiatric/Behavioral: Negative for hallucinations, other worsening agitation, SI, self-injury, or new decreased concentration All other system neg per pt    Objective:   Physical Exam BP 128/86   Pulse 76   Temp 98 F (36.7 C) (Oral)   Ht 6' (1.829 m)   Wt 200 lb (90.7 kg)   SpO2 99%   BMI 27.12 kg/m  VS noted,  Constitutional: Pt is oriented to person, place, and time. Appears well-developed and well-nourished, in no significant distress and comfortable Head: Normocephalic and atraumatic  Eyes: Conjunctivae and EOM are normal. Pupils are equal, round, and reactive to light Right Ear: External ear normal without discharge Left Ear: External ear normal without discharge Nose: Nose without discharge or deformity Mouth/Throat: Oropharynx is without other ulcerations and moist  Neck: Normal range of motion. Neck supple. No JVD present. No tracheal deviation present or significant neck LA or mass Cardiovascular: Normal rate, regular rhythm, normal heart sounds and intact distal pulses.   Pulmonary/Chest: WOB normal and breath sounds without rales or wheezing  Abdominal: Soft. Bowel sounds are normal. NT. No HSM  Musculoskeletal: Normal range of motion. Exhibits no edema; left post hand near wrist with 3 compartment small knot on tender, mobile with tendon movement, firm, mild tender, no overlying skin change Lymphadenopathy: Has no other cervical adenopathy.  Neurological: Pt is alert and oriented to person, place, and time. Pt has normal reflexes. No cranial nerve deficit. Motor grossly intact, Gait intact Skin: Skin is warm and dry. No rash noted or new ulcerations Psychiatric:  Has nervous mood and affect.  Behavior is normal without agitation No other exam findings Lab Results  Component Value Date   WBC 5.8 09/14/2017   HGB 13.8 09/14/2017   HCT 41.8 09/14/2017   PLT 218.0 09/14/2017   GLUCOSE 98 09/14/2017   CHOL 154 09/14/2017   TRIG 83.0 09/14/2017   HDL 57.60 09/14/2017   LDLCALC 80 09/14/2017   ALT 20 09/14/2017   AST 14 09/14/2017   NA 143 09/14/2017   K 3.8 09/14/2017   CL 106 09/14/2017   CREATININE 0.66 09/14/2017   BUN 15 09/14/2017   CO2 29 09/14/2017   TSH 0.94 09/14/2017   HGBA1C 5.9 09/26/2012       Assessment & Plan:

## 2017-09-15 LAB — HIV ANTIBODY (ROUTINE TESTING W REFLEX): HIV: NONREACTIVE

## 2017-09-15 LAB — HEPATITIS C ANTIBODY
HEP C AB: NONREACTIVE
SIGNAL TO CUT-OFF: 0.03 (ref <1.00)

## 2017-09-17 NOTE — Assessment & Plan Note (Signed)

## 2017-09-17 NOTE — Assessment & Plan Note (Signed)
Ok for celexa 10 qd,  to f/u any worsening symptoms or concerns 

## 2017-09-17 NOTE — Assessment & Plan Note (Signed)
Also to check mg with labs, declines muscle relaxer

## 2017-09-17 NOTE — Assessment & Plan Note (Signed)
Etiology unclear, for cxr as well

## 2017-09-17 NOTE — Assessment & Plan Note (Addendum)
Small, mild, d/w pt, declines hand surgury referral  In addition to the time spent performing CPE, I spent an additional 25 minutes face to face,in which greater than 50% of this time was spent in counseling and coordination of care for patient's acute illness as documented, including the differential dx, treatment, further evaluation and other management of left wrist ganglion cyst, leg cramping, night sweats, wt loss, anxiety and insomnia

## 2017-09-17 NOTE — Assessment & Plan Note (Signed)
Chronic persistent, for med refill,  to f/u any worsening symptoms or concerns 

## 2017-09-17 NOTE — Assessment & Plan Note (Signed)
More or less unintentional, for cxr as above, to cont diet and wt control

## 2017-10-31 ENCOUNTER — Encounter: Payer: Self-pay | Admitting: Internal Medicine

## 2017-10-31 MED ORDER — ZOLPIDEM TARTRATE 10 MG PO TABS: 10.0000 mg | ORAL_TABLET | Freq: Every evening | ORAL | 1 refills | Status: DC | PRN

## 2017-10-31 NOTE — Telephone Encounter (Signed)
ambien done erx 

## 2017-11-15 ENCOUNTER — Ambulatory Visit (INDEPENDENT_AMBULATORY_CARE_PROVIDER_SITE_OTHER): Payer: Managed Care, Other (non HMO) | Admitting: Orthopaedic Surgery

## 2017-11-15 ENCOUNTER — Encounter (INDEPENDENT_AMBULATORY_CARE_PROVIDER_SITE_OTHER): Payer: Self-pay | Admitting: Orthopaedic Surgery

## 2017-11-15 ENCOUNTER — Ambulatory Visit (INDEPENDENT_AMBULATORY_CARE_PROVIDER_SITE_OTHER): Payer: Managed Care, Other (non HMO)

## 2017-11-15 DIAGNOSIS — M25532 Pain in left wrist: Secondary | ICD-10-CM | POA: Insufficient documentation

## 2017-11-15 MED ORDER — PREDNISONE 10 MG (21) PO TBPK: ORAL_TABLET | ORAL | 0 refills | Status: DC

## 2017-11-15 NOTE — Progress Notes (Signed)
Office Visit Note   Patient: Terri Fletcher           Date of Birth: April 26, 1961           MRN: [id number] Visit Date: 11/15/2017              Requested by: Corwin Levins, MD [address] [place], [place] [address] PCP: Corwin Levins, MD   Assessment & Plan: Visit Diagnoses:  1. Pain in left wrist     Plan: Impression is cervical spine radiculopathy versus cubital tunnel syndrome.  At this point, I have discussed with the patient proceeding with a nerve conduction study.  She has a phobia of needles and does not want to proceed with this.  We will try a steroid taper to see if she gets any relief.  If she does not have any relief after 1 to 2 weeks, she will call and we will order an MRI of her cervical spine.  Call with concerns or questions in the meantime  Follow-Up Instructions: Return if symptoms worsen or fail to improve.   Orders:  Orders Placed This Encounter  Procedures  . XR Wrist Complete Left  . XR Cervical Spine 2 or 3 views   Meds ordered this encounter  Medications  . predniSONE (STERAPRED UNI-PAK 21 TAB) 10 MG (21) TBPK tablet    Sig: Take as directed    Dispense:  21 tablet    Refill:  0      Procedures: No procedures performed   Clinical Data: No additional findings.   Subjective: Chief Complaint  Patient presents with  . Left Hand - Pain    HPI patient is a pleasant 57 year old right-hand-dominant female who presents to our clinic today with decreased sensation to the ring and small fingers of the left hand.  She noticed this about 1 month ago and it has progressively worsened.  No known injury however she does type all day for work.  The decreased sensation she has is constant throughout the day.  She describes this as a pins and needle sensation.  This is starting to affect her ability to type.  She also noticed a small cyst to the dorsum of her left wrist about 1 month ago.  Unsure if this is related.  No history of cervical spine  pathology.  Review of Systems as detailed in HPI.  All others reviewed and are negative.   Objective: Vital Signs: There were no vitals taken for this visit.  Physical Exam well-developed well-nourished female no acute distress.  Alert and oriented x3.  Ortho Exam examination of her left wrist reveals a small pea-sized cyst to the dorsum of her wrist.  This is nontender.  Full range of motion of the wrist.  She does have decreased sensation to the ring and small fingers.  Negative Tinel at the elbow.  Full and painless range of motion of the neck.  Specialty Comments:  No specialty comments available.  Imaging: Xr Wrist Complete Left  Result Date: 11/15/2017 X-rays of the wrist are unremarkable  Xr Cervical Spine 2 Or 3 Views  Result Date: 11/15/2017 Marked degenerative changes at C6-7 with significant anterior spurring    PMFS History: Patient Active Problem List   Diagnosis Date Noted  . Pain in left wrist 11/15/2017  . Ganglion of left wrist 09/14/2017  . Leg cramping 09/14/2017  . Night sweats 09/14/2017  . Weight loss 09/14/2017  . Upper respiratory tract infection 04/20/2017  .  Left groin pain 10/19/2016  . Abdominal pain, epigastric 03/19/2013  . Left lumbar radiculopathy 02/07/2013  . Elevated blood pressure reading without diagnosis of hypertension 02/07/2013  . Left breast mass 12/06/2012  . Hematochezia 10/09/2012  . LLQ pain 09/25/2012  . Frequency of urination 09/05/2012  . Encounter for well adult exam with abnormal findings 05/30/2011  . Anxiety state 03/01/2010  . COMMON MIGRAINE 03/01/2010  . DIARRHEA, RECURRENT 03/01/2010  . INSOMNIA, PERSISTENT 01/04/2008  . Allergic rhinitis 05/21/2007  . Asthma 03/04/2007   Past Medical History:  Diagnosis Date  . ALLERGIC RHINITIS   . ANXIETY, SITUATIONAL   . ASTHMA   . COMMON MIGRAINE   . DIARRHEA, RECURRENT   . Impaired glucose tolerance 09/25/2012  . INSOMNIA, PERSISTENT     Family History  Problem  Relation Age of Onset  . Cancer Mother        Breast  . Cancer Maternal Grandmother   . Heart attack Maternal Grandfather     Past Surgical History:  Procedure Laterality Date  . BREAST SURGERY  11/05/2012   excision left breast mass  . CESAREAN SECTION  06/25/1982  . CESAREAN SECTION  11/25/1978   Social History   Occupational History  . Not on file  Tobacco Use  . Smoking status: Former Smoker    Types: Cigarettes  . Smokeless tobacco: Never Used  Substance and Sexual Activity  . Alcohol use: Yes    Alcohol/week: 0.0 oz    Comment: Occasional.  . Drug use: No    Types: Marijuana    Comment: Occassional.  . Sexual activity: Not on file

## 2017-12-04 ENCOUNTER — Encounter: Payer: Self-pay | Admitting: Internal Medicine

## 2017-12-18 ENCOUNTER — Encounter: Payer: Self-pay | Admitting: Internal Medicine

## 2017-12-18 MED ORDER — CLONAZEPAM 0.5 MG PO TABS: 0.5000 mg | ORAL_TABLET | Freq: Two times a day (BID) | ORAL | 2 refills | Status: DC | PRN

## 2017-12-18 NOTE — Telephone Encounter (Signed)
Done erx 

## 2018-01-12 ENCOUNTER — Other Ambulatory Visit: Payer: Self-pay | Admitting: Internal Medicine

## 2018-04-26 ENCOUNTER — Encounter: Payer: Self-pay | Admitting: Internal Medicine

## 2018-04-26 MED ORDER — ZOLPIDEM TARTRATE 10 MG PO TABS: 10.0000 mg | ORAL_TABLET | Freq: Every evening | ORAL | 1 refills | Status: DC | PRN

## 2018-04-26 NOTE — Telephone Encounter (Signed)
ambien done erx  shirron to address PA per pt request please

## 2018-06-18 ENCOUNTER — Encounter: Payer: Self-pay | Admitting: Internal Medicine

## 2018-06-18 MED ORDER — DIAZEPAM 5 MG PO TABS: ORAL_TABLET | ORAL | 2 refills | Status: DC

## 2018-06-18 NOTE — Telephone Encounter (Signed)
Done erx 

## 2018-07-12 ENCOUNTER — Encounter: Payer: Self-pay | Admitting: Internal Medicine

## 2018-08-22 ENCOUNTER — Ambulatory Visit (INDEPENDENT_AMBULATORY_CARE_PROVIDER_SITE_OTHER): Payer: BLUE CROSS/BLUE SHIELD | Admitting: Internal Medicine

## 2018-08-22 ENCOUNTER — Other Ambulatory Visit (INDEPENDENT_AMBULATORY_CARE_PROVIDER_SITE_OTHER): Payer: BLUE CROSS/BLUE SHIELD

## 2018-08-22 ENCOUNTER — Encounter: Payer: Self-pay | Admitting: Internal Medicine

## 2018-08-22 VITALS — BP 122/80 | HR 73 | Temp 98.4°F | Ht 72.0 in | Wt 194.0 lb

## 2018-08-22 DIAGNOSIS — Z0000 Encounter for general adult medical examination without abnormal findings: Secondary | ICD-10-CM | POA: Diagnosis not present

## 2018-08-22 DIAGNOSIS — R739 Hyperglycemia, unspecified: Secondary | ICD-10-CM

## 2018-08-22 LAB — CBC WITH DIFFERENTIAL/PLATELET
BASOS ABS: 0 10*3/uL (ref 0.0–0.1)
Basophils Relative: 0.4 % (ref 0.0–3.0)
Eosinophils Absolute: 0 10*3/uL (ref 0.0–0.7)
Eosinophils Relative: 0.5 % (ref 0.0–5.0)
HEMATOCRIT: 43.5 % (ref 36.0–46.0)
Hemoglobin: 14.7 g/dL (ref 12.0–15.0)
LYMPHS PCT: 47.6 % — AB (ref 12.0–46.0)
Lymphs Abs: 3.1 10*3/uL (ref 0.7–4.0)
MCHC: 33.9 g/dL (ref 30.0–36.0)
MCV: 91.5 fl (ref 78.0–100.0)
Monocytes Absolute: 0.4 10*3/uL (ref 0.1–1.0)
Monocytes Relative: 5.7 % (ref 3.0–12.0)
Neutro Abs: 3 10*3/uL (ref 1.4–7.7)
Neutrophils Relative %: 45.8 % (ref 43.0–77.0)
Platelets: 225 10*3/uL (ref 150.0–400.0)
RBC: 4.75 Mil/uL (ref 3.87–5.11)
RDW: 13 % (ref 11.5–15.5)
WBC: 6.6 10*3/uL (ref 4.0–10.5)

## 2018-08-22 LAB — BASIC METABOLIC PANEL
BUN: 12 mg/dL (ref 6–23)
CALCIUM: 10.6 mg/dL — AB (ref 8.4–10.5)
CO2: 29 mEq/L (ref 19–32)
Chloride: 104 mEq/L (ref 96–112)
Creatinine, Ser: 0.78 mg/dL (ref 0.40–1.20)
GFR: 75.62 mL/min (ref >60.00)
Glucose, Bld: 97 mg/dL (ref 70–99)
Potassium: 3.4 mEq/L — ABNORMAL LOW (ref 3.5–5.1)
Sodium: 141 mEq/L (ref 135–145)

## 2018-08-22 LAB — LIPID PANEL
Cholesterol: 180 mg/dL (ref 0–200)
HDL: 60.6 mg/dL (ref >39.00)
LDL Cholesterol: 93 mg/dL (ref 0–99)
NONHDL: 119.22
Total CHOL/HDL Ratio: 3
Triglycerides: 129 mg/dL (ref 0.0–149.0)
VLDL: 25.8 mg/dL (ref 0.0–40.0)

## 2018-08-22 LAB — URINALYSIS, ROUTINE W REFLEX MICROSCOPIC
Bilirubin Urine: NEGATIVE
Ketones, ur: NEGATIVE
Leukocytes,Ua: NEGATIVE
Nitrite: NEGATIVE
RBC / HPF: NONE SEEN (ref 0–?)
Specific Gravity, Urine: >=1.03 — AB (ref 1.000–1.030)
Total Protein, Urine: NEGATIVE
Urine Glucose: NEGATIVE
Urobilinogen, UA: 0.2 (ref 0.0–1.0)
pH: 6 (ref 5.0–8.0)

## 2018-08-22 LAB — HEMOGLOBIN A1C: HEMOGLOBIN A1C: 5.7 % (ref 4.6–6.5)

## 2018-08-22 LAB — HEPATIC FUNCTION PANEL
ALT: 15 U/L (ref 0–35)
AST: 12 U/L (ref 0–37)
Albumin: 4.9 g/dL (ref 3.5–5.2)
Alkaline Phosphatase: 81 U/L (ref 39–117)
Bilirubin, Direct: 0.1 mg/dL (ref 0.0–0.3)
Total Bilirubin: 0.6 mg/dL (ref 0.2–1.2)
Total Protein: 7.4 g/dL (ref 6.0–8.3)

## 2018-08-22 LAB — TSH: TSH: 1.33 u[IU]/mL (ref 0.35–4.50)

## 2018-08-22 NOTE — Patient Instructions (Signed)
You will be signed up for the Cologuard  Please continue all other medications as before, and refills have been done if requested.  Please have the pharmacy call with any other refills you may need.  Please continue your efforts at being more active, low cholesterol diet, and weight control.  You are otherwise up to date with prevention measures today.  Please keep your appointments with your specialists as you may have planned  Please go to the LAB in the Basement (turn left off the elevator) for the tests to be done today  You will be contacted by phone if any changes need to be made immediately.  Otherwise, you will receive a letter about your results with an explanation, but please check with MyChart first.  Please remember to sign up for MyChart if you have not done so, as this will be important to you in the future with finding out test results, communicating by private email, and scheduling acute appointments online when needed.  Please return in 1 year for your yearly visit, or sooner if needed, with Lab testing done 3-5 days before

## 2018-08-22 NOTE — Assessment & Plan Note (Signed)

## 2018-08-22 NOTE — Progress Notes (Signed)
Subjective:    Patient ID: Terri Fletcher, female    DOB: 02/05/1961, 58 y.o.   MRN: [id number]  HPI  Here for wellness and f/u;  Overall doing ok;  Pt denies Chest pain, worsening SOB, DOE, wheezing, orthopnea, PND, worsening LE edema, palpitations, dizziness or syncope.  Pt denies neurological change such as new headache, facial or extremity weakness.  Pt denies polydipsia, polyuria, or low sugar symptoms. Pt states overall good compliance with treatment and medications, good tolerability, and has been trying to follow appropriate diet.  Pt denies worsening depressive symptoms, suicidal ideation or panic. No fever, night sweats, wt loss, loss of appetite, or other constitutional symptoms.  Pt states good ability with ADL's, has low fall risk, home safety reviewed and adequate, no other significant changes in hearing or vision, and only occasionally active with exercise. Hoping to retire soon, very stressful as Primary school teacher.   Past Medical History:  Diagnosis Date  . ALLERGIC RHINITIS   . ANXIETY, SITUATIONAL   . ASTHMA   . COMMON MIGRAINE   . DIARRHEA, RECURRENT   . Impaired glucose tolerance 09/25/2012  . INSOMNIA, PERSISTENT    Past Surgical History:  Procedure Laterality Date  . BREAST SURGERY  11/05/2012   excision left breast mass  . CESAREAN SECTION  06/25/1982  . CESAREAN SECTION  11/25/1978    reports that she has quit smoking. Her smoking use included cigarettes. She has never used smokeless tobacco. She reports current alcohol use. She reports that she does not use drugs. family history includes Cancer in her maternal grandmother and mother; Heart attack in her maternal grandfather. Allergies  Allergen Reactions  . Haldol [Haloperidol Lactate] Other (See Comments)    Lock Jaw  . Cefuroxime Axetil     REACTION: nausea  . Citalopram Other (See Comments)    dizzy   Current Outpatient Medications on File Prior to Visit  Medication Sig Dispense Refill  .  acetaminophen (TYLENOL) 325 MG tablet Take 650 mg by mouth every 6 (six) hours as needed.    Marland Kitchen albuterol (PROAIR HFA) 108 (90 Base) MCG/ACT inhaler INHALE TWO PUFFS BY MOUTH FOUR TIMES DAILY FOR WHEEZING 8.5 each 11  . diazepam (VALIUM) 5 MG tablet 1/2 - 1 tab by mouth daily as needed 30 tablet 2  . gabapentin (NEURONTIN) 300 MG capsule TAKE ONE CAPSULE BY MOUTH 3 TIMES A DAY 90 capsule 2  . predniSONE (STERAPRED UNI-PAK 21 TAB) 10 MG (21) TBPK tablet Take as directed 21 tablet 0  . zolpidem (AMBIEN) 10 MG tablet Take 1 tablet (10 mg total) by mouth at bedtime as needed for sleep. 90 tablet 1   No current facility-administered medications on file prior to visit.    Review of Systems Constitutional: Negative for other unusual diaphoresis, sweats, appetite or weight changes HENT: Negative for other worsening hearing loss, ear pain, facial swelling, mouth sores or neck stiffness.   Eyes: Negative for other worsening pain, redness or other visual disturbance.  Respiratory: Negative for other stridor or swelling Cardiovascular: Negative for other palpitations or other chest pain  Gastrointestinal: Negative for worsening diarrhea or loose stools, blood in stool, distention or other pain Genitourinary: Negative for hematuria, flank pain or other change in urine volume.  Musculoskeletal: Negative for myalgias or other joint swelling.  Skin: Negative for other color change, or other wound or worsening drainage.  Neurological: Negative for other syncope or numbness. Hematological: Negative for other adenopathy or swelling Psychiatric/Behavioral: Negative for hallucinations, other  worsening agitation, SI, self-injury, or new decreased concentration All other system neg per pt    Objective:   Physical Exam BP 122/80   Pulse 73   Temp 98.4 F (36.9 C) (Oral)   Ht 6' (1.829 m)   Wt 194 lb (88 kg)   SpO2 96%   BMI 26.31 kg/m  VS noted,  Constitutional: Pt is oriented to person, place, and time.  Appears well-developed and well-nourished, in no significant distress and comfortable Head: Normocephalic and atraumatic  Eyes: Conjunctivae and EOM are normal. Pupils are equal, round, and reactive to light Right Ear: External ear normal without discharge Left Ear: External ear normal without discharge Nose: Nose without discharge or deformity Mouth/Throat: Oropharynx is without other ulcerations and moist  Neck: Normal range of motion. Neck supple. No JVD present. No tracheal deviation present or significant neck LA or mass Cardiovascular: Normal rate, regular rhythm, normal heart sounds and intact distal pulses.   Pulmonary/Chest: WOB normal and breath sounds without rales or wheezing  Abdominal: Soft. Bowel sounds are normal. NT. No HSM  Musculoskeletal: Normal range of motion. Exhibits no edema Lymphadenopathy: Has no other cervical adenopathy.  Neurological: Pt is alert and oriented to person, place, and time. Pt has normal reflexes. No cranial nerve deficit. Motor grossly intact, Gait intact Skin: Skin is warm and dry. No rash noted or new ulcerations Psychiatric:  Has normal mood and affect. Behavior is normal without agitation No other exam findings Lab Results  Component Value Date   WBC 6.6 08/22/2018   HGB 14.7 08/22/2018   HCT 43.5 08/22/2018   PLT 225.0 08/22/2018   GLUCOSE 97 08/22/2018   CHOL 180 08/22/2018   TRIG 129.0 08/22/2018   HDL 60.60 08/22/2018   LDLCALC 93 08/22/2018   ALT 15 08/22/2018   AST 12 08/22/2018   NA 141 08/22/2018   K 3.4 (L) 08/22/2018   CL 104 08/22/2018   CREATININE 0.78 08/22/2018   BUN 12 08/22/2018   CO2 29 08/22/2018   TSH 1.33 08/22/2018   HGBA1C 5.7 08/22/2018       Assessment & Plan:

## 2018-08-22 NOTE — Assessment & Plan Note (Signed)
Mild to mod, for antibx course,  to f/u any worsening symptoms or concerns for f/u a1c

## 2018-09-03 DIAGNOSIS — Z1211 Encounter for screening for malignant neoplasm of colon: Secondary | ICD-10-CM | POA: Diagnosis not present

## 2018-09-03 LAB — COLOGUARD: Cologuard: NEGATIVE

## 2018-09-20 ENCOUNTER — Encounter: Payer: Self-pay | Admitting: Internal Medicine

## 2018-09-20 MED ORDER — AMOXICILLIN 500 MG PO CAPS: 500.0000 mg | ORAL_CAPSULE | Freq: Two times a day (BID) | ORAL | 0 refills | Status: DC

## 2018-10-15 ENCOUNTER — Encounter: Payer: Self-pay | Admitting: Internal Medicine

## 2018-10-15 MED ORDER — ZOLPIDEM TARTRATE 10 MG PO TABS: 10.0000 mg | ORAL_TABLET | Freq: Every evening | ORAL | 1 refills | Status: DC | PRN

## 2018-10-15 NOTE — Telephone Encounter (Signed)
Done erx 

## 2018-11-02 ENCOUNTER — Encounter: Payer: Self-pay | Admitting: Internal Medicine

## 2018-11-02 MED ORDER — DIAZEPAM 5 MG PO TABS: ORAL_TABLET | ORAL | 2 refills | Status: DC

## 2018-11-02 NOTE — Telephone Encounter (Signed)
Done erx 

## 2019-02-27 ENCOUNTER — Encounter: Payer: Self-pay | Admitting: Internal Medicine

## 2019-02-27 MED ORDER — AMOXICILLIN 500 MG PO CAPS: 500.0000 mg | ORAL_CAPSULE | Freq: Two times a day (BID) | ORAL | 0 refills | Status: DC

## 2019-04-04 ENCOUNTER — Encounter: Payer: Self-pay | Admitting: Internal Medicine

## 2019-04-04 MED ORDER — DIAZEPAM 5 MG PO TABS: ORAL_TABLET | ORAL | 2 refills | Status: DC

## 2019-04-21 ENCOUNTER — Encounter: Payer: Self-pay | Admitting: Internal Medicine

## 2019-04-22 ENCOUNTER — Encounter: Payer: Self-pay | Admitting: Internal Medicine

## 2019-04-22 MED ORDER — ZOLPIDEM TARTRATE 10 MG PO TABS: 10.0000 mg | ORAL_TABLET | Freq: Every evening | ORAL | 0 refills | Status: DC | PRN

## 2019-04-22 MED ORDER — ZOLPIDEM TARTRATE 10 MG PO TABS: 10.0000 mg | ORAL_TABLET | Freq: Every evening | ORAL | 1 refills | Status: DC | PRN

## 2019-07-28 ENCOUNTER — Encounter: Payer: Self-pay | Admitting: Internal Medicine

## 2019-07-29 MED ORDER — ZOLPIDEM TARTRATE 10 MG PO TABS: 10.0000 mg | ORAL_TABLET | Freq: Every evening | ORAL | 1 refills | Status: DC | PRN

## 2019-07-29 NOTE — Telephone Encounter (Signed)
Ok for [facility]ill [facility]

## 2019-08-30 ENCOUNTER — Encounter: Payer: Self-pay | Admitting: Internal Medicine

## 2019-08-30 MED ORDER — DIAZEPAM 5 MG PO TABS: ORAL_TABLET | ORAL | 2 refills | Status: DC

## 2019-08-30 NOTE — Telephone Encounter (Signed)
Valium done erx  Ok to let pt know-   Ok for [employer] (or zyrtec) and Nasacort together for allergies which have started for everyone in the past week  Consider visit if not improved

## 2019-12-19 ENCOUNTER — Other Ambulatory Visit: Payer: Self-pay

## 2019-12-19 ENCOUNTER — Telehealth (INDEPENDENT_AMBULATORY_CARE_PROVIDER_SITE_OTHER): Payer: BC Managed Care – PPO | Admitting: Internal Medicine

## 2019-12-19 DIAGNOSIS — R739 Hyperglycemia, unspecified: Secondary | ICD-10-CM | POA: Diagnosis not present

## 2019-12-19 DIAGNOSIS — J329 Chronic sinusitis, unspecified: Secondary | ICD-10-CM | POA: Diagnosis not present

## 2019-12-19 DIAGNOSIS — J309 Allergic rhinitis, unspecified: Secondary | ICD-10-CM

## 2019-12-19 DIAGNOSIS — J45909 Unspecified asthma, uncomplicated: Secondary | ICD-10-CM

## 2019-12-19 MED ORDER — PREDNISONE 10 MG (21) PO TBPK: ORAL_TABLET | ORAL | 0 refills | Status: DC

## 2019-12-19 MED ORDER — LEVOFLOXACIN 500 MG PO TABS: 500.0000 mg | ORAL_TABLET | Freq: Every day | ORAL | 0 refills | Status: AC

## 2019-12-19 NOTE — Progress Notes (Signed)
Patient ID: Terri Fletcher, female   DOB: 24-Oct-1960, 59 y.o.   MRN: [id number]  Virtual Visit via Video Note  I connected with Terri Fletcher on December 19 2019  at  2:40 PM EDT by a video enabled telemedicine application and verified that I am speaking with the correct person using two identifiers.  Location: of all participants today Patient: at home Provider: at visit   I discussed the limitations of evaluation and management by telemedicine and the availability of in person appointments. The patient expressed understanding and agreed to proceed.  History of Present Illness:  Here with 2-3 days acute onset fever, facial pain, pressure, headache, general weakness and malaise, and greenish d/c, with mild ST and cough, but pt denies chest pain, wheezing, increased sob or doe, orthopnea, PND, increased LE swelling, palpitations, dizziness or syncope.  Terri Kitchenalso, Does have several wks ongoing nasal allergy symptoms with clearish congestion, itch and sneezing, without fever, pain, ST, cough, swelling or wheezing.  Also has right ear popping with chewing, talking.   Pt denies polydipsia, polyuria     Past Medical History:  Diagnosis Date  . ALLERGIC RHINITIS   . ANXIETY, SITUATIONAL   . ASTHMA   . COMMON MIGRAINE   . DIARRHEA, RECURRENT   . Impaired glucose tolerance 09/25/2012  . INSOMNIA, PERSISTENT    Past Surgical History:  Procedure Laterality Date  . BREAST SURGERY  11/05/2012   excision left breast mass  . CESAREAN SECTION  06/25/1982  . CESAREAN SECTION  11/25/1978    reports that she has quit smoking. Her smoking use included cigarettes. She has never used smokeless tobacco. She reports current alcohol use. She reports that she does not use drugs. family history includes Cancer in her maternal grandmother and mother; Heart attack in her maternal grandfather. Allergies  Allergen Reactions  . Haldol [Haloperidol Lactate] Other (See Comments)    Lock Jaw  . Cefuroxime Axetil     REACTION:  nausea  . Citalopram Other (See Comments)    dizzy   Current Outpatient Medications on File Prior to Visit  Medication Sig Dispense Refill  . acetaminophen (TYLENOL) 325 MG tablet Take 650 mg by mouth every 6 (six) hours as needed.    Terri Fletcher albuterol (PROAIR HFA) 108 (90 Base) MCG/ACT inhaler INHALE TWO PUFFS BY MOUTH FOUR TIMES DAILY FOR WHEEZING 8.5 each 11  . amoxicillin (AMOXIL) 500 MG capsule Take 1 capsule (500 mg total) by mouth 2 (two) times daily. 40 capsule 0  . diazepam (VALIUM) 5 MG tablet 1/2 - 1 tab by mouth daily as needed 30 tablet 2  . gabapentin (NEURONTIN) 300 MG capsule TAKE ONE CAPSULE BY MOUTH 3 TIMES A DAY 90 capsule 2  . zolpidem (AMBIEN) 10 MG tablet Take 1 tablet (10 mg total) by mouth at bedtime as needed for sleep. 90 tablet 1   No current facility-administered medications on file prior to visit.    Observations/Objective: Alert, NAD, appropriate mood and affect, resps normal, cn 2-12 intact, moves all 4s, no visible rash or swelling Lab Results  Component Value Date   WBC 6.6 08/22/2018   HGB 14.7 08/22/2018   HCT 43.5 08/22/2018   PLT 225.0 08/22/2018   GLUCOSE 97 08/22/2018   CHOL 180 08/22/2018   TRIG 129.0 08/22/2018   HDL 60.60 08/22/2018   LDLCALC 93 08/22/2018   ALT 15 08/22/2018   AST 12 08/22/2018   NA 141 08/22/2018   K 3.4 (L) 08/22/2018   CL 104 08/22/2018  CREATININE 0.78 08/22/2018   BUN 12 08/22/2018   CO2 29 08/22/2018   TSH 1.33 08/22/2018   HGBA1C 5.7 08/22/2018   Assessment and Plan: See notes  Follow Up Instructions: Se notes   I discussed the assessment and treatment plan with the patient. The patient was provided an opportunity to ask questions and all were answered. The patient agreed with the plan and demonstrated an understanding of the instructions.   The patient was advised to call back or seek an in-person evaluation if the symptoms worsen or if the condition fails to improve as anticipated.  Terri Barre, MD

## 2019-12-22 ENCOUNTER — Encounter: Payer: Self-pay | Admitting: Internal Medicine

## 2019-12-22 DIAGNOSIS — J329 Chronic sinusitis, unspecified: Secondary | ICD-10-CM | POA: Insufficient documentation

## 2019-12-22 NOTE — Assessment & Plan Note (Signed)
stable overall by history and exam, recent data reviewed with pt, and pt to continue medical treatment as before,  to f/u any worsening symptoms or concerns  

## 2019-12-22 NOTE — Patient Instructions (Signed)
Please take all new medication as prescribed 

## 2019-12-22 NOTE — Assessment & Plan Note (Signed)
Mild to mod, for predpac asd, and restart home allegra, nasacort an dmucinex, to f/u any worsening symptoms or concerns

## 2019-12-22 NOTE — Assessment & Plan Note (Addendum)
Mild to mod, for antibx course,  to f/u any worsening symptoms or concerns  I spent 31 minutes in preparing to see the patient by review of recent labs, imaging and procedures, obtaining and reviewing separately obtained history, communicating with the patient and family or caregiver, ordering medications, tests or procedures, and documenting clinical information in the EHR including the differential Dx, treatment, and any further evaluation and other management of sinusitis, allergies, asthma, hyperglycemia

## 2019-12-24 ENCOUNTER — Ambulatory Visit: Payer: Self-pay

## 2019-12-24 ENCOUNTER — Encounter: Payer: Self-pay | Admitting: Orthopaedic Surgery

## 2019-12-24 ENCOUNTER — Ambulatory Visit (INDEPENDENT_AMBULATORY_CARE_PROVIDER_SITE_OTHER): Payer: BC Managed Care – PPO | Admitting: Orthopaedic Surgery

## 2019-12-24 DIAGNOSIS — M25562 Pain in left knee: Secondary | ICD-10-CM

## 2019-12-24 MED ORDER — MELOXICAM 7.5 MG PO TABS: 7.5000 mg | ORAL_TABLET | Freq: Two times a day (BID) | ORAL | 2 refills | Status: DC | PRN

## 2019-12-24 NOTE — Progress Notes (Signed)
Office Visit Note   Patient: Terri Fletcher           Date of Birth: 14-Aug-1960           MRN: [id number] Visit Date: 12/24/2019              Requested by: Corwin Levins, MD [address] [place],  [place] [address] PCP: Corwin Levins, MD   Assessment & Plan: Visit Diagnoses:  1. Acute pain of left knee     Plan: Impression is acute left knee injury.  I am unclear as to the exact etiology but x-rays and exam are relatively reassuring.  I recommend relative rest activity modification as well as 2-week course of Mobic.  Ice as needed.  Follow-up if she continues to have symptoms.  Follow-Up Instructions: Return if symptoms worsen or fail to improve.   Orders:  Orders Placed This Encounter  Procedures   XR KNEE 3 VIEW LEFT   Meds ordered this encounter  Medications   meloxicam (MOBIC) 7.5 MG tablet    Sig: Take 1 tablet (7.5 mg total) by mouth 2 (two) times daily as needed for pain.    Dispense:  30 tablet    Refill:  2      Procedures: No procedures performed   Clinical Data: No additional findings.   Subjective: Chief Complaint  Patient presents with   Left Knee - Pain    Terri Fletcher is 59 year old female who I have seen in the past who comes in with an acute injury to her left knee about 3 weeks ago.  She states that she was leaning over the bed and hyperextended when she felt a pop throughout the knee.  Originally there was swelling but this has resolved.  She denies any mechanical symptoms.  She has difficulty with squatting stairs and kneeling due to the pain and the knee feels weak and soft when she is walking especially when she is twisting and turning.   Review of Systems  Constitutional: Negative.   HENT: Negative.   Eyes: Negative.   Respiratory: Negative.   Cardiovascular: Negative.   Endocrine: Negative.   Musculoskeletal: Negative.   Neurological: Negative.   Hematological: Negative.   Psychiatric/Behavioral: Negative.   All other systems  reviewed and are negative.    Objective: Vital Signs: There were no vitals taken for this visit.  Physical Exam Vitals and nursing note reviewed.  Constitutional:      Appearance: She is well-developed.  Pulmonary:     Effort: Pulmonary effort is normal.  Skin:    General: Skin is warm.     Capillary Refill: Capillary refill takes less than 2 seconds.  Neurological:     Mental Status: She is alert and oriented to person, place, and time.  Psychiatric:        Behavior: Behavior normal.        Thought Content: Thought content normal.        Judgment: Judgment normal.     Ortho Exam Left knee shows no joint effusion.  Extensor mechanism intact.  Collaterals and cruciates are stable.  Mild joint line tenderness on both sides.  Patella tracking is normal. Specialty Comments:  No specialty comments available.  Imaging: XR KNEE 3 VIEW LEFT  Result Date: 12/24/2019 No acute or structural abnormalities    PMFS History: Patient Active Problem List   Diagnosis Date Noted   Sinusitis 12/22/2019   Hyperglycemia 08/22/2018   Pain in left wrist 11/15/2017  Ganglion of left wrist 09/14/2017   Leg cramping 09/14/2017   Night sweats 09/14/2017   Weight loss 09/14/2017   Upper respiratory tract infection 04/20/2017   Left groin pain 10/19/2016   Abdominal pain, epigastric 03/19/2013   Left lumbar radiculopathy 02/07/2013   Elevated blood pressure reading without diagnosis of hypertension 02/07/2013   Left breast mass 12/06/2012   Hematochezia 10/09/2012   LLQ pain 09/25/2012   Frequency of urination 09/05/2012   Preventative health care 05/30/2011   Anxiety state 03/01/2010   COMMON MIGRAINE 03/01/2010   DIARRHEA, RECURRENT 03/01/2010   INSOMNIA, PERSISTENT 01/04/2008   Allergic rhinitis 05/21/2007   Asthma 03/04/2007   Past Medical History:  Diagnosis Date   ALLERGIC RHINITIS    ANXIETY, SITUATIONAL    ASTHMA    COMMON MIGRAINE     DIARRHEA, RECURRENT    Impaired glucose tolerance 09/25/2012   INSOMNIA, PERSISTENT     Family History  Problem Relation Age of Onset   Cancer Mother        Breast   Cancer Maternal Grandmother    Heart attack Maternal Grandfather     Past Surgical History:  Procedure Laterality Date   BREAST SURGERY  11/05/2012   excision left breast mass   CESAREAN SECTION  06/25/1982   CESAREAN SECTION  11/25/1978   Social History   Occupational History   Not on file  Tobacco Use   Smoking status: Former Smoker    Types: Cigarettes   Smokeless tobacco: Never Used  Substance and Sexual Activity   Alcohol use: Yes    Alcohol/week: 0.0 standard drinks    Comment: Occasional.   Drug use: No    Types: Marijuana    Comment: Occassional.   Sexual activity: Not on file

## 2019-12-29 ENCOUNTER — Encounter: Payer: Self-pay | Admitting: Internal Medicine

## 2019-12-31 MED ORDER — DIAZEPAM 5 MG PO TABS: ORAL_TABLET | ORAL | 2 refills | Status: DC

## 2020-01-29 ENCOUNTER — Encounter: Payer: Self-pay | Admitting: Internal Medicine

## 2020-01-29 MED ORDER — ZOLPIDEM TARTRATE 10 MG PO TABS: 10.0000 mg | ORAL_TABLET | Freq: Every evening | ORAL | 1 refills | Status: DC | PRN

## 2020-01-30 NOTE — Telephone Encounter (Signed)
Ok I think the pt is asking for PA for the 10 mg

## 2020-02-04 NOTE — Telephone Encounter (Signed)
Ok for me for trying the PA, I dont see a question or other concern here

## 2020-02-05 NOTE — Telephone Encounter (Signed)
PA was submitted via cover-my-meds.  (Key #  [id number]) waiting on insurance response.Marland KitchenRaechel Chute

## 2020-02-10 NOTE — Telephone Encounter (Signed)
Check the status of PA received msg " Med Approved on February 06, 2020,  Case [id number]; Status:Approved; Review Type:Qty;Coverage. Start Date:01/06/2020;Coverage End Date:02/04/2021.Marland KitchenRaechel Chute

## 2020-03-09 IMAGING — DX DG CHEST 2V
2 series · 2 of 2 positions shown · non-contrast
Comparison: Chest x-ray of 04/06/2015

CLINICAL DATA: Recent weight loss, night sweats, history of asthma

EXAM:
CHEST - 2 VIEW

[chest pa]
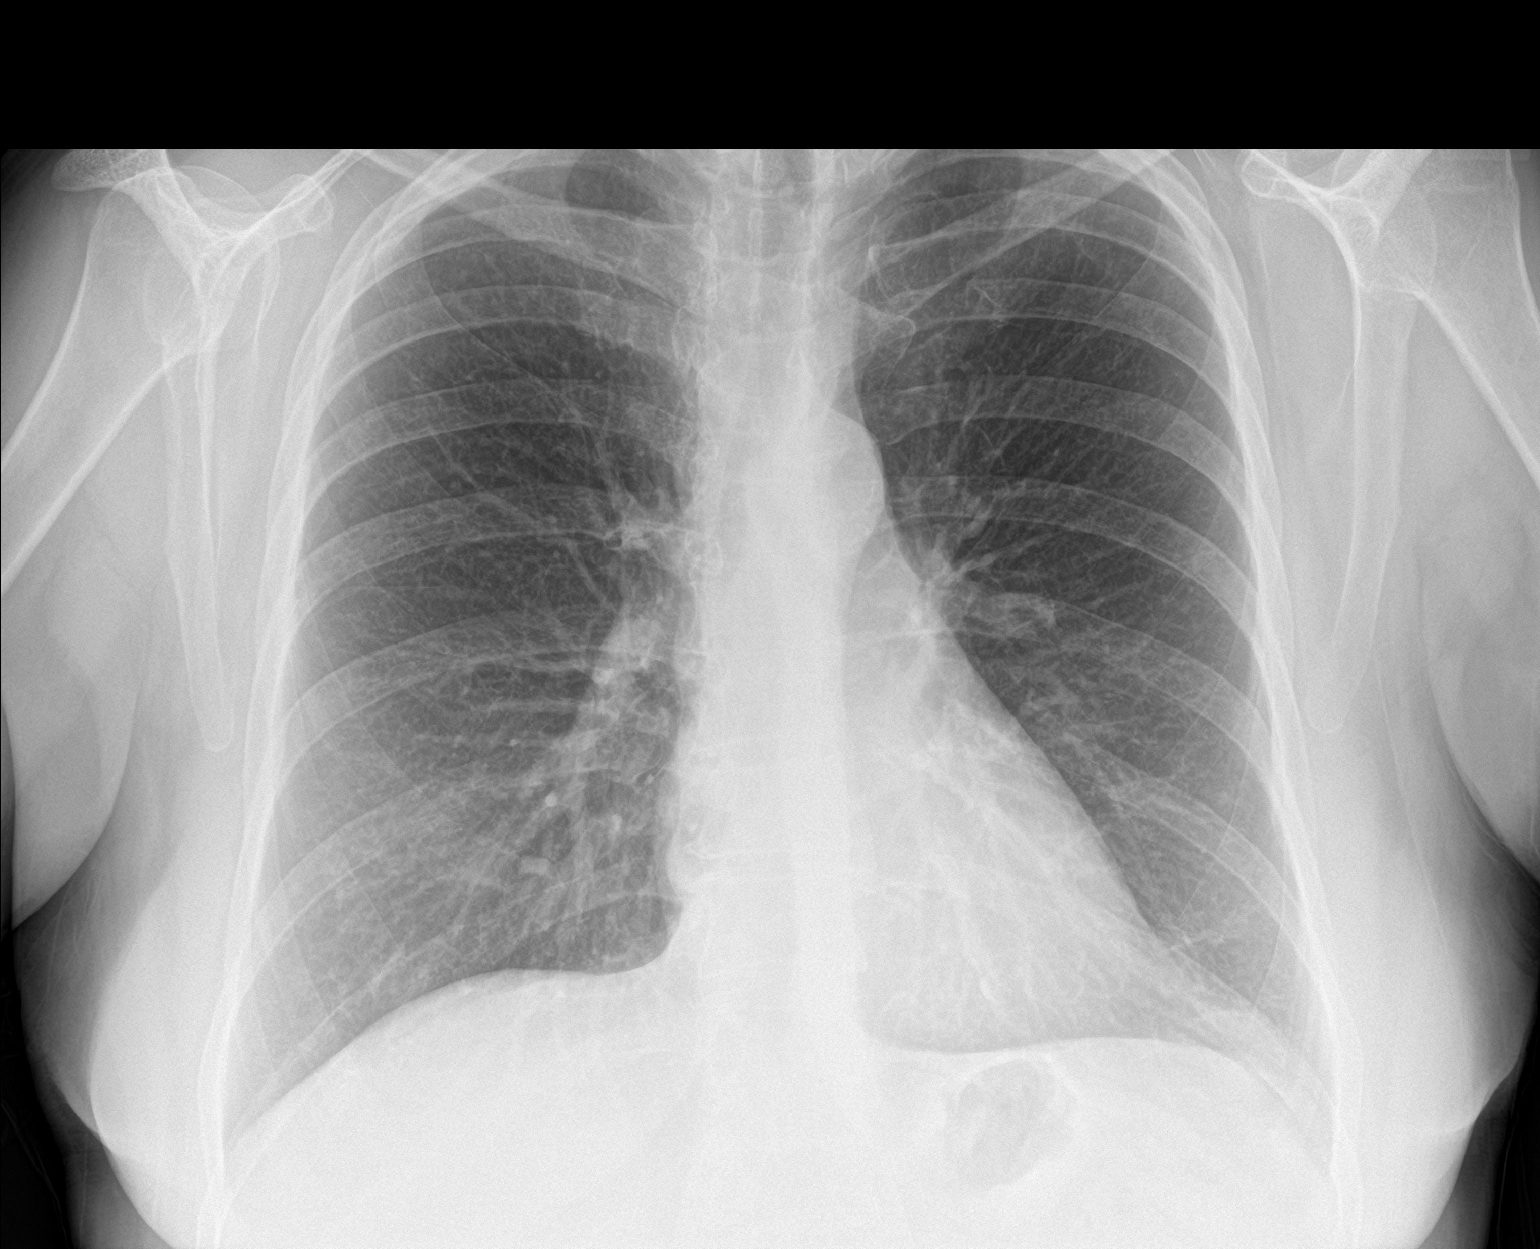

[chest lat]
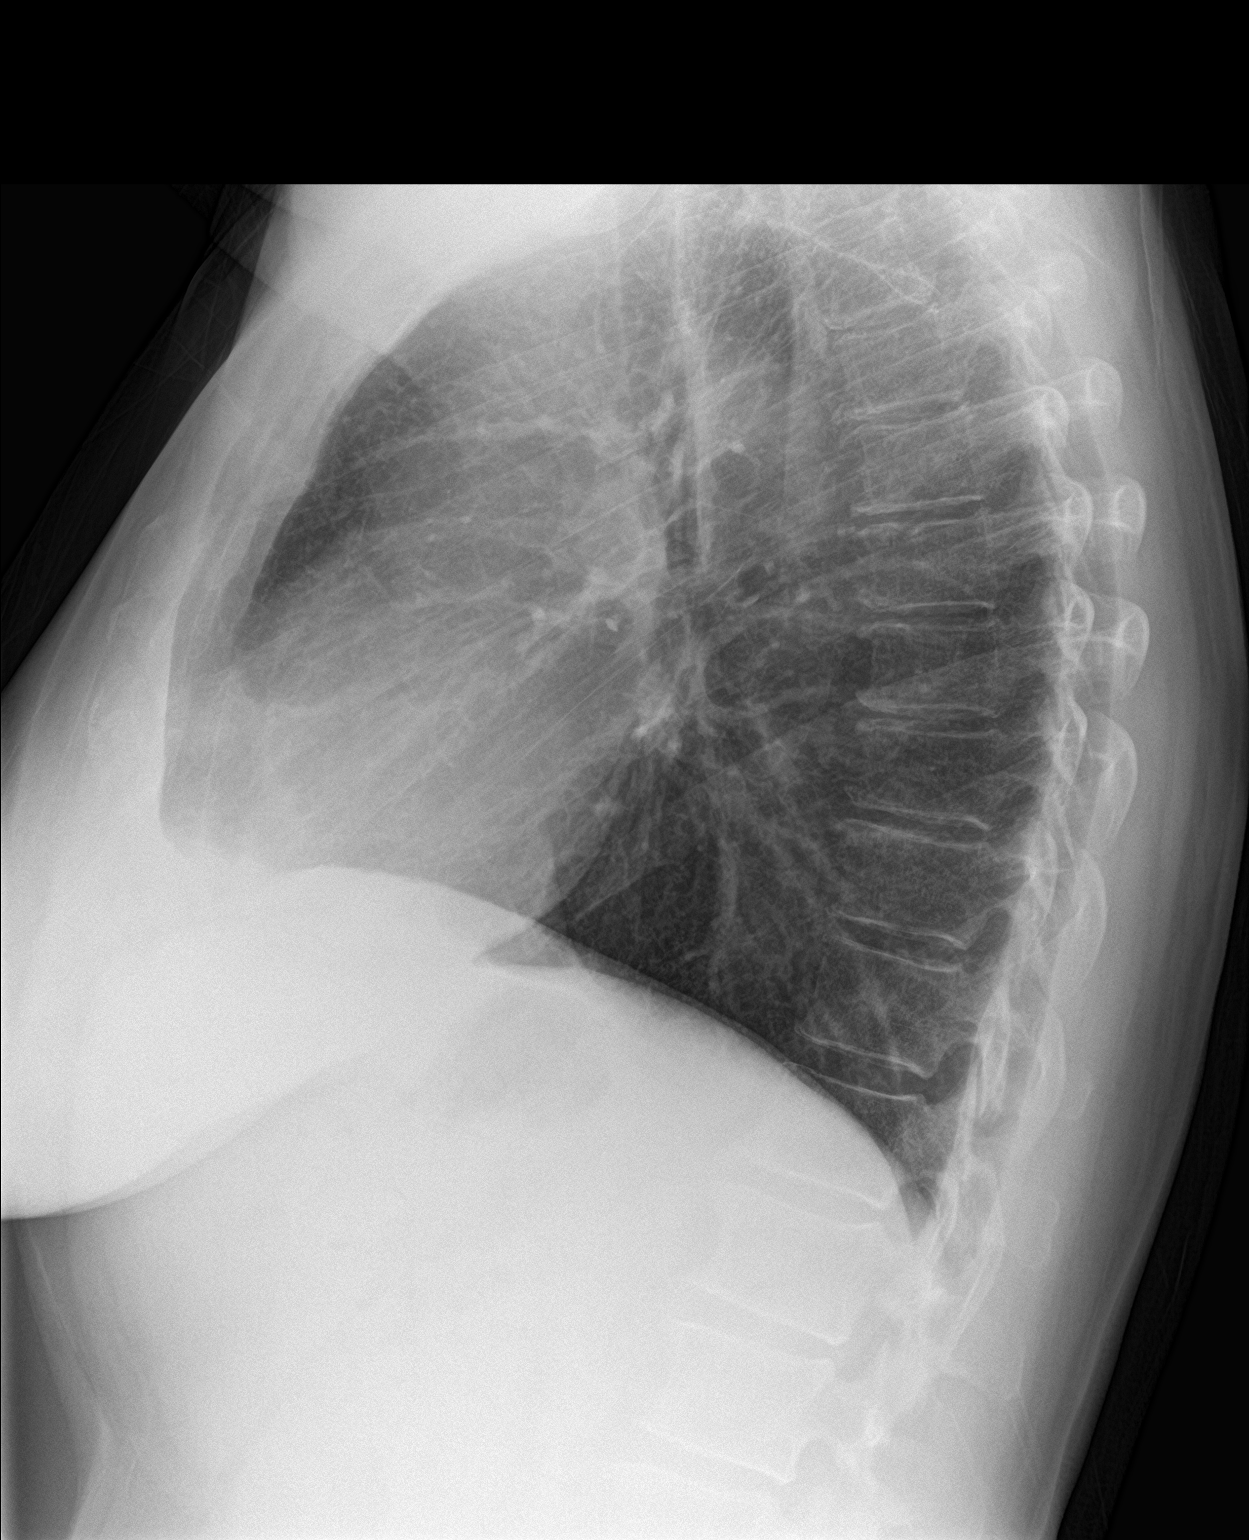

[2 of 2 positions shown; findings below may reference images not displayed]

FINDINGS: No active infiltrate or effusion is seen. Mild peribronchial
thickening is again noted consistent with asthma bronchitis.
Mediastinal and hilar contours are unremarkable. The heart is within
normal limits in size. No bony abnormality is seen.
IMPRESSION: 1. No pneumonia or effusion.
2. Mild peribronchial thickening consistent with asthma /bronchitis.

## 2020-03-16 ENCOUNTER — Encounter: Payer: Self-pay | Admitting: Internal Medicine

## 2020-03-16 ENCOUNTER — Telehealth (INDEPENDENT_AMBULATORY_CARE_PROVIDER_SITE_OTHER): Payer: BC Managed Care – PPO | Admitting: Internal Medicine

## 2020-03-16 DIAGNOSIS — J45909 Unspecified asthma, uncomplicated: Secondary | ICD-10-CM | POA: Diagnosis not present

## 2020-03-16 MED ORDER — HYDROCOD POLST-CPM POLST ER 10-8 MG/5ML PO SUER
5.0000 mL | Freq: Two times a day (BID) | ORAL | 0 refills | Status: DC | PRN
Start: 2020-03-16 — End: 2021-04-22

## 2020-03-16 MED ORDER — PREDNISONE 10 MG (21) PO TBPK: ORAL_TABLET | ORAL | 0 refills | Status: DC

## 2020-03-16 MED ORDER — AMOXICILLIN 500 MG PO CAPS
500.0000 mg | ORAL_CAPSULE | Freq: Three times a day (TID) | ORAL | 0 refills | Status: DC
Start: 2020-03-16 — End: 2021-04-22

## 2020-03-16 NOTE — Progress Notes (Signed)
Virtual Visit via Video Note  I connected with Terri Fletcher on 03/16/20 at  9:45 AM EDT by a video enabled telemedicine application and verified that I am speaking with the correct person using two identifiers.   I discussed the limitations of evaluation and management by telemedicine and the availability of in person appointments. The patient expressed understanding and agreed to proceed.  Present for the visit:  Myself, Dr Cheryll Cockayne, [name].  The patient is currently at home and I am in the office.    No referring provider.    History of Present Illness: She is here for an acute visit for cold symptoms.   Her symptoms started three days ago.    She thinks she has bronchitis and a sinus infection.   She is experiencing nasal congestion, sinus pain, cough that is productive, shortness of breath with moderate exertion and wheezing.  She also states sinus headaches.  The symptoms are all typical of what she has had in the past with her sinus infections and her bronchitis.  She has tried taking mucinex, dayquil/nyquil  A rapid covid test was negative at home.      Review of Systems  Constitutional: Negative for chills and fever.  HENT: Positive for congestion and sinus pain. Negative for ear pain and sore throat.   Respiratory: Positive for cough, sputum production, shortness of breath (w moderate exertion) and wheezing.   Gastrointestinal: Negative for abdominal pain and diarrhea.  Musculoskeletal: Negative for myalgias.  Neurological: Positive for headaches (sinus). Negative for dizziness.      Social History   Socioeconomic History  . Marital status: Married    Spouse name: Not on file  . Number of children: Not on file  . Years of education: Not on file  . Highest education level: Not on file  Occupational History  . Not on file  Tobacco Use  . Smoking status: Former Smoker    Types: Cigarettes  . Smokeless tobacco: Never Used  Substance and Sexual Activity   . Alcohol use: Yes    Alcohol/week: 0.0 standard drinks    Comment: Occasional.  . Drug use: No    Types: Marijuana    Comment: Occassional.  . Sexual activity: Not on file  Other Topics Concern  . Not on file  Social History Narrative   Regular exercise   Social Determinants of Health   Financial Resource Strain:   . Difficulty of Paying Living Expenses: Not on file  Food Insecurity:   . Worried About Programme researcher, broadcasting/film/video in the Last Year: Not on file  . Ran Out of Food in the Last Year: Not on file  Transportation Needs:   . Lack of Transportation (Medical): Not on file  . Lack of Transportation (Non-Medical): Not on file  Physical Activity:   . Days of Exercise per Week: Not on file  . Minutes of Exercise per Session: Not on file  Stress:   . Feeling of Stress : Not on file  Social Connections:   . Frequency of Communication with Friends and Family: Not on file  . Frequency of Social Gatherings with Friends and Family: Not on file  . Attends Religious Services: Not on file  . Active Member of Clubs or Organizations: Not on file  . Attends Banker Meetings: Not on file  . Marital Status: Not on file     Observations/Objective: Appears well in NAD Breathing normally.  Intermittent bronchial sounding cough. Skin appears warm and  dry  Assessment and Plan:  Acute asthmatic bronchitis Symptoms started 3 days ago and are consistent with asthmatic bronchitis, possible sinus infection This is typical for her.  Rapid Covid test was negative Continue Mucinex, DayQuil, NyQuil She has a inhaler at home that she can use as needed, which she has been doing Start amoxicillin 3 times daily x10 days Start prednisone taper Test next cough syrup as needed Call if no improvement or if symptoms worsen   Follow Up Instructions:    I discussed the assessment and treatment plan with the patient. The patient was provided an opportunity to ask questions and all were  answered. The patient agreed with the plan and demonstrated an understanding of the instructions.   The patient was advised to call back or seek an in-person evaluation if the symptoms worsen or if the condition fails to improve as anticipated.    Pincus Sanes, MD

## 2020-04-13 ENCOUNTER — Encounter: Payer: Self-pay | Admitting: Internal Medicine

## 2020-04-13 MED ORDER — FLUCONAZOLE 150 MG PO TABS: ORAL_TABLET | ORAL | 1 refills | Status: DC

## 2020-05-21 ENCOUNTER — Encounter: Payer: Self-pay | Admitting: Internal Medicine

## 2020-05-21 MED ORDER — DIAZEPAM 5 MG PO TABS: ORAL_TABLET | ORAL | 2 refills | Status: DC

## 2020-07-16 ENCOUNTER — Encounter: Payer: Self-pay | Admitting: Internal Medicine

## 2020-07-17 ENCOUNTER — Other Ambulatory Visit: Payer: Self-pay

## 2020-07-20 ENCOUNTER — Ambulatory Visit: Payer: BC Managed Care – PPO | Admitting: Internal Medicine

## 2020-07-20 ENCOUNTER — Encounter: Payer: Self-pay | Admitting: Internal Medicine

## 2020-07-20 ENCOUNTER — Other Ambulatory Visit: Payer: Self-pay

## 2020-07-20 VITALS — BP 170/94 | HR 74 | Temp 98.4°F | Ht 72.0 in | Wt 220.0 lb

## 2020-07-20 DIAGNOSIS — I10 Essential (primary) hypertension: Secondary | ICD-10-CM | POA: Diagnosis not present

## 2020-07-20 DIAGNOSIS — R739 Hyperglycemia, unspecified: Secondary | ICD-10-CM

## 2020-07-20 DIAGNOSIS — E538 Deficiency of other specified B group vitamins: Secondary | ICD-10-CM | POA: Diagnosis not present

## 2020-07-20 DIAGNOSIS — E559 Vitamin D deficiency, unspecified: Secondary | ICD-10-CM | POA: Diagnosis not present

## 2020-07-20 DIAGNOSIS — Z0001 Encounter for general adult medical examination with abnormal findings: Secondary | ICD-10-CM | POA: Diagnosis not present

## 2020-07-20 MED ORDER — OLMESARTAN MEDOXOMIL 20 MG PO TABS: 20.0000 mg | ORAL_TABLET | Freq: Every day | ORAL | 3 refills | Status: DC

## 2020-07-20 NOTE — Assessment & Plan Note (Signed)
Lab Results  Component Value Date   HGBA1C 5.7 08/22/2018   Stable, pt to continue current medical treatment  - diet control  Current Outpatient Medications (Endocrine & Metabolic):  .  predniSONE (STERAPRED UNI-PAK 21 TAB) 10 MG (21) TBPK tablet, Take as directed (Patient not taking: Reported on 07/20/2020)  Current Outpatient Medications (Cardiovascular):  .  olmesartan (BENICAR) 20 MG tablet, Take 1 tablet (20 mg total) by mouth daily.  Current Outpatient Medications (Respiratory):  .  albuterol (PROAIR HFA) 108 (90 Base) MCG/ACT inhaler, INHALE TWO PUFFS BY MOUTH FOUR TIMES DAILY FOR WHEEZING (Patient not taking: Reported on 07/20/2020) .  chlorpheniramine-HYDROcodone (TUSSIONEX PENNKINETIC ER) 10-8 MG/5ML SUER, Take 5 mLs by mouth every 12 (twelve) hours as needed for cough. (Patient not taking: Reported on 07/20/2020)  Current Outpatient Medications (Analgesics):  .  acetaminophen (TYLENOL) 325 MG tablet, Take 650 mg by mouth every 6 (six) hours as needed. .  meloxicam (MOBIC) 7.5 MG tablet, Take 1 tablet (7.5 mg total) by mouth 2 (two) times daily as needed for pain. (Patient not taking: Reported on 07/20/2020)   Current Outpatient Medications (Other):  .  diazepam (VALIUM) 5 MG tablet, 1/2 - 1 tab by mouth daily as needed .  zolpidem (AMBIEN) 10 MG tablet, Take 1 tablet (10 mg total) by mouth at bedtime as needed for sleep. Marland Kitchen  amoxicillin (AMOXIL) 500 MG capsule, Take 1 capsule (500 mg total) by mouth 3 (three) times daily. (Patient not taking: Reported on 07/20/2020) .  fluconazole (DIFLUCAN) 150 MG tablet, Take 1 by mouth every 3 days as needed for yeast infection (Patient not taking: Reported on 07/20/2020) .  gabapentin (NEURONTIN) 300 MG capsule, TAKE ONE CAPSULE BY MOUTH 3 TIMES A DAY (Patient not taking: Reported on 07/20/2020)

## 2020-07-20 NOTE — Assessment & Plan Note (Signed)
New onset uncontrolled, possibly related to worsening wt gain, to start benicar 20 qd, ecg reviewed

## 2020-07-20 NOTE — Assessment & Plan Note (Signed)
Age and sex appropriate education and counseling updated with regular exercise and diet Referrals for preventative services - for pap and mammogram pt to call Immunizations addressed - none needed Smoking counseling  - none needed Evidence for depression or other mood disorder - none significant Most recent labs reviewed. I have personally reviewed and have noted: 1) the patient's medical and social history 2) The patient's current medications and supplements 3) The patient's height, weight, and BMI have been recorded in the chart

## 2020-07-20 NOTE — Progress Notes (Signed)
Established Patient Office Visit  Subjective:  Patient ID: Terri Fletcher, female    DOB: 10-27-1960  Age: 60 y.o. MRN: [id number]       Chief Complaint:: wellness exam and Hypertension  new onset worsening       HPI:  Terri Fletcher is a 60 y.o. female female here for wellness exam, due for mammogram - plans to call for pap as well. Last cologuard mar 2020 - declines colonoscopy.   Wt Readings from Last 3 Encounters:  07/20/20 220 lb (99.8 kg)  08/22/18 194 lb (88 kg)  09/14/17 200 lb (90.7 kg)   BP Readings from Last 3 Encounters:  07/20/20 (!) 170/94  08/22/18 122/80  09/14/17 128/86   There is no immunization history on file for this patient. There are no preventive care reminders to display for this patient.      Also BP at home has been more like 140-160's, despite lower salt diet.  Wt has been increased maybe 10 lbs per pt ecently,, no regular exercise    Pt denies chest pain, increased sob or doe, wheezing, orthopnea, PND, increased LE swelling, palpitations, dizziness or syncope.  Pt denies new neurological symptoms such as new headache, or facial or extremity weakness or numbness   Pt denies polydipsia, polyuria  Past Medical History:  Diagnosis Date  . ALLERGIC RHINITIS   . ANXIETY, SITUATIONAL   . ASTHMA   . COMMON MIGRAINE   . DIARRHEA, RECURRENT   . Impaired glucose tolerance 09/25/2012  . INSOMNIA, PERSISTENT    Past Surgical History:  Procedure Laterality Date  . BREAST SURGERY  11/05/2012   excision left breast mass  . CESAREAN SECTION  06/25/1982  . CESAREAN SECTION  11/25/1978    reports that she has quit smoking. Her smoking use included cigarettes. She has never used smokeless tobacco. She reports current alcohol use. She reports that she does not use drugs. family history includes Cancer in her maternal grandmother and mother; Heart attack in her maternal grandfather. Allergies  Allergen Reactions  . Haldol [Haloperidol Lactate] Other (See Comments)    Lock Jaw   . Cefuroxime Axetil     REACTION: nausea  . Citalopram Other (See Comments)    dizzy   Current Outpatient Medications on File Prior to Visit  Medication Sig Dispense Refill  . acetaminophen (TYLENOL) 325 MG tablet Take 650 mg by mouth every 6 (six) hours as needed.    . diazepam (VALIUM) 5 MG tablet 1/2 - 1 tab by mouth daily as needed 30 tablet 2  . zolpidem (AMBIEN) 10 MG tablet Take 1 tablet (10 mg total) by mouth at bedtime as needed for sleep. 90 tablet 1  . albuterol (PROAIR HFA) 108 (90 Base) MCG/ACT inhaler INHALE TWO PUFFS BY MOUTH FOUR TIMES DAILY FOR WHEEZING (Patient not taking: Reported on 07/20/2020) 8.5 each 11  . amoxicillin (AMOXIL) 500 MG capsule Take 1 capsule (500 mg total) by mouth 3 (three) times daily. (Patient not taking: Reported on 07/20/2020) 30 capsule 0  . chlorpheniramine-HYDROcodone (TUSSIONEX PENNKINETIC ER) 10-8 MG/5ML SUER Take 5 mLs by mouth every 12 (twelve) hours as needed for cough. (Patient not taking: Reported on 07/20/2020) 100 mL 0  . fluconazole (DIFLUCAN) 150 MG tablet Take 1 by mouth every 3 days as needed for yeast infection (Patient not taking: Reported on 07/20/2020) 2 tablet 1  . gabapentin (NEURONTIN) 300 MG capsule TAKE ONE CAPSULE BY MOUTH 3 TIMES A DAY (Patient not taking: Reported on 07/20/2020) 90 capsule  2  . meloxicam (MOBIC) 7.5 MG tablet Take 1 tablet (7.5 mg total) by mouth 2 (two) times daily as needed for pain. (Patient not taking: Reported on 07/20/2020) 30 tablet 2  . predniSONE (STERAPRED UNI-PAK 21 TAB) 10 MG (21) TBPK tablet Take as directed (Patient not taking: Reported on 07/20/2020) 21 tablet 0   No current facility-administered medications on file prior to visit.        ROS:  All others reviewed and negative.  Objective        PE:  BP (!) 170/94   Pulse 74   Temp 98.4 F (36.9 C) (Oral)   Ht 6' (1.829 m)   Wt 220 lb (99.8 kg)   SpO2 97%   BMI 29.84 kg/m                 Constitutional: Pt appears in NAD                HENT: Head: NCAT.                Right Ear: External ear normal.                 Left Ear: External ear normal.                Eyes: . Pupils are equal, round, and reactive to light. Conjunctivae and EOM are normal               Nose: without d/c or deformity               Neck: Neck supple. Gross normal ROM               Cardiovascular: Normal rate and regular rhythm.                 Pulmonary/Chest: Effort normal and breath sounds without rales or wheezing.                Abd:  Soft, NT, ND, + BS, no organomegaly               Neurological: Pt is alert. At baseline orientation, motor grossly intact               Skin: Skin is warm. No rashes, no other new lesions, LE edema - none               Psychiatric: Pt behavior is normal without agitation   Assessment/Plan:  Chauntel Windsor is a 60 y.o. White or Caucasian [1] female with  has a past medical history of ALLERGIC RHINITIS, ANXIETY, SITUATIONAL, ASTHMA, COMMON MIGRAINE, DIARRHEA, RECURRENT, Impaired glucose tolerance (09/25/2012), and INSOMNIA, PERSISTENT.  Micro: none  Cardiac tracings I have personally interpreted today:  NSR - 80  Pertinent Radiological findings (summarize): none   Lab Results  Component Value Date   WBC 6.6 08/22/2018   HGB 14.7 08/22/2018   HCT 43.5 08/22/2018   PLT 225.0 08/22/2018   GLUCOSE 97 08/22/2018   CHOL 180 08/22/2018   TRIG 129.0 08/22/2018   HDL 60.60 08/22/2018   LDLCALC 93 08/22/2018   ALT 15 08/22/2018   AST 12 08/22/2018   NA 141 08/22/2018   K 3.4 (L) 08/22/2018   CL 104 08/22/2018   CREATININE 0.78 08/22/2018   BUN 12 08/22/2018   CO2 29 08/22/2018   TSH 1.33 08/22/2018   HGBA1C 5.7 08/22/2018     Assessment & Plan:   Problem List Items Addressed This  Visit      High   Encounter for well adult exam with abnormal findings - Primary    Age and sex appropriate education and counseling updated with regular exercise and diet Referrals for preventative services - for pap and  mammogram pt to call Immunizations addressed - none needed Smoking counseling  - none needed Evidence for depression or other mood disorder - none significant Most recent labs reviewed. I have personally reviewed and have noted: 1) the patient's medical and social history 2) The patient's current medications and supplements 3) The patient's height, weight, and BMI have been recorded in the chart         Medium   Hypertension    New onset uncontrolled, possibly related to worsening wt gain, to start benicar 20 qd, ecg reviewed      Relevant Medications   olmesartan (BENICAR) 20 MG tablet   Other Relevant Orders   Lipid panel   Hepatic function panel   CBC with Differential/Platelet   TSH   Urinalysis, Routine w reflex microscopic   Basic metabolic panel   EKG 12-Lead   Hyperglycemia    Lab Results  Component Value Date   HGBA1C 5.7 08/22/2018   Stable, pt to continue current medical treatment  - diet control  Current Outpatient Medications (Endocrine & Metabolic):  .  predniSONE (STERAPRED UNI-PAK 21 TAB) 10 MG (21) TBPK tablet, Take as directed (Patient not taking: Reported on 07/20/2020)  Current Outpatient Medications (Cardiovascular):  .  olmesartan (BENICAR) 20 MG tablet, Take 1 tablet (20 mg total) by mouth daily.  Current Outpatient Medications (Respiratory):  .  albuterol (PROAIR HFA) 108 (90 Base) MCG/ACT inhaler, INHALE TWO PUFFS BY MOUTH FOUR TIMES DAILY FOR WHEEZING (Patient not taking: Reported on 07/20/2020) .  chlorpheniramine-HYDROcodone (TUSSIONEX PENNKINETIC ER) 10-8 MG/5ML SUER, Take 5 mLs by mouth every 12 (twelve) hours as needed for cough. (Patient not taking: Reported on 07/20/2020)  Current Outpatient Medications (Analgesics):  .  acetaminophen (TYLENOL) 325 MG tablet, Take 650 mg by mouth every 6 (six) hours as needed. .  meloxicam (MOBIC) 7.5 MG tablet, Take 1 tablet (7.5 mg total) by mouth 2 (two) times daily as needed for pain. (Patient not  taking: Reported on 07/20/2020)   Current Outpatient Medications (Other):  .  diazepam (VALIUM) 5 MG tablet, 1/2 - 1 tab by mouth daily as needed .  zolpidem (AMBIEN) 10 MG tablet, Take 1 tablet (10 mg total) by mouth at bedtime as needed for sleep. Marland Kitchen  amoxicillin (AMOXIL) 500 MG capsule, Take 1 capsule (500 mg total) by mouth 3 (three) times daily. (Patient not taking: Reported on 07/20/2020) .  fluconazole (DIFLUCAN) 150 MG tablet, Take 1 by mouth every 3 days as needed for yeast infection (Patient not taking: Reported on 07/20/2020) .  gabapentin (NEURONTIN) 300 MG capsule, TAKE ONE CAPSULE BY MOUTH 3 TIMES A DAY (Patient not taking: Reported on 07/20/2020)       Relevant Orders   Hemoglobin A1c    Other Visit Diagnoses    B12 deficiency       Relevant Orders   Vitamin B12   Vitamin D deficiency       Relevant Orders   VITAMIN D 25 Hydroxy (Vit-D Deficiency, Fractures)      Meds ordered this encounter  Medications  . olmesartan (BENICAR) 20 MG tablet    Sig: Take 1 tablet (20 mg total) by mouth daily.    Dispense:  90 tablet    Refill:  3    Follow-up: Return in about 6 months (around 01/17/2021).    Oliver Barre, MD 07/20/2020 9:37 AM [facility]  Primary Care - [facility]

## 2020-07-20 NOTE — Patient Instructions (Signed)
Your EKG was [facility] today  Please remember to call for your mammogram and pap smear appts  Please take all new medication as prescribed - the olmesartan 20 mg per day  Please continue to monitor your BP at home with the goal to be on average at least less tan 140/90;  After the first 5 days on the medication, please check your BP randomly once per day and let us know the average BP after that  Please continue all other medications as before, and refills have been done if requested.  Please have the pharmacy call with any other refills you may need.  Please continue your efforts at being more active, low cholesterol diet, and weight control.  You are otherwise up to date with prevention measures today.  Please keep your appointments with your specialists as you may have planned  Please go to the LAB at the blood drawing area for the tests to be done  You will be contacted by phone if any changes need to be made immediately.  Otherwise, you will receive a letter about your results with an explanation, but please check with MyChart first.  Please remember to sign up for MyChart if you have not done so, as this will be important to you in the future with finding out test results, communicating by private email, and scheduling acute appointments online when needed.  Please make an Appointment to return in 6 months, or sooner if needed

## 2020-07-21 MED ORDER — ZOLPIDEM TARTRATE 10 MG PO TABS: 10.0000 mg | ORAL_TABLET | Freq: Every evening | ORAL | 1 refills | Status: DC | PRN

## 2020-08-01 ENCOUNTER — Encounter: Payer: Self-pay | Admitting: Internal Medicine

## 2020-10-16 ENCOUNTER — Encounter: Payer: Self-pay | Admitting: Internal Medicine

## 2020-10-16 MED ORDER — DIAZEPAM 5 MG PO TABS: ORAL_TABLET | ORAL | 2 refills | Status: DC

## 2020-10-28 ENCOUNTER — Ambulatory Visit: Payer: Self-pay

## 2020-10-28 ENCOUNTER — Ambulatory Visit: Payer: BC Managed Care – PPO | Admitting: Orthopaedic Surgery

## 2020-10-28 DIAGNOSIS — M5442 Lumbago with sciatica, left side: Secondary | ICD-10-CM

## 2020-10-28 DIAGNOSIS — G8929 Other chronic pain: Secondary | ICD-10-CM

## 2020-10-28 MED ORDER — NAPROXEN 500 MG PO TABS: 500.0000 mg | ORAL_TABLET | Freq: Two times a day (BID) | ORAL | 2 refills | Status: DC

## 2020-10-28 MED ORDER — GABAPENTIN 300 MG PO CAPS: ORAL_CAPSULE | ORAL | 2 refills | Status: AC

## 2020-10-28 MED ORDER — PREDNISONE 10 MG (21) PO TBPK: ORAL_TABLET | ORAL | 0 refills | Status: DC

## 2020-10-28 MED ORDER — METHOCARBAMOL 500 MG PO TABS: 500.0000 mg | ORAL_TABLET | Freq: Two times a day (BID) | ORAL | 0 refills | Status: DC | PRN

## 2020-10-28 NOTE — Progress Notes (Signed)
Office Visit Note   Patient: Terri Fletcher           Date of Birth: 1960/06/29           MRN: [id number] Visit Date: 10/28/2020              Requested by: Corwin Levins, MD [address] [place],  [place] [address] PCP: Corwin Levins, MD   Assessment & Plan: Visit Diagnoses:  1. Chronic left-sided low back pain with left-sided sciatica     Plan: Impression is left lower extremity sciatica.  We have started the patient on a steroid taper, muscle relaxer and gabapentin.  Have also sent in naproxen to start once she has completed the steroid.  She is not interested in physical therapy at this point but when her symptoms improve, she will let us know if she would like to proceed and we will submit a referral.  Follow-up with [place] as needed.  Follow-Up Instructions: Return if symptoms worsen or fail to improve.   Orders:  Orders Placed This Encounter  Procedures  . XR Lumbar Spine 2-3 Views   Meds ordered this encounter  Medications  . predniSONE (STERAPRED UNI-PAK 21 TAB) 10 MG (21) TBPK tablet    Sig: Take as directed    Dispense:  21 tablet    Refill:  0  . methocarbamol (ROBAXIN) 500 MG tablet    Sig: Take 1 tablet (500 mg total) by mouth 2 (two) times daily as needed.    Dispense:  20 tablet    Refill:  0  . gabapentin (NEURONTIN) 300 MG capsule    Sig: Take one pill on day one, one pill bid on day 2 and then one pill tid thereafter    Dispense:  90 capsule    Refill:  2  . naproxen (NAPROSYN) 500 MG tablet    Sig: Take 1 tablet (500 mg total) by mouth 2 (two) times daily with a meal.    Dispense:  60 tablet    Refill:  2      Procedures: No procedures performed   Clinical Data: No additional findings.   Subjective: Chief Complaint  Patient presents with  . Lower Back - Pain    HPI patient is a pleasant 60 year old female who comes in today with chronic low back pain and left lower extremity radiculopathy for the past 4 weeks.  She has had this type of  flareups since 2014.  Flareups are usually alleviated with gabapentin and naproxen.  This new flareup, has not responded well to this.  She has associated burning down the left leg.  Sitting a long time as well as lifting something heavy seems to aggravate her symptoms most.  She notes numbness, tingling and burning down her left lower leg and into the foot.  No previous ESI, physical therapy or surgical intervention on her back.  No bowel bladder change or saddle paresthesias.  Review of Systems as detailed in HPI.  All others reviewed and are negative.   Objective: Vital Signs: There were no vitals taken for this visit.  Physical Exam well-developed well-nourished female no acute distress.  Alert and oriented x3.  Ortho Exam lumbar spine shows mild to moderate paraspinous tenderness to the lower lumbar level.  Increased pain with lumbar flexion and extension.  Positive straight leg raise.  No focal weakness.  She is neurovascular intact distally.  Specialty Comments:  No specialty comments available.  Imaging: XR Lumbar Spine 2-3 Views  Result Date: 10/28/2020 Multilevel spondylosis worse at L4-5 L5-S1    PMFS History: Patient Active Problem List   Diagnosis Date Noted  . Hypertension 07/20/2020  . Acute asthmatic bronchitis 03/16/2020  . Sinusitis 12/22/2019  . Hyperglycemia 08/22/2018  . Pain in left wrist 11/15/2017  . Ganglion of left wrist 09/14/2017  . Leg cramping 09/14/2017  . Night sweats 09/14/2017  . Weight loss 09/14/2017  . Upper respiratory tract infection 04/20/2017  . Left groin pain 10/19/2016  . Abdominal pain, epigastric 03/19/2013  . Left lumbar radiculopathy 02/07/2013  . Elevated blood pressure reading without diagnosis of hypertension 02/07/2013  . Left breast mass 12/06/2012  . Hematochezia 10/09/2012  . LLQ pain 09/25/2012  . Frequency of urination 09/05/2012  . Encounter for well adult exam with abnormal findings 05/30/2011  . Anxiety state  03/01/2010  . COMMON MIGRAINE 03/01/2010  . DIARRHEA, RECURRENT 03/01/2010  . INSOMNIA, PERSISTENT 01/04/2008  . Allergic rhinitis 05/21/2007  . Asthma 03/04/2007   Past Medical History:  Diagnosis Date  . ALLERGIC RHINITIS   . ANXIETY, SITUATIONAL   . ASTHMA   . COMMON MIGRAINE   . DIARRHEA, RECURRENT   . Impaired glucose tolerance 09/25/2012  . INSOMNIA, PERSISTENT     Family History  Problem Relation Age of Onset  . Cancer Mother        Breast  . Cancer Maternal Grandmother   . Heart attack Maternal Grandfather     Past Surgical History:  Procedure Laterality Date  . BREAST SURGERY  11/05/2012   excision left breast mass  . CESAREAN SECTION  06/25/1982  . CESAREAN SECTION  11/25/1978   Social History   Occupational History  . Not on file  Tobacco Use  . Smoking status: Former Smoker    Types: Cigarettes  . Smokeless tobacco: Never Used  Substance and Sexual Activity  . Alcohol use: Yes    Alcohol/week: 0.0 standard drinks    Comment: Occasional.  . Drug use: No    Types: Marijuana    Comment: Occassional.  . Sexual activity: Not on file

## 2020-11-10 ENCOUNTER — Encounter: Payer: Self-pay | Admitting: Orthopaedic Surgery

## 2020-11-10 ENCOUNTER — Other Ambulatory Visit: Payer: Self-pay

## 2020-11-10 MED ORDER — METHYLPREDNISOLONE 4 MG PO TBPK: ORAL_TABLET | ORAL | 0 refills | Status: DC

## 2020-11-10 NOTE — Telephone Encounter (Signed)
Please refill prednisone dose pak.  Thanks.

## 2020-11-19 DIAGNOSIS — M5416 Radiculopathy, lumbar region: Secondary | ICD-10-CM | POA: Diagnosis not present

## 2020-12-03 ENCOUNTER — Other Ambulatory Visit: Payer: Self-pay | Admitting: Neurosurgery

## 2020-12-03 DIAGNOSIS — M5416 Radiculopathy, lumbar region: Secondary | ICD-10-CM

## 2020-12-26 ENCOUNTER — Ambulatory Visit
Admission: RE | Admit: 2020-12-26 | Discharge: 2020-12-26 | Disposition: A | Discharge status: Home / Self Care | Payer: BC Managed Care – PPO | Source: Ambulatory Visit | Attending: Neurosurgery | Admitting: Neurosurgery

## 2020-12-26 ENCOUNTER — Other Ambulatory Visit: Payer: Self-pay

## 2020-12-26 DIAGNOSIS — M5416 Radiculopathy, lumbar region: Secondary | ICD-10-CM

## 2020-12-26 DIAGNOSIS — M545 Low back pain, unspecified: Secondary | ICD-10-CM | POA: Diagnosis not present

## 2020-12-27 ENCOUNTER — Other Ambulatory Visit: Payer: Self-pay | Admitting: Internal Medicine

## 2021-01-08 ENCOUNTER — Encounter: Payer: Self-pay | Admitting: Internal Medicine

## 2021-01-11 DIAGNOSIS — Z6829 Body mass index (BMI) 29.0-29.9, adult: Secondary | ICD-10-CM | POA: Diagnosis not present

## 2021-01-11 DIAGNOSIS — M5416 Radiculopathy, lumbar region: Secondary | ICD-10-CM | POA: Diagnosis not present

## 2021-01-20 ENCOUNTER — Other Ambulatory Visit: Payer: Self-pay | Admitting: Internal Medicine

## 2021-01-20 ENCOUNTER — Encounter: Payer: Self-pay | Admitting: Internal Medicine

## 2021-01-20 MED ORDER — NIRMATRELVIR/RITONAVIR (PAXLOVID)TABLET: 3.0000 | ORAL_TABLET | Freq: Two times a day (BID) | ORAL | 0 refills | Status: AC

## 2021-02-25 ENCOUNTER — Other Ambulatory Visit: Payer: Self-pay | Admitting: Internal Medicine

## 2021-02-25 ENCOUNTER — Other Ambulatory Visit: Payer: Self-pay | Admitting: Physician Assistant

## 2021-04-22 ENCOUNTER — Encounter: Payer: Self-pay | Admitting: Family Medicine

## 2021-04-22 ENCOUNTER — Other Ambulatory Visit: Payer: Self-pay

## 2021-04-22 ENCOUNTER — Telehealth (INDEPENDENT_AMBULATORY_CARE_PROVIDER_SITE_OTHER): Payer: BC Managed Care – PPO | Admitting: Family Medicine

## 2021-04-22 VITALS — Temp 97.8°F

## 2021-04-22 DIAGNOSIS — R6889 Other general symptoms and signs: Secondary | ICD-10-CM

## 2021-04-22 MED ORDER — OSELTAMIVIR PHOSPHATE 75 MG PO CAPS: 75.0000 mg | ORAL_CAPSULE | Freq: Two times a day (BID) | ORAL | 0 refills | Status: DC

## 2021-04-22 MED ORDER — BENZONATATE 100 MG PO CAPS: ORAL_CAPSULE | ORAL | 0 refills | Status: DC

## 2021-04-22 NOTE — Patient Instructions (Signed)
  HOME CARE TIPS:  -COVID19 testing information: GoldAgenda.[email or web address]  Most pharmacies also offer testing and home test kits. If the Covid19 test is positive and you desire antiviral treatment, please contact a [facility] pharmacy or schedule a follow up virtual visit through your primary care office or through the [employer].  Other test to treat options: http://www.vasquez-[email or web address].[email or web address]_[email or web address]  -I sent the medication(s) we discussed to your pharmacy: Meds ordered this encounter  Medications   benzonatate (TESSALON PERLES) 100 MG capsule    Sig: 1-2 capsules up to twice daily as needed for cough    Dispense:  40 capsule    Refill:  0   oseltamivir (TAMIFLU) 75 MG capsule    Sig: Take 1 capsule (75 mg total) by mouth 2 (two) times daily.    Dispense:  10 capsule    Refill:  0    -stop the tamiflu if the covid test is positive  -use albuterol 2 puffs every 4-6 hours as needed - seek prompt inperson evaluation or follow up visit if worsening, struggling to breath, chest pain or asthma symptoms not responding to the albuterol  -can use tylenol if needed for fevers, aches and pains per instructions  -can use nasal saline a few times per day if you have nasal congestion; sometimes  a short course of Afrin nasal spray for 3 days can help with symptoms as well  -stay hydrated, drink plenty of fluids and eat small healthy meals - avoid dairy  -If the Covid test is positive, check out the [facility] website for more information on home care, transmission and treatment for COVID19  -follow up with your doctor in 2-3 days unless improving and feeling better  It was nice to meet you today, and I really hope you are feeling better soon. I help [facility] out with telemedicine visits on Tuesdays and Thursdays and am available for visits on those days. If you have any concerns or questions following this visit please schedule a follow up visit with your  Primary Care doctor or seek care at a local urgent care clinic to avoid delays in care.    Seek in person care or schedule a follow up video visit promptly if your symptoms worsen, new concerns arise or you are not improving with treatment. Call 911 and/or seek emergency care if your symptoms are severe or life threatening.

## 2021-04-22 NOTE — Progress Notes (Signed)
Virtual Visit via Video Note  I connected with Terri Fletcher  on 04/22/21 at  4:20 PM EDT by a video enabled telemedicine application and verified that I am speaking with the correct person using two identifiers.  Location patient: home, [place] Location provider:work or home office Persons participating in the virtual visit: patient, provider  I discussed the limitations of evaluation and management by telemedicine and the availability of in person appointments. The patient expressed understanding and agreed to proceed.   HPI:  Acute telemedicine visit for cough and congestion: -Onset: 2 days ago -Symptoms include:sinus congestion, sinus pressure, sore throat and a cough, body aches, chills, wheezing some - has not tried her albuterol yet -she has done 2 home covid tests which were negative -her grandkids have been sick -Denies: CP, NVD -Has tried: dayquil, delsym, has albuterol -Pertinent past medical history:see below, had [phone] -Pertinent medication allergies:  Allergies  Allergen Reactions   Haldol [Haloperidol Lactate] Other (See Comments)    Lock Jaw   Cefuroxime Axetil     REACTION: nausea   Citalopram Other (See Comments)    dizzy  -COVID-19 vaccine status: There is no immunization history on file for this patient.  ROS: See pertinent positives and negatives per HPI.  Past Medical History:  Diagnosis Date   ALLERGIC RHINITIS    ANXIETY, SITUATIONAL    ASTHMA    COMMON MIGRAINE    DIARRHEA, RECURRENT    Impaired glucose tolerance 09/25/2012   INSOMNIA, PERSISTENT     Past Surgical History:  Procedure Laterality Date   BREAST SURGERY  11/05/2012   excision left breast mass   CESAREAN SECTION  06/25/1982   CESAREAN SECTION  11/25/1978     Current Outpatient Medications:    benzonatate (TESSALON PERLES) 100 MG capsule, 1-2 capsules up to twice daily as needed for cough, Disp: 40 capsule, Rfl: 0   diazepam (VALIUM) 5 MG tablet, TAKE 1/2 - 1 TAB BY MOUTH DAILY AS NEEDED,  Disp: 30 tablet, Rfl: 2   gabapentin (NEURONTIN) 300 MG capsule, Take one pill on day one, one pill bid on day 2 and then one pill tid thereafter, Disp: 90 capsule, Rfl: 2   naproxen (NAPROSYN) 500 MG tablet, Take 1 tablet (500 mg total) by mouth 2 (two) times daily with a meal. (Patient taking differently: Take 500 mg by mouth.), Disp: 60 tablet, Rfl: 2   olmesartan (BENICAR) 20 MG tablet, Take 1 tablet (20 mg total) by mouth daily., Disp: 90 tablet, Rfl: 3   oseltamivir (TAMIFLU) 75 MG capsule, Take 1 capsule (75 mg total) by mouth 2 (two) times daily., Disp: 10 capsule, Rfl: 0   zolpidem (AMBIEN) 10 MG tablet, TAKE 1 TABLET BY MOUTH AT BEDTIME AS NEEDED FOR SLEEP., Disp: 90 tablet, Rfl: 1  EXAM:  VITALS per patient if applicable:  GENERAL: alert, oriented, appears well and in no acute distress  HEENT: atraumatic, conjunttiva clear, no obvious abnormalities on inspection of external nose and ears  NECK: normal movements of the head and neck  LUNGS: on inspection no signs of respiratory distress, breathing rate appears normal, no obvious gross SOB, gasping or wheezing  CV: no obvious cyanosis  MS: moves all visible extremities without noticeable abnormality  PSYCH/NEURO: pleasant and cooperative, no obvious depression or anxiety, speech and thought processing grossly intact  ASSESSMENT AND PLAN:  Discussed the following assessment and plan:  Flu-like symptoms  -we discussed possible serious and likely etiologies, options for evaluation and workup, limitations of telemedicine visit  vs in person visit, treatment, treatment risks and precautions. Pt is agreeable to treatment via telemedicine at this moment. Discussed possible flu, VURI (there are so many viral resp illnesses in the community currently) vs other. Discussed options for evaluation and management of each. She has had 2 negative covid tests and had covid twice this year. She has opted to retest for covid in 2 days and do vv  for antiviral if positive. She wants to try tamiflu empirically for possible flu - discussed risks. She requests Tessalon for cough. Albuterol q 4-6 hours as needed - she reports she has plenty at home that is not expired. Other symptomatic care measures summarized in patient instructions. Advised to seek prompt follow up or in person care if worsening, new symptoms arise, or if is not improving with treatment.    I discussed the assessment and treatment plan with the patient. The patient was provided an opportunity to ask questions and all were answered. The patient agreed with the plan and demonstrated an understanding of the instructions.     Terressa Koyanagi, DO

## 2021-05-28 ENCOUNTER — Other Ambulatory Visit: Payer: Self-pay | Admitting: Internal Medicine

## 2021-06-14 ENCOUNTER — Other Ambulatory Visit: Payer: Self-pay | Admitting: Internal Medicine

## 2021-08-08 ENCOUNTER — Other Ambulatory Visit: Payer: Self-pay | Admitting: Internal Medicine

## 2021-08-08 NOTE — Telephone Encounter (Signed)
Please refill as per office routine med refill policy (all routine meds to be refilled for 3 mo or monthly (per pt preference) up to one year from last visit, then month to month grace period for 3 mo, then further med refills will have to be denied) ? ?

## 2021-08-09 ENCOUNTER — Other Ambulatory Visit: Payer: Self-pay | Admitting: Internal Medicine

## 2021-08-18 ENCOUNTER — Other Ambulatory Visit: Payer: Self-pay | Admitting: Internal Medicine

## 2021-08-18 NOTE — Telephone Encounter (Signed)
Ambien refilled for 1 mo (total 15 tabs) ? ?Pt last seen jan 2022 ? ?Please let pt know - due to refill policy this is last rx ? ?Please make ROV for further rx ?

## 2021-08-24 ENCOUNTER — Telehealth (INDEPENDENT_AMBULATORY_CARE_PROVIDER_SITE_OTHER): Payer: BC Managed Care – PPO | Admitting: Internal Medicine

## 2021-08-24 DIAGNOSIS — R062 Wheezing: Secondary | ICD-10-CM | POA: Diagnosis not present

## 2021-08-24 DIAGNOSIS — R051 Acute cough: Secondary | ICD-10-CM

## 2021-08-24 DIAGNOSIS — R739 Hyperglycemia, unspecified: Secondary | ICD-10-CM | POA: Diagnosis not present

## 2021-08-24 MED ORDER — ALBUTEROL SULFATE HFA 108 (90 BASE) MCG/ACT IN AERS: 2.0000 | INHALATION_SPRAY | Freq: Four times a day (QID) | RESPIRATORY_TRACT | 1 refills | Status: DC | PRN

## 2021-08-24 MED ORDER — PREDNISONE 10 MG PO TABS: ORAL_TABLET | ORAL | 0 refills | Status: DC

## 2021-08-24 MED ORDER — HYDROCODONE BIT-HOMATROP MBR 5-1.5 MG/5ML PO SOLN: 5.0000 mL | Freq: Four times a day (QID) | ORAL | 0 refills | Status: DC | PRN

## 2021-08-24 MED ORDER — DOXYCYCLINE HYCLATE 100 MG PO TABS: 100.0000 mg | ORAL_TABLET | Freq: Two times a day (BID) | ORAL | 0 refills | Status: DC

## 2021-08-24 NOTE — Patient Instructions (Signed)
Please take all new medication as prescribed 

## 2021-08-24 NOTE — Progress Notes (Signed)
Patient ID: Terri Fletcher, female   DOB: 05/11/1961, 61 y.o.   MRN: [id number] ? ?Virtual Visit via Video Note ? ?I connected with Shaianne Nucci on 08/24/21 at  8:20 AM EST by a video enabled telemedicine application and verified that I am speaking with the correct person using two identifiers. ? ?Location of all participants today ?Patient: at home ?Provider: at office ?  ?I discussed the limitations of evaluation and management by telemedicine and the availability of in person appointments. The patient expressed understanding and agreed to proceed. ? ?History of Present Illness: ?Here with acute onset mild to mod 4days ST, HA, general weakness and malaise, with prod cough greenish sputum, but Pt denies chest pain, increased sob or doe, wheezing, orthopnea, PND, increased LE swelling, palpitations, dizziness or syncope, except for 2 days onset mild wheezing and sob/doe.  COVID neg yesterday at home.  Overall has been sick off and on x 5 wks with the grandson visiting more often.   Pt denies polydipsia, polyuria, or new focal neuro s/s.  ?Past Medical History:  ?Diagnosis Date  ? ALLERGIC RHINITIS   ? ANXIETY, SITUATIONAL   ? ASTHMA   ? COMMON MIGRAINE   ? DIARRHEA, RECURRENT   ? Impaired glucose tolerance 09/25/2012  ? INSOMNIA, PERSISTENT   ? ?Past Surgical History:  ?Procedure Laterality Date  ? BREAST SURGERY  11/05/2012  ? excision left breast mass  ? CESAREAN SECTION  06/25/1982  ? CESAREAN SECTION  11/25/1978  ? ? reports that she has quit smoking. Her smoking use included cigarettes. She has never used smokeless tobacco. She reports current alcohol use. She reports that she does not use drugs. ?family history includes Cancer in her maternal grandmother and mother; Heart attack in her maternal grandfather. ?Allergies  ?Allergen Reactions  ? Haldol [Haloperidol Lactate] Other (See Comments)  ?  Lock Jaw  ? Cefuroxime Axetil   ?  REACTION: nausea  ? Citalopram Other (See Comments)  ?  dizzy  ? ?Current Outpatient  Medications on File Prior to Visit  ?Medication Sig Dispense Refill  ? benzonatate (TESSALON PERLES) 100 MG capsule 1-2 capsules up to twice daily as needed for cough 40 capsule 0  ? diazepam (VALIUM) 5 MG tablet TAKE 1/2 - 1 TAB BY MOUTH DAILY AS NEEDED 30 tablet 2  ? gabapentin (NEURONTIN) 300 MG capsule Take one pill on day one, one pill bid on day 2 and then one pill tid thereafter 90 capsule 2  ? naproxen (NAPROSYN) 500 MG tablet Take 1 tablet (500 mg total) by mouth 2 (two) times daily with a meal. (Patient taking differently: Take 500 mg by mouth.) 60 tablet 2  ? olmesartan (BENICAR) 20 MG tablet TAKE 1 TABLET BY MOUTH EVERY DAY 30 tablet 0  ? oseltamivir (TAMIFLU) 75 MG capsule Take 1 capsule (75 mg total) by mouth 2 (two) times daily. 10 capsule 0  ? zolpidem (AMBIEN) 10 MG tablet TAKE 1 TABLET BY MOUTH AT BEDTIME AS NEEDED FOR SLEEP 15 tablet 0  ? ?No current facility-administered medications on file prior to visit.  ?  ?Observations/Objective: ?Alert, NAD, appropriate mood and affect, resps normal, cn 2-12 intact, moves all 4s, no visible rash or swelling ?Lab Results  ?Component Value Date  ? WBC 6.6 08/22/2018  ? HGB 14.7 08/22/2018  ? HCT 43.5 08/22/2018  ? PLT 225.0 08/22/2018  ? GLUCOSE 97 08/22/2018  ? CHOL 180 08/22/2018  ? TRIG 129.0 08/22/2018  ? HDL 60.60 08/22/2018  ? LDLCALC  93 08/22/2018  ? ALT 15 08/22/2018  ? AST 12 08/22/2018  ? NA 141 08/22/2018  ? K 3.4 (L) 08/22/2018  ? CL 104 08/22/2018  ? CREATININE 0.78 08/22/2018  ? BUN 12 08/22/2018  ? CO2 29 08/22/2018  ? TSH 1.33 08/22/2018  ? HGBA1C 5.7 08/22/2018  ? ?Assessment and Plan: ?See notes ? ?Follow Up Instructions: ?See notes ?  ?I discussed the assessment and treatment plan with the patient. The patient was provided an opportunity to ask questions and all were answered. The patient agreed with the plan and demonstrated an understanding of the instructions. ?  ?The patient was advised to call back or seek an in-person evaluation if  the symptoms worsen or if the condition fails to improve as anticipated. ? ? ?Oliver Barre, MD ? ?

## 2021-08-29 ENCOUNTER — Encounter: Payer: Self-pay | Admitting: Internal Medicine

## 2021-08-29 DIAGNOSIS — R059 Cough, unspecified: Secondary | ICD-10-CM | POA: Insufficient documentation

## 2021-08-29 DIAGNOSIS — R062 Wheezing: Secondary | ICD-10-CM | POA: Insufficient documentation

## 2021-08-29 NOTE — Assessment & Plan Note (Signed)
Mild to mod, declines cxr, for prednisone, and inhaler prn,  to f/u any worsening symptoms or concerns ?

## 2021-08-29 NOTE — Assessment & Plan Note (Signed)
Mild to mod, c/w bornchitis vs pna, for antibx course, cough med prn, to f/u any worsening symptoms or concerns 

## 2021-08-29 NOTE — Assessment & Plan Note (Signed)
Lab Results  ?Component Value Date  ? HGBA1C 5.7 08/22/2018  ? ?Stable, pt to continue current medical treatment  - diet ? ?

## 2021-08-31 ENCOUNTER — Ambulatory Visit (INDEPENDENT_AMBULATORY_CARE_PROVIDER_SITE_OTHER): Payer: BC Managed Care – PPO | Admitting: Internal Medicine

## 2021-08-31 ENCOUNTER — Encounter: Payer: Self-pay | Admitting: Internal Medicine

## 2021-08-31 ENCOUNTER — Other Ambulatory Visit: Payer: Self-pay

## 2021-08-31 VITALS — BP 128/70 | HR 69 | Temp 98.2°F | Ht 72.0 in | Wt 224.0 lb

## 2021-08-31 DIAGNOSIS — R739 Hyperglycemia, unspecified: Secondary | ICD-10-CM

## 2021-08-31 DIAGNOSIS — Z0001 Encounter for general adult medical examination with abnormal findings: Secondary | ICD-10-CM

## 2021-08-31 DIAGNOSIS — I10 Essential (primary) hypertension: Secondary | ICD-10-CM | POA: Diagnosis not present

## 2021-08-31 DIAGNOSIS — E538 Deficiency of other specified B group vitamins: Secondary | ICD-10-CM | POA: Diagnosis not present

## 2021-08-31 DIAGNOSIS — Z1211 Encounter for screening for malignant neoplasm of colon: Secondary | ICD-10-CM

## 2021-08-31 DIAGNOSIS — E559 Vitamin D deficiency, unspecified: Secondary | ICD-10-CM

## 2021-08-31 DIAGNOSIS — F411 Generalized anxiety disorder: Secondary | ICD-10-CM | POA: Diagnosis not present

## 2021-08-31 LAB — BASIC METABOLIC PANEL
BUN: 19 mg/dL (ref 6–23)
CO2: 29 mEq/L (ref 19–32)
Calcium: 10.2 mg/dL (ref 8.4–10.5)
Chloride: 104 mEq/L (ref 96–112)
Creatinine, Ser: 0.88 mg/dL (ref 0.40–1.20)
GFR: 70.68 mL/min (ref >60.00)
Glucose, Bld: 125 mg/dL — ABNORMAL HIGH (ref 70–99)
Potassium: 4.1 mEq/L (ref 3.5–5.1)
Sodium: 140 mEq/L (ref 135–145)

## 2021-08-31 LAB — CBC WITH DIFFERENTIAL/PLATELET
Basophils Absolute: 0 10*3/uL (ref 0.0–0.1)
Basophils Relative: 0.3 % (ref 0.0–3.0)
Eosinophils Absolute: 0 10*3/uL (ref 0.0–0.7)
Eosinophils Relative: 0 % (ref 0.0–5.0)
HCT: 42.5 % (ref 36.0–46.0)
Hemoglobin: 14.1 g/dL (ref 12.0–15.0)
Lymphocytes Relative: 20.5 % (ref 12.0–46.0)
Lymphs Abs: 1.7 10*3/uL (ref 0.7–4.0)
MCHC: 33.2 g/dL (ref 30.0–36.0)
MCV: 93.3 fl (ref 78.0–100.0)
Monocytes Absolute: 0.4 10*3/uL (ref 0.1–1.0)
Monocytes Relative: 4.3 % (ref 3.0–12.0)
Neutro Abs: 6.1 10*3/uL (ref 1.4–7.7)
Neutrophils Relative %: 74.9 % (ref 43.0–77.0)
Platelets: 233 10*3/uL (ref 150.0–400.0)
RBC: 4.56 Mil/uL (ref 3.87–5.11)
RDW: 13.5 % (ref 11.5–15.5)
WBC: 8.2 10*3/uL (ref 4.0–10.5)

## 2021-08-31 LAB — LIPID PANEL
Cholesterol: 203 mg/dL — ABNORMAL HIGH (ref 0–200)
HDL: 64.4 mg/dL (ref >39.00)
LDL Cholesterol: 99 mg/dL (ref 0–99)
NonHDL: 139.01
Total CHOL/HDL Ratio: 3
Triglycerides: 199 mg/dL — ABNORMAL HIGH (ref 0.0–149.0)
VLDL: 39.8 mg/dL (ref 0.0–40.0)

## 2021-08-31 LAB — HEPATIC FUNCTION PANEL
ALT: 15 U/L (ref 0–35)
AST: 12 U/L (ref 0–37)
Albumin: 4.6 g/dL (ref 3.5–5.2)
Alkaline Phosphatase: 69 U/L (ref 39–117)
Bilirubin, Direct: 0.1 mg/dL (ref 0.0–0.3)
Total Bilirubin: 0.5 mg/dL (ref 0.2–1.2)
Total Protein: 6.9 g/dL (ref 6.0–8.3)

## 2021-08-31 LAB — URINALYSIS, ROUTINE W REFLEX MICROSCOPIC
Bilirubin Urine: NEGATIVE
Hgb urine dipstick: NEGATIVE
Ketones, ur: NEGATIVE
Leukocytes,Ua: NEGATIVE
Nitrite: NEGATIVE
RBC / HPF: NONE SEEN (ref 0–?)
Specific Gravity, Urine: 1.02 (ref 1.000–1.030)
Total Protein, Urine: NEGATIVE
Urine Glucose: NEGATIVE
Urobilinogen, UA: 0.2 (ref 0.0–1.0)
pH: 6.5 (ref 5.0–8.0)

## 2021-08-31 LAB — HEMOGLOBIN A1C: Hgb A1c MFr Bld: 6.1 % (ref 4.6–6.5)

## 2021-08-31 LAB — TSH: TSH: 0.52 u[IU]/mL (ref 0.35–5.50)

## 2021-08-31 LAB — VITAMIN B12: Vitamin B-12: 305 pg/mL (ref 211–911)

## 2021-08-31 LAB — VITAMIN D 25 HYDROXY (VIT D DEFICIENCY, FRACTURES): VITD: 29.23 ng/mL — ABNORMAL LOW (ref 30.00–100.00)

## 2021-08-31 MED ORDER — DIAZEPAM 5 MG PO TABS: ORAL_TABLET | ORAL | 2 refills | Status: DC

## 2021-08-31 MED ORDER — ALBUTEROL SULFATE HFA 108 (90 BASE) MCG/ACT IN AERS: 2.0000 | INHALATION_SPRAY | Freq: Four times a day (QID) | RESPIRATORY_TRACT | 2 refills | Status: DC | PRN

## 2021-08-31 MED ORDER — OLMESARTAN MEDOXOMIL 20 MG PO TABS: 20.0000 mg | ORAL_TABLET | Freq: Every day | ORAL | 3 refills | Status: DC

## 2021-08-31 MED ORDER — ZOLPIDEM TARTRATE 10 MG PO TABS: 10.0000 mg | ORAL_TABLET | Freq: Every evening | ORAL | 1 refills | Status: DC | PRN

## 2021-08-31 NOTE — Patient Instructions (Addendum)
You will be contacted regarding the referral for: cologuard  Please continue all other medications as before, and refills have been done if requested.  Please have the pharmacy call with any other refills you may need.  Please continue your efforts at being more active, low cholesterol diet, and weight control.  You are otherwise up to date with prevention measures today.  Please keep your appointments with your specialists as you may have planned  Please go to the LAB at the blood drawing area for the tests to be done  You will be contacted by phone if any changes need to be made immediately.  Otherwise, you will receive a letter about your results with an explanation, but please check with MyChart first.  Please remember to sign up for MyChart if you have not done so, as this will be important to you in the future with finding out test results, communicating by private email, and scheduling acute appointments online when needed.  Please make an Appointment to return for your 1 year visit, or sooner if needed 

## 2021-08-31 NOTE — Progress Notes (Signed)
Patient ID: Terri Fletcher, female   DOB: 27-Dec-1960, 61 y.o.   MRN: [id number] ? ? ? ?     Chief Complaint:: wellness exam and low vit d, htn, hyperglycemia, anxiety ? ?     HPI:  Terri Fletcher is a 61 y.o. female here for wellness exam; pt plans to call herself for pap GYN appt, declines colonscopy but will do cologuard, declines flu shot, shingrix, tdap, o/w up to date.  She has fear of needles but will do labs today ?         ?              Also Denies worsening depressive symptoms, suicidal ideation, or panic; has ongoing anxiety, not increased recently.   Pt denies chest pain, increased sob or doe, wheezing, orthopnea, PND, increased LE swelling, palpitations, dizziness or syncope.   Pt denies polydipsia, polyuria, or new focal neuro s/s.   Pt denies fever, wt loss, night sweats, loss of appetite, or other constitutional symptoms  Not taking Vit d.  No other new complaints ?  ?Wt Readings from Last 3 Encounters:  ?08/31/21 224 lb (101.6 kg)  ?07/20/20 220 lb (99.8 kg)  ?08/22/18 194 lb (88 kg)  ? ?BP Readings from Last 3 Encounters:  ?08/31/21 128/70  ?07/20/20 (!) 170/94  ?08/22/18 122/80  ? ?There is no immunization history on file for this patient. ?There are no preventive care reminders to display for this patient. ? ?  ? ?Past Medical History:  ?Diagnosis Date  ? ALLERGIC RHINITIS   ? ANXIETY, SITUATIONAL   ? ASTHMA   ? COMMON MIGRAINE   ? DIARRHEA, RECURRENT   ? Impaired glucose tolerance 09/25/2012  ? INSOMNIA, PERSISTENT   ? ?Past Surgical History:  ?Procedure Laterality Date  ? BREAST SURGERY  11/05/2012  ? excision left breast mass  ? CESAREAN SECTION  06/25/1982  ? CESAREAN SECTION  11/25/1978  ? ? reports that she has quit smoking. Her smoking use included cigarettes. She has never used smokeless tobacco. She reports current alcohol use. She reports that she does not use drugs. ?family history includes Cancer in her maternal grandmother and mother; Heart attack in her maternal grandfather. ?Allergies   ?Allergen Reactions  ? Haldol [Haloperidol Lactate] Other (See Comments)  ?  Lock Jaw  ? Cefuroxime Axetil   ?  REACTION: nausea  ? Citalopram Other (See Comments)  ?  dizzy  ? ?Current Outpatient Medications on File Prior to Visit  ?Medication Sig Dispense Refill  ? gabapentin (NEURONTIN) 300 MG capsule Take one pill on day one, one pill bid on day 2 and then one pill tid thereafter 90 capsule 2  ? naproxen (NAPROSYN) 500 MG tablet Take 1 tablet (500 mg total) by mouth 2 (two) times daily with a meal. (Patient taking differently: Take 500 mg by mouth.) 60 tablet 2  ? ?No current facility-administered medications on file prior to visit.  ? ?     ROS:  All others reviewed and negative. ? ?Objective  ? ?     PE:  BP 128/70 (BP Location: Left Arm, Patient Position: Sitting, Cuff Size: Large)   Pulse 69   Temp 98.2 ?F (36.8 ?C) (Oral)   Ht 6' (1.829 m)   Wt 224 lb (101.6 kg)   SpO2 95%   BMI 30.38 kg/m?  ? ?              Constitutional: Pt appears in NAD ?  HENT: Head: NCAT.  ?              Right Ear: External ear normal.   ?              Left Ear: External ear normal.  ?              Eyes: . Pupils are equal, round, and reactive to light. Conjunctivae and EOM are normal ?              Nose: without d/c or deformity ?              Neck: Neck supple. Gross normal ROM ?              Cardiovascular: Normal rate and regular rhythm.   ?              Pulmonary/Chest: Effort normal and breath sounds without rales or wheezing.  ?              Abd:  Soft, NT, ND, + BS, no organomegaly ?              Neurological: Pt is alert. At baseline orientation, motor grossly intact ?              Skin: Skin is warm. No rashes, no other new lesions, LE edema - none ?              Psychiatric: Pt behavior is normal without agitation but 2+ nervous ? ?Micro: none ? ?Cardiac tracings I have personally interpreted today:  none ? ?Pertinent Radiological findings (summarize): none  ? ?Lab Results  ?Component Value Date  ? WBC  8.2 08/31/2021  ? HGB 14.1 08/31/2021  ? HCT 42.5 08/31/2021  ? PLT 233.0 08/31/2021  ? GLUCOSE 125 (H) 08/31/2021  ? CHOL 203 (H) 08/31/2021  ? TRIG 199.0 (H) 08/31/2021  ? HDL 64.40 08/31/2021  ? LDLCALC 99 08/31/2021  ? ALT 15 08/31/2021  ? AST 12 08/31/2021  ? NA 140 08/31/2021  ? K 4.1 08/31/2021  ? CL 104 08/31/2021  ? CREATININE 0.88 08/31/2021  ? BUN 19 08/31/2021  ? CO2 29 08/31/2021  ? TSH 0.52 08/31/2021  ? HGBA1C 6.1 08/31/2021  ? ?Assessment/Plan:  ?Terri Fletcher is a 61 y.o. White or Caucasian [1] female with  has a past medical history of ALLERGIC RHINITIS, ANXIETY, SITUATIONAL, ASTHMA, COMMON MIGRAINE, DIARRHEA, RECURRENT, Impaired glucose tolerance (09/25/2012), and INSOMNIA, PERSISTENT. ? ?Encounter for well adult exam with abnormal findings ?Age and sex appropriate education and counseling updated with regular exercise and diet ?Referrals for preventative services - for cologuard, declines colonoscoyp, pt will call for pap/gyn appt and mammogram ?Immunizations addressed - declines tdap, covid booster, shingrix ?Smoking counseling  - none needed ?Evidence for depression or other mood disorder - chronic anxiety - stable ?Most recent labs reviewed. ?I have personally reviewed and have noted: ?1) the patient's medical and social history ?2) The patient's current medications and supplements ?3) The patient's height, weight, and BMI have been recorded in the chart ? ? ?Vitamin D deficiency ?Last vitamin D ?Lab Results  ?Component Value Date  ? VD25OH 29.23 (L) 08/31/2021  ? ?Low, to start oral replacement ? ? ?Hypertension ?BP Readings from Last 3 Encounters:  ?08/31/21 128/70  ?07/20/20 (!) 170/94  ?08/22/18 122/80  ? ?Stable, pt to continue medical treatment benicar ? ? ?Hyperglycemia ?Lab Results  ?Component Value Date  ? HGBA1C 6.1 08/31/2021  ? ?Stable, pt  to continue current medical treatment  - diet, wt control ? ? ?Anxiety state ?Chronic stable cont current med tx - valium prn ? ?Followup: Return  in about 1 year (around 09/01/2022). ? ?Oliver BarreJames Jaycey Gens, MD 09/04/2021 6:56 AM ?[facility] ?[facility] ?Internal Medicine ?

## 2021-09-01 ENCOUNTER — Encounter: Payer: Self-pay | Admitting: Internal Medicine

## 2021-09-04 ENCOUNTER — Encounter: Payer: Self-pay | Admitting: Internal Medicine

## 2021-09-04 NOTE — Assessment & Plan Note (Signed)
Age and sex appropriate education and counseling updated with regular exercise and diet ?Referrals for preventative services - for cologuard, declines colonoscoyp, pt will call for pap/gyn appt and mammogram ?Immunizations addressed - declines tdap, covid booster, shingrix ?Smoking counseling  - none needed ?Evidence for depression or other mood disorder - chronic anxiety - stable ?Most recent labs reviewed. ?I have personally reviewed and have noted: ?1) the patient's medical and social history ?2) The patient's current medications and supplements ?3) The patient's height, weight, and BMI have been recorded in the chart ? ?

## 2021-09-04 NOTE — Assessment & Plan Note (Signed)
Lab Results  Component Value Date   HGBA1C 6.1 08/31/2021   Stable, pt to continue current medical treatment  - diet, wt control  

## 2021-09-04 NOTE — Assessment & Plan Note (Signed)
Last vitamin D Lab Results  Component Value Date   VD25OH 29.23 (L) 08/31/2021   Low, to start oral replacement  

## 2021-09-04 NOTE — Assessment & Plan Note (Addendum)
Chronic stable cont current med tx - valium prn ?

## 2021-09-04 NOTE — Assessment & Plan Note (Signed)
BP Readings from Last 3 Encounters:  ?08/31/21 128/70  ?07/20/20 (!) 170/94  ?08/22/18 122/80  ? ?Stable, pt to continue medical treatment benicar ? ?

## 2021-09-08 DIAGNOSIS — Z1211 Encounter for screening for malignant neoplasm of colon: Secondary | ICD-10-CM | POA: Diagnosis not present

## 2021-09-15 LAB — COLOGUARD: COLOGUARD: NEGATIVE

## 2021-11-02 ENCOUNTER — Encounter: Payer: Self-pay | Admitting: Internal Medicine

## 2021-11-03 MED ORDER — PREDNISONE 10 MG PO TABS: ORAL_TABLET | ORAL | 0 refills | Status: DC

## 2021-11-29 ENCOUNTER — Other Ambulatory Visit: Payer: Self-pay | Admitting: Physician Assistant

## 2022-02-01 ENCOUNTER — Telehealth: Payer: Self-pay

## 2022-02-14 ENCOUNTER — Emergency Department (HOSPITAL_BASED_OUTPATIENT_CLINIC_OR_DEPARTMENT_OTHER): Payer: BC Managed Care – PPO

## 2022-02-14 ENCOUNTER — Emergency Department (HOSPITAL_BASED_OUTPATIENT_CLINIC_OR_DEPARTMENT_OTHER)
Admission: EM | Admit: 2022-02-14 | Discharge: 2022-02-14 | Discharge status: Against Medical Advice | Payer: BC Managed Care – PPO | Attending: Emergency Medicine | Admitting: Emergency Medicine

## 2022-02-14 ENCOUNTER — Other Ambulatory Visit: Payer: Self-pay

## 2022-02-14 ENCOUNTER — Encounter (HOSPITAL_BASED_OUTPATIENT_CLINIC_OR_DEPARTMENT_OTHER): Payer: Self-pay

## 2022-02-14 DIAGNOSIS — M19071 Primary osteoarthritis, right ankle and foot: Secondary | ICD-10-CM | POA: Diagnosis not present

## 2022-02-14 DIAGNOSIS — M25571 Pain in right ankle and joints of right foot: Secondary | ICD-10-CM | POA: Insufficient documentation

## 2022-02-14 DIAGNOSIS — Z5321 Procedure and treatment not carried out due to patient leaving prior to being seen by health care provider: Secondary | ICD-10-CM | POA: Insufficient documentation

## 2022-02-14 DIAGNOSIS — M7731 Calcaneal spur, right foot: Secondary | ICD-10-CM | POA: Diagnosis not present

## 2022-02-14 DIAGNOSIS — M7989 Other specified soft tissue disorders: Secondary | ICD-10-CM | POA: Diagnosis not present

## 2022-02-14 HISTORY — DX: Essential (primary) hypertension: I10

## 2022-02-14 HISTORY — DX: Stress fracture, unspecified site, initial encounter for fracture: M84.30XA

## 2022-02-14 NOTE — ED Triage Notes (Signed)
Pt reports waking up yesterday morning with right ankle stiffness. Today ankle has started to swell and become painful. No known injury

## 2022-02-16 NOTE — Telephone Encounter (Signed)
Error

## 2022-02-17 ENCOUNTER — Ambulatory Visit: Payer: BC Managed Care – PPO | Admitting: Orthopaedic Surgery

## 2022-02-17 DIAGNOSIS — M25572 Pain in left ankle and joints of left foot: Secondary | ICD-10-CM | POA: Diagnosis not present

## 2022-02-17 MED ORDER — PREDNISONE 10 MG (21) PO TBPK: ORAL_TABLET | ORAL | 0 refills | Status: DC

## 2022-02-17 NOTE — Progress Notes (Signed)
Office Visit Note   Patient: Terri Fletcher           Date of Birth: 26-Jun-1960           MRN: [id number] Visit Date: 02/17/2022              Requested by: Corwin Levins, MD [address] [place],  [place] [address] PCP: Corwin Levins, MD   Assessment & Plan: Visit Diagnoses:  1. Pain in left ankle and joints of left foot     Plan: Impression is acute left ankle/foot pain.  The patient's symptoms seem inflammatory in nature.  I believe she is having some sort of osteoarthritis flareup versus gout and possible posterior tibial tendinitis.  I am not concerned for infectious process.  I discussed obtaining uric acid level as well as placing her on a steroid and providing her with a cam walker.  She does have a phobia of needles and is not interested in lab work today.  She will proceed with the steroid and cam walker.  If her symptoms have not improved over the next week on the steroid she will let us know.  Otherwise, wean out of the boot as tolerated and follow-up as needed.  Follow-Up Instructions: Return if symptoms worsen or fail to improve.   Orders:  No orders of the defined types were placed in this encounter.  Meds ordered this encounter  Medications   predniSONE (STERAPRED UNI-PAK 21 TAB) 10 MG (21) TBPK tablet    Sig: Take as directed    Dispense:  21 tablet    Refill:  0      Procedures: No procedures performed   Clinical Data: No additional findings.   Subjective: Chief Complaint  Patient presents with   Right Ankle - Pain    HPI patient is a pleasant 61 year old female who comes in today with right ankle pain and swelling since Sunday.  She woke up with the symptoms.  She denies any injury or recent change in activity.  She denies any fevers or chills or any other constitutional symptoms.  No history of gout.  She went to the ED Monday due to increased pain and stiffness where she had x-rays.  She was unable to stay to see the physician as she had to leave to  pick up her grandson.  Her symptoms have worsened over the past few days.  The pain and swelling she has is to the medial ankle.  She describes this as a constant throb worse with walking.  She has been taking Tylenol and using ice without significant relief.  She does note slight burning sensations.  Review of Systems as detailed in HPI.  All others reviewed and are negative.   Objective: Vital Signs: There were no vitals taken for this visit.  Physical Exam well-developed well-nourished female no acute distress.  Alert and oriented x3.  Ortho Exam examination of the left ankle reveals mild swelling to the medial aspect.  Moderate tenderness along the posterior tibial tendon as well as diffusely across the medial midfoot.  No erythema or signs of infection or cellulitis.  She has slight pain with range of motion of the ankle.  She is neurovascular intact distally.  Specialty Comments:  No specialty comments available.  Imaging: X-rays reviewed by me in canopy reveal osteophyte formation at the plantar fascia insertion of the heel.  She also has degenerative changes throughout the midfoot   PMFS History: Patient Active Problem List  Diagnosis Date Noted   Vitamin D deficiency 08/31/2021   Cough 08/29/2021   Wheezing 08/29/2021   Hypertension 07/20/2020   Acute asthmatic bronchitis 03/16/2020   Sinusitis 12/22/2019   Hyperglycemia 08/22/2018   Pain in left wrist 11/15/2017   Ganglion of left wrist 09/14/2017   Leg cramping 09/14/2017   Night sweats 09/14/2017   Weight loss 09/14/2017   Upper respiratory tract infection 04/20/2017   Left groin pain 10/19/2016   Abdominal pain, epigastric 03/19/2013   Left lumbar radiculopathy 02/07/2013   Elevated blood pressure reading without diagnosis of hypertension 02/07/2013   Left breast mass 12/06/2012   Hematochezia 10/09/2012   LLQ pain 09/25/2012   Frequency of urination 09/05/2012   Encounter for well adult exam with abnormal  findings 05/30/2011   Anxiety state 03/01/2010   COMMON MIGRAINE 03/01/2010   DIARRHEA, RECURRENT 03/01/2010   INSOMNIA, PERSISTENT 01/04/2008   Allergic rhinitis 05/21/2007   Asthma 03/04/2007   Past Medical History:  Diagnosis Date   ALLERGIC RHINITIS    ANXIETY, SITUATIONAL    ASTHMA    COMMON MIGRAINE    DIARRHEA, RECURRENT    Hypertension    Impaired glucose tolerance 09/25/2012   INSOMNIA, PERSISTENT    Stress fracture     Family History  Problem Relation Age of Onset   Cancer Mother        Breast   Cancer Maternal Grandmother    Heart attack Maternal Grandfather     Past Surgical History:  Procedure Laterality Date   BREAST SURGERY  11/05/2012   excision left breast mass   CESAREAN SECTION  06/25/1982   CESAREAN SECTION  11/25/1978   Social History   Occupational History   Not on file  Tobacco Use   Smoking status: Former    Types: Cigarettes   Smokeless tobacco: Never  Substance and Sexual Activity   Alcohol use: Yes    Alcohol/week: 0.0 standard drinks of alcohol    Comment: Occasional.   Drug use: No    Types: Marijuana    Comment: Occassional.   Sexual activity: Not on file

## 2022-02-24 ENCOUNTER — Encounter: Payer: Self-pay | Admitting: Orthopaedic Surgery

## 2022-02-24 NOTE — Telephone Encounter (Signed)
If she is not able to tolerate the boot, I would make sure she rests this ankle as much as possible.  I know she has a fear of needles, but at this point I would also recommend a cortisone injection in the ankle.

## 2022-02-24 NOTE — Telephone Encounter (Signed)
See message.

## 2022-02-25 ENCOUNTER — Other Ambulatory Visit: Payer: Self-pay | Admitting: Physician Assistant

## 2022-02-25 MED ORDER — DICLOFENAC SODIUM 75 MG PO TBEC: 75.0000 mg | DELAYED_RELEASE_TABLET | Freq: Two times a day (BID) | ORAL | 2 refills | Status: DC | PRN

## 2022-02-25 NOTE — Telephone Encounter (Signed)
I sent in voltaren to try as well.  She is always welcome to come in and see dr. Roda Shutters if she would like

## 2022-02-28 ENCOUNTER — Encounter: Payer: Self-pay | Admitting: Internal Medicine

## 2022-03-02 ENCOUNTER — Other Ambulatory Visit: Payer: Self-pay | Admitting: Internal Medicine

## 2022-03-02 NOTE — Telephone Encounter (Signed)
LOV 08/31/21

## 2022-03-09 ENCOUNTER — Ambulatory Visit: Payer: BC Managed Care – PPO | Admitting: Internal Medicine

## 2022-03-09 VITALS — BP 142/88 | HR 70 | Temp 97.8°F | Ht 72.0 in | Wt 214.0 lb

## 2022-03-09 DIAGNOSIS — E559 Vitamin D deficiency, unspecified: Secondary | ICD-10-CM | POA: Diagnosis not present

## 2022-03-09 DIAGNOSIS — R739 Hyperglycemia, unspecified: Secondary | ICD-10-CM | POA: Diagnosis not present

## 2022-03-09 DIAGNOSIS — E669 Obesity, unspecified: Secondary | ICD-10-CM | POA: Diagnosis not present

## 2022-03-09 DIAGNOSIS — I10 Essential (primary) hypertension: Secondary | ICD-10-CM | POA: Diagnosis not present

## 2022-03-09 NOTE — Progress Notes (Signed)
Patient ID: Terri Fletcher, female   DOB: July 27, 1960, 61 y.o.   MRN: [id number]        Chief Complaint: follow up HTN, low vit d, obesity       HPI:  Terri Fletcher is a 61 y.o. female here overall doing ok, Pt denies chest pain, increased sob or doe, wheezing, orthopnea, PND, increased LE swelling, palpitations, or syncope.   Pt denies polydipsia, polyuria, or new focal neuro s/s.    Pt denies fever, night sweats, loss of appetite, or other constitutional symptoms     BP has been < 110/90 with recent wt loss, and symptoms of weakness, dizziness.  Drinks plenty of fluids. Lost 10 lbs with better diet.  Not taking Vit D.    Wt Readings from Last 3 Encounters:  03/09/22 214 lb (97.1 kg)  02/14/22 220 lb (99.8 kg)  08/31/21 224 lb (101.6 kg)   BP Readings from Last 3 Encounters:  03/09/22 (!) 142/88  02/14/22 (!) 163/95  08/31/21 128/70         Past Medical History:  Diagnosis Date   ALLERGIC RHINITIS    ANXIETY, SITUATIONAL    ASTHMA    COMMON MIGRAINE    DIARRHEA, RECURRENT    Hypertension    Impaired glucose tolerance 09/25/2012   INSOMNIA, PERSISTENT    Stress fracture    Past Surgical History:  Procedure Laterality Date   BREAST SURGERY  11/05/2012   excision left breast mass   CESAREAN SECTION  06/25/1982   CESAREAN SECTION  11/25/1978    reports that she has quit smoking. Her smoking use included cigarettes. She has never used smokeless tobacco. She reports current alcohol use. She reports that she does not use drugs. family history includes Cancer in her maternal grandmother and mother; Heart attack in her maternal grandfather. Allergies  Allergen Reactions   Haldol [Haloperidol Lactate] Other (See Comments)    Lock Jaw   Cefuroxime Axetil     REACTION: nausea   Citalopram Other (See Comments)    dizzy   Current Outpatient Medications on File Prior to Visit  Medication Sig Dispense Refill   albuterol (VENTOLIN HFA) 108 (90 Base) MCG/ACT inhaler Inhale 2 puffs into the  lungs every 6 (six) hours as needed for wheezing or shortness of breath. 8 g 2   Cholecalciferol (D3 2000 PO) Take 2,000 Int'l Units/day by mouth.     co-enzyme Q-10 30 MG capsule Take 100 mg by mouth 3 (three) times daily.     gabapentin (NEURONTIN) 300 MG capsule Take one pill on day one, one pill bid on day 2 and then one pill tid thereafter 90 capsule 2   Ginkgo Biloba 40 MG TABS Take 120 mg by mouth.     naproxen (NAPROSYN) 500 MG tablet Take 1 tablet (500 mg total) by mouth 2 (two) times daily with a meal. (Patient taking differently: Take 500 mg by mouth.) 60 tablet 2   OMEGA-3 KRILL OIL PO Take by mouth.     zolpidem (AMBIEN) 10 MG tablet TAKE 1 TABLET (10 MG TOTAL) BY MOUTH AT BEDTIME AS NEEDED. FOR SLEEP 90 tablet 1   No current facility-administered medications on file prior to visit.        ROS:  All others reviewed and negative.  Objective        PE:  BP (!) 142/88 (BP Location: Left Arm, Patient Position: Sitting, Cuff Size: Large)   Pulse 70   Temp 97.8 F (36.6 C) (Oral)  Ht 6' (1.829 m)   Wt 214 lb (97.1 kg)   SpO2 96%   BMI 29.02 kg/m                 Constitutional: Pt appears in NAD               HENT: Head: NCAT.                Right Ear: External ear normal.                 Left Ear: External ear normal.                Eyes: . Pupils are equal, round, and reactive to light. Conjunctivae and EOM are normal               Nose: without d/c or deformity               Neck: Neck supple. Gross normal ROM               Cardiovascular: Normal rate and regular rhythm.                 Pulmonary/Chest: Effort normal and breath sounds without rales or wheezing.                Abd:  Soft, NT, ND, + BS, no organomegaly               Neurological: Pt is alert. At baseline orientation, motor grossly intact               Skin: Skin is warm. No rashes, no other new lesions, LE edema - none               Psychiatric: Pt behavior is normal without agitation   Micro:  none  Cardiac tracings I have personally interpreted today:  none  Pertinent Radiological findings (summarize): none   Lab Results  Component Value Date   WBC 8.2 08/31/2021   HGB 14.1 08/31/2021   HCT 42.5 08/31/2021   PLT 233.0 08/31/2021   GLUCOSE 125 (H) 08/31/2021   CHOL 203 (H) 08/31/2021   TRIG 199.0 (H) 08/31/2021   HDL 64.40 08/31/2021   LDLCALC 99 08/31/2021   ALT 15 08/31/2021   AST 12 08/31/2021   NA 140 08/31/2021   K 4.1 08/31/2021   CL 104 08/31/2021   CREATININE 0.88 08/31/2021   BUN 19 08/31/2021   CO2 29 08/31/2021   TSH 0.52 08/31/2021   HGBA1C 6.1 08/31/2021   Assessment/Plan:  Terri Fletcher is a 61 y.o. White or Caucasian [1] female with  has a past medical history of ALLERGIC RHINITIS, ANXIETY, SITUATIONAL, ASTHMA, COMMON MIGRAINE, DIARRHEA, RECURRENT, Hypertension, Impaired glucose tolerance (09/25/2012), INSOMNIA, PERSISTENT, and Stress fracture.  Vitamin D deficiency Last vitamin D Lab Results  Component Value Date   VD25OH 29.23 (L) 08/31/2021   Low, reminded to start oral replacement   Hypertension BP Readings from Last 3 Encounters:  03/09/22 (!) 142/88  02/14/22 (!) 163/95  08/31/21 128/70   Despite increased today, appears to be overcontrolled at home with symptoms pt to stop benicar, check BP at home for 1 wk and let us know results; consider restarting less effective med such as losartan 50 mg qd   Hyperglycemia Lab Results  Component Value Date   HGBA1C 6.1 08/31/2021   Stable, pt to continue current medical treatment  - diet, wt control, excercise   Obesity (BMI  30-39.9) Now improved with better diet, pt encouraged to continue  Followup: Return in about 6 months (around 09/07/2022).  Oliver Barre, MD 03/12/2022 1:27 PM [facility] - [facility] Internal Medicine

## 2022-03-09 NOTE — Assessment & Plan Note (Signed)
Last vitamin D Lab Results  Component Value Date   VD25OH 29.23 (L) 08/31/2021   Low, reminded to start oral replacement

## 2022-03-11 ENCOUNTER — Other Ambulatory Visit: Payer: Self-pay | Admitting: Internal Medicine

## 2022-03-12 ENCOUNTER — Encounter: Payer: Self-pay | Admitting: Internal Medicine

## 2022-03-12 DIAGNOSIS — E669 Obesity, unspecified: Secondary | ICD-10-CM | POA: Insufficient documentation

## 2022-03-12 NOTE — Patient Instructions (Signed)
Please take OTC Vitamin D3 at 2000 units per day, indefinitely  Ok to stop the benicar, and call in 1 wk with blood pressures  Please continue all other medications as before, and refills have been done if requested.  Please have the pharmacy call with any other refills you may need.  Please continue your efforts at being more active, low cholesterol diet, and weight control  Please keep your appointments with your specialists as you may have planned

## 2022-03-12 NOTE — Assessment & Plan Note (Signed)
BP Readings from Last 3 Encounters:  03/09/22 (!) 142/88  02/14/22 (!) 163/95  08/31/21 128/70   Despite increased today, appears to be overcontrolled at home with symptoms pt to stop benicar, check BP at home for 1 wk and let us know results; consider restarting less effective med such as losartan 50 mg qd

## 2022-03-12 NOTE — Assessment & Plan Note (Signed)
Lab Results  Component Value Date   HGBA1C 6.1 08/31/2021   Stable, pt to continue current medical treatment  - diet, wt control, excercise

## 2022-03-12 NOTE — Assessment & Plan Note (Signed)
Now improved with better diet, pt encouraged to continue

## 2022-03-20 ENCOUNTER — Encounter: Payer: Self-pay | Admitting: Internal Medicine

## 2022-03-22 NOTE — Telephone Encounter (Signed)
FYI, not sure how I would document these readings

## 2022-03-30 MED ORDER — LOSARTAN POTASSIUM 50 MG PO TABS: 50.0000 mg | ORAL_TABLET | Freq: Every day | ORAL | 3 refills | Status: DC

## 2022-05-25 ENCOUNTER — Ambulatory Visit: Payer: BC Managed Care – PPO | Admitting: Internal Medicine

## 2022-06-03 ENCOUNTER — Other Ambulatory Visit: Payer: Self-pay | Admitting: Internal Medicine

## 2022-06-07 ENCOUNTER — Encounter: Payer: Self-pay | Admitting: Orthopaedic Surgery

## 2022-06-07 ENCOUNTER — Ambulatory Visit (INDEPENDENT_AMBULATORY_CARE_PROVIDER_SITE_OTHER): Payer: BC Managed Care – PPO

## 2022-06-07 ENCOUNTER — Ambulatory Visit: Payer: BC Managed Care – PPO | Admitting: Orthopaedic Surgery

## 2022-06-07 DIAGNOSIS — M25532 Pain in left wrist: Secondary | ICD-10-CM

## 2022-06-07 NOTE — Progress Notes (Signed)
Office Visit Note   Patient: Terri Fletcher           Date of Birth: Nov 18, 1960           MRN: [id number] Visit Date: 06/07/2022              Requested by: Corwin Levins, MD [address] [place],  [place] [address] PCP: Corwin Levins, MD   Assessment & Plan: Visit Diagnoses:  1. Pain in left wrist     Plan: Impression is left wrist dorsal ganglion cyst.  Disease process explained.  The numbness and tingling in the ring fingers may be due to her occupation and some irritation to the ulnar nerve.  Treatment options were reviewed for the cyst.  For now she would like to just monitor it.  Follow-up as needed.  Follow-Up Instructions: No follow-ups on file.   Orders:  Orders Placed This Encounter  Procedures   XR Wrist Complete Left   No orders of the defined types were placed in this encounter.     Procedures: No procedures performed   Clinical Data: No additional findings.   Subjective: Chief Complaint  Patient presents with   Left Wrist - Pain    HPI Terri Fletcher is a 61 year old female comes in for evaluation of mass on the dorsal aspect of the left wrist.  This appeared about 2 to 3 months ago.  Denies any injuries.  States that her ring and pinky finger is occasionally go numb.  Review of Systems  Constitutional: Negative.   HENT: Negative.    Eyes: Negative.   Respiratory: Negative.    Cardiovascular: Negative.   Endocrine: Negative.   Musculoskeletal: Negative.   Neurological: Negative.   Hematological: Negative.   Psychiatric/Behavioral: Negative.    All other systems reviewed and are negative.    Objective: Vital Signs: There were no vitals taken for this visit.  Physical Exam Vitals and nursing note reviewed.  Constitutional:      Appearance: She is well-developed.  HENT:     Head: Atraumatic.     Nose: Nose normal.  Eyes:     Extraocular Movements: Extraocular movements intact.  Cardiovascular:     Pulses: Normal pulses.  Pulmonary:      Effort: Pulmonary effort is normal.  Abdominal:     Palpations: Abdomen is soft.  Musculoskeletal:     Cervical back: Neck supple.  Skin:    General: Skin is warm.     Capillary Refill: Capillary refill takes less than 2 seconds.  Neurological:     Mental Status: She is alert. Mental status is at baseline.  Psychiatric:        Behavior: Behavior normal.        Thought Content: Thought content normal.        Judgment: Judgment normal.     Ortho Exam Examination of the left wrist shows a 1 cm circular mass that is firm and semimobile consistent with a dorsal ganglion cyst. Specialty Comments:  No specialty comments available.  Imaging: No results found.   PMFS History: Patient Active Problem List   Diagnosis Date Noted   Obesity (BMI 30-39.9) 03/12/2022   Vitamin D deficiency 08/31/2021   Cough 08/29/2021   Wheezing 08/29/2021   Hypertension 07/20/2020   Acute asthmatic bronchitis 03/16/2020   Sinusitis 12/22/2019   Hyperglycemia 08/22/2018   Pain in left wrist 11/15/2017   Ganglion of left wrist 09/14/2017   Leg cramping 09/14/2017   Night sweats 09/14/2017  Weight loss 09/14/2017   Upper respiratory tract infection 04/20/2017   Left groin pain 10/19/2016   Abdominal pain, epigastric 03/19/2013   Left lumbar radiculopathy 02/07/2013   Elevated blood pressure reading without diagnosis of hypertension 02/07/2013   Left breast mass 12/06/2012   Hematochezia 10/09/2012   LLQ pain 09/25/2012   Frequency of urination 09/05/2012   Encounter for well adult exam with abnormal findings 05/30/2011   Anxiety state 03/01/2010   COMMON MIGRAINE 03/01/2010   DIARRHEA, RECURRENT 03/01/2010   INSOMNIA, PERSISTENT 01/04/2008   Allergic rhinitis 05/21/2007   Asthma 03/04/2007   Past Medical History:  Diagnosis Date   ALLERGIC RHINITIS    ANXIETY, SITUATIONAL    ASTHMA    COMMON MIGRAINE    DIARRHEA, RECURRENT    Hypertension    Impaired glucose tolerance 09/25/2012    INSOMNIA, PERSISTENT    Stress fracture     Family History  Problem Relation Age of Onset   Cancer Mother        Breast   Cancer Maternal Grandmother    Heart attack Maternal Grandfather     Past Surgical History:  Procedure Laterality Date   BREAST SURGERY  11/05/2012   excision left breast mass   CESAREAN SECTION  06/25/1982   CESAREAN SECTION  11/25/1978   Social History   Occupational History   Not on file  Tobacco Use   Smoking status: Former    Types: Cigarettes   Smokeless tobacco: Never  Substance and Sexual Activity   Alcohol use: Yes    Alcohol/week: 0.0 standard drinks of alcohol    Comment: Occasional.   Drug use: No    Types: Marijuana    Comment: Occassional.   Sexual activity: Not on file

## 2022-08-14 ENCOUNTER — Other Ambulatory Visit: Payer: Self-pay | Admitting: Internal Medicine

## 2022-08-24 ENCOUNTER — Other Ambulatory Visit: Payer: Self-pay | Admitting: Internal Medicine

## 2022-09-05 ENCOUNTER — Ambulatory Visit (INDEPENDENT_AMBULATORY_CARE_PROVIDER_SITE_OTHER): Payer: BC Managed Care – PPO | Admitting: Internal Medicine

## 2022-09-05 ENCOUNTER — Encounter: Payer: Self-pay | Admitting: Internal Medicine

## 2022-09-05 VITALS — BP 124/76 | HR 64 | Temp 98.7°F | Ht 72.0 in | Wt 216.0 lb

## 2022-09-05 DIAGNOSIS — E559 Vitamin D deficiency, unspecified: Secondary | ICD-10-CM

## 2022-09-05 DIAGNOSIS — I10 Essential (primary) hypertension: Secondary | ICD-10-CM | POA: Diagnosis not present

## 2022-09-05 DIAGNOSIS — E538 Deficiency of other specified B group vitamins: Secondary | ICD-10-CM

## 2022-09-05 DIAGNOSIS — R739 Hyperglycemia, unspecified: Secondary | ICD-10-CM | POA: Diagnosis not present

## 2022-09-05 DIAGNOSIS — Z0001 Encounter for general adult medical examination with abnormal findings: Secondary | ICD-10-CM

## 2022-09-05 DIAGNOSIS — R251 Tremor, unspecified: Secondary | ICD-10-CM | POA: Insufficient documentation

## 2022-09-05 LAB — HEPATIC FUNCTION PANEL
ALT: 18 U/L (ref 0–35)
AST: 17 U/L (ref 0–37)
Albumin: 4.5 g/dL (ref 3.5–5.2)
Alkaline Phosphatase: 77 U/L (ref 39–117)
Bilirubin, Direct: 0.1 mg/dL (ref 0.0–0.3)
Total Bilirubin: 0.5 mg/dL (ref 0.2–1.2)
Total Protein: 7.1 g/dL (ref 6.0–8.3)

## 2022-09-05 LAB — CBC WITH DIFFERENTIAL/PLATELET
Basophils Absolute: 0 10*3/uL (ref 0.0–0.1)
Basophils Relative: 0.4 % (ref 0.0–3.0)
Eosinophils Absolute: 0 10*3/uL (ref 0.0–0.7)
Eosinophils Relative: 0.2 % (ref 0.0–5.0)
HCT: 44 % (ref 36.0–46.0)
Hemoglobin: 14.7 g/dL (ref 12.0–15.0)
Lymphocytes Relative: 32.3 % (ref 12.0–46.0)
Lymphs Abs: 2 10*3/uL (ref 0.7–4.0)
MCHC: 33.3 g/dL (ref 30.0–36.0)
MCV: 93.7 fl (ref 78.0–100.0)
Monocytes Absolute: 0.4 10*3/uL (ref 0.1–1.0)
Monocytes Relative: 6.5 % (ref 3.0–12.0)
Neutro Abs: 3.7 10*3/uL (ref 1.4–7.7)
Neutrophils Relative %: 60.6 % (ref 43.0–77.0)
Platelets: 234 10*3/uL (ref 150.0–400.0)
RBC: 4.7 Mil/uL (ref 3.87–5.11)
RDW: 13.8 % (ref 11.5–15.5)
WBC: 6.1 10*3/uL (ref 4.0–10.5)

## 2022-09-05 LAB — MICROALBUMIN / CREATININE URINE RATIO
Creatinine,U: 82.8 mg/dL
Microalb Creat Ratio: 0.8 mg/g (ref 0.0–30.0)
Microalb, Ur: <0.7 mg/dL (ref 0.0–1.9)

## 2022-09-05 LAB — BASIC METABOLIC PANEL
BUN: 15 mg/dL (ref 6–23)
CO2: 27 mEq/L (ref 19–32)
Calcium: 10 mg/dL (ref 8.4–10.5)
Chloride: 102 mEq/L (ref 96–112)
Creatinine, Ser: 0.81 mg/dL (ref 0.40–1.20)
GFR: 77.52 mL/min (ref >60.00)
Glucose, Bld: 110 mg/dL — ABNORMAL HIGH (ref 70–99)
Potassium: 4.2 mEq/L (ref 3.5–5.1)
Sodium: 139 mEq/L (ref 135–145)

## 2022-09-05 LAB — URINALYSIS, ROUTINE W REFLEX MICROSCOPIC
Bilirubin Urine: NEGATIVE
Ketones, ur: NEGATIVE
Leukocytes,Ua: NEGATIVE
Nitrite: NEGATIVE
Specific Gravity, Urine: 1.02 (ref 1.000–1.030)
Total Protein, Urine: NEGATIVE
Urine Glucose: NEGATIVE
Urobilinogen, UA: 0.2 (ref 0.0–1.0)
pH: 6 (ref 5.0–8.0)

## 2022-09-05 LAB — LIPID PANEL
Cholesterol: 228 mg/dL — ABNORMAL HIGH (ref 0–200)
HDL: 67.5 mg/dL (ref >39.00)
LDL Cholesterol: 131 mg/dL — ABNORMAL HIGH (ref 0–99)
NonHDL: 160.02
Total CHOL/HDL Ratio: 3
Triglycerides: 146 mg/dL (ref 0.0–149.0)
VLDL: 29.2 mg/dL (ref 0.0–40.0)

## 2022-09-05 LAB — VITAMIN B12: Vitamin B-12: 212 pg/mL (ref 211–911)

## 2022-09-05 LAB — TSH: TSH: 1.61 u[IU]/mL (ref 0.35–5.50)

## 2022-09-05 LAB — HEMOGLOBIN A1C: Hgb A1c MFr Bld: 6 % (ref 4.6–6.5)

## 2022-09-05 LAB — VITAMIN D 25 HYDROXY (VIT D DEFICIENCY, FRACTURES): VITD: 33.02 ng/mL (ref 30.00–100.00)

## 2022-09-05 NOTE — Assessment & Plan Note (Signed)
C/w right hand intentional tremor, very mild, ok to follow

## 2022-09-05 NOTE — Progress Notes (Signed)
Patient ID: Terri Fletcher, female   DOB: 1961-04-18, 62 y.o.   MRN: [id number]         Chief Complaint:: wellness exam and right hand tremor, hyperglycemia, htn, low vit d       HPI:  Terri Fletcher is a 62 y.o. female here for wellness exam; pt declines GYN referral with pap and mamogram, decliens flu shot, shignrix o/w up to date                        Also Pt denies chest pain, increased sob or doe, wheezing, orthopnea, PND, increased LE swelling, palpitations, dizziness or syncope.   Pt denies polydipsia, polyuria, or new focal neuro s/s, except for new mild intermittent right hand tremor when trying to use it like holding a glass.   Pt denies fever, wt loss, night sweats, loss of appetite, or other constitutional symptoms     Overal lost wt x 6 mo despite ongoing family stress, walkind daily and gym for exercise.   Wt Readings from Last 3 Encounters:  09/05/22 216 lb (98 kg)  03/09/22 214 lb (97.1 kg)  02/14/22 220 lb (99.8 kg)   BP Readings from Last 3 Encounters:  09/05/22 124/76  03/09/22 (!) 142/88  02/14/22 (!) 163/95   There is no immunization history on file for this patient. There are no preventive care reminders to display for this patient.     Past Medical History:  Diagnosis Date   ALLERGIC RHINITIS    ANXIETY, SITUATIONAL    ASTHMA    COMMON MIGRAINE    DIARRHEA, RECURRENT    Hypertension    Impaired glucose tolerance 09/25/2012   INSOMNIA, PERSISTENT    Stress fracture    Past Surgical History:  Procedure Laterality Date   BREAST SURGERY  11/05/2012   excision left breast mass   CESAREAN SECTION  06/25/1982   CESAREAN SECTION  11/25/1978    reports that she has quit smoking. Her smoking use included cigarettes. She has never used smokeless tobacco. She reports current alcohol use. She reports that she does not use drugs. family history includes Cancer in her maternal grandmother and mother; Heart attack in her maternal grandfather. Allergies  Allergen  Reactions   Haldol [Haloperidol Lactate] Other (See Comments)    Lock Jaw   Cefuroxime Axetil     REACTION: nausea   Citalopram Other (See Comments)    dizzy   Current Outpatient Medications on File Prior to Visit  Medication Sig Dispense Refill   albuterol (VENTOLIN HFA) 108 (90 Base) MCG/ACT inhaler Inhale 2 puffs into the lungs every 6 (six) hours as needed for wheezing or shortness of breath. 8 g 2   Cholecalciferol (D3 2000 PO) Take 2,000 Int'l Units/day by mouth.     co-enzyme Q-10 30 MG capsule Take 100 mg by mouth 3 (three) times daily.     diazepam (VALIUM) 5 MG tablet TAKE 1 TABLET BY MOUTH EVERY DAY AS NEEDED 30 tablet 2   gabapentin (NEURONTIN) 300 MG capsule Take one pill on day one, one pill bid on day 2 and then one pill tid thereafter 90 capsule 2   Ginkgo Biloba 40 MG TABS Take 120 mg by mouth.     losartan (COZAAR) 50 MG tablet Take 1 tablet (50 mg total) by mouth daily. 90 tablet 3   naproxen (NAPROSYN) 500 MG tablet Take 1 tablet (500 mg total) by mouth 2 (two) times daily with a meal. (Patient  taking differently: Take 500 mg by mouth.) 60 tablet 2   OMEGA-3 KRILL OIL PO Take by mouth.     zolpidem (AMBIEN) 10 MG tablet TAKE 1 TABLET (10 MG TOTAL) BY MOUTH AT BEDTIME AS NEEDED. FOR SLEEP 90 tablet 1   No current facility-administered medications on file prior to visit.        ROS:  All others reviewed and negative.  Objective        PE:  BP 124/76 (BP Location: Right Arm, Patient Position: Sitting, Cuff Size: Normal)   Pulse 64   Temp 98.7 F (37.1 C) (Oral)   Ht 6' (1.829 m)   Wt 216 lb (98 kg)   SpO2 99%   BMI 29.29 kg/m                 Constitutional: Pt appears in NAD               HENT: Head: NCAT.                Right Ear: External ear normal.                 Left Ear: External ear normal.                Eyes: . Pupils are equal, round, and reactive to light. Conjunctivae and EOM are normal               Nose: without d/c or deformity                Neck: Neck supple. Gross normal ROM               Cardiovascular: Normal rate and regular rhythm.                 Pulmonary/Chest: Effort normal and breath sounds without rales or wheezing.                Abd:  Soft, NT, ND, + BS, no organomegaly               Neurological: Pt is alert. At baseline orientation, motor grossly intact, no tremor noted, cn 2-12 intact               Skin: Skin is warm. No rashes, no other new lesions, LE edema - none               Psychiatric: Pt behavior is normal without agitation   Micro: none  Cardiac tracings I have personally interpreted today:  none  Pertinent Radiological findings (summarize): none   Lab Results  Component Value Date   WBC 8.2 08/31/2021   HGB 14.1 08/31/2021   HCT 42.5 08/31/2021   PLT 233.0 08/31/2021   GLUCOSE 125 (H) 08/31/2021   CHOL 203 (H) 08/31/2021   TRIG 199.0 (H) 08/31/2021   HDL 64.40 08/31/2021   LDLCALC 99 08/31/2021   ALT 15 08/31/2021   AST 12 08/31/2021   NA 140 08/31/2021   K 4.1 08/31/2021   CL 104 08/31/2021   CREATININE 0.88 08/31/2021   BUN 19 08/31/2021   CO2 29 08/31/2021   TSH 0.52 08/31/2021   HGBA1C 6.1 08/31/2021   Assessment/Plan:  Terri Fletcher is a 62 y.o. White or Caucasian [1] female with  has a past medical history of ALLERGIC RHINITIS, ANXIETY, SITUATIONAL, ASTHMA, COMMON MIGRAINE, DIARRHEA, RECURRENT, Hypertension, Impaired glucose tolerance (09/25/2012), INSOMNIA, PERSISTENT, and Stress fracture.  Encounter for well adult exam with  abnormal findings Age and sex appropriate education and counseling updated with regular exercise and diet Referrals for preventative services - pt encouraged to call for pap / mammogram but she declines Immunizations addressed - declines any and all immunizations due to needle phobia Smoking counseling  - none needed Evidence for depression or other mood disorder - none significant Most recent labs reviewed. I have personally reviewed and have noted: 1)  the patient's medical and social history 2) The patient's current medications and supplements 3) The patient's height, weight, and BMI have been recorded in the chart   Hyperglycemia Lab Results  Component Value Date   HGBA1C 6.1 08/31/2021   Stable, pt to continue current medical treatment  - diet, wt control   Hypertension BP Readings from Last 3 Encounters:  09/05/22 124/76  03/09/22 (!) 142/88  02/14/22 (!) 163/95   Stable, pt to continue medical treatment losartan 50 mg qd   Vitamin D deficiency Last vitamin D Lab Results  Component Value Date   VD25OH 29.23 (L) 08/31/2021   Low, to start oral replacement   Tremor C/w right hand intentional tremor, very mild, ok to follow  Followup: Return in about 6 months (around 03/08/2023).  Cathlean Cower, MD 09/05/2022 1:40 PM [facility]

## 2022-09-05 NOTE — Assessment & Plan Note (Signed)
Age and sex appropriate education and counseling updated with regular exercise and diet Referrals for preventative services - pt encouraged to call for pap / mammogram but she declines Immunizations addressed - declines any and all immunizations due to needle phobia Smoking counseling  - none needed Evidence for depression or other mood disorder - none significant Most recent labs reviewed. I have personally reviewed and have noted: 1) the patient's medical and social history 2) The patient's current medications and supplements 3) The patient's height, weight, and BMI have been recorded in the chart

## 2022-09-05 NOTE — Patient Instructions (Signed)
Please continue all other medications as before, and refills have been done if requested. ° °Please have the pharmacy call with any other refills you may need. ° °Please continue your efforts at being more active, low cholesterol diet, and weight control. ° °You are otherwise up to date with prevention measures today. ° °Please keep your appointments with your specialists as you may have planned ° °Please go to the LAB at the blood drawing area for the tests to be done ° °You will be contacted by phone if any changes need to be made immediately.  Otherwise, you will receive a letter about your results with an explanation, but please check with MyChart first. ° °Please remember to sign up for MyChart if you have not done so, as this will be important to you in the future with finding out test results, communicating by private email, and scheduling acute appointments online when needed. ° °Please make an Appointment to return in 6 months, or sooner if needed, °

## 2022-09-05 NOTE — Assessment & Plan Note (Signed)
Lab Results  Component Value Date   HGBA1C 6.1 08/31/2021   Stable, pt to continue current medical treatment  - diet, wt control

## 2022-09-05 NOTE — Assessment & Plan Note (Signed)
Last vitamin D Lab Results  Component Value Date   VD25OH 29.23 (L) 08/31/2021   Low, to start oral replacement

## 2022-09-05 NOTE — Assessment & Plan Note (Signed)
BP Readings from Last 3 Encounters:  09/05/22 124/76  03/09/22 (!) 142/88  02/14/22 (!) 163/95   Stable, pt to continue medical treatment losartan 50 mg qd

## 2022-10-14 ENCOUNTER — Encounter: Payer: Self-pay | Admitting: Podiatry

## 2022-10-14 ENCOUNTER — Ambulatory Visit: Payer: BC Managed Care – PPO | Admitting: Podiatry

## 2022-10-14 ENCOUNTER — Ambulatory Visit (INDEPENDENT_AMBULATORY_CARE_PROVIDER_SITE_OTHER): Payer: BC Managed Care – PPO

## 2022-10-14 DIAGNOSIS — M778 Other enthesopathies, not elsewhere classified: Secondary | ICD-10-CM

## 2022-10-14 DIAGNOSIS — M19072 Primary osteoarthritis, left ankle and foot: Secondary | ICD-10-CM | POA: Diagnosis not present

## 2022-10-14 MED ORDER — MELOXICAM 15 MG PO TABS: 15.0000 mg | ORAL_TABLET | Freq: Every day | ORAL | 0 refills | Status: DC | PRN

## 2022-10-14 NOTE — Patient Instructions (Signed)
Meloxicam Tablets What is this medication? MELOXICAM (mel OX i cam) treats mild to moderate pain, inflammation, or arthritis. It works by decreasing inflammation. It belongs to a group of medications called NSAIDs. This medicine may be used for other purposes; ask your health care provider or pharmacist if you have questions. COMMON BRAND NAME(S): Mobic What should I tell my care team before I take this medication? They need to know if you have any of these conditions: Asthma (lung or breathing disease) Bleeding disorder Coronary artery bypass graft (CABG) within the past 2 weeks Dehydration Frequently drink alcohol Heart attack Heart disease Heart failure High blood pressure Kidney disease Liver disease Stomach bleeding Stomach ulcers, other stomach or intestine problems Take medications that treat or prevent blood clots Taking other steroids, such as dexamethasone or prednisone Tobacco use An unusual or allergic reaction to meloxicam, other medications, foods, dyes, or preservatives Pregnant or trying to get pregnant Breast-feeding How should I use this medication? Take this medication by mouth. Take it as directed on the prescription label at the same time every day. You can take it with or without food. If it upsets your stomach, take it with food. Do not use it more often than directed. There may be unused or extra doses in the bottle after you finish your treatment. Talk to your care team if you have questions about your dose. A special MedGuide will be given to you by the pharmacist with each prescription and refill. Be sure to read this information carefully each time. Talk to your care team about the use of this medication in children. Special care may be needed. People over 65 years of age may have a stronger reaction and need a smaller dose. Overdosage: If you think you have taken too much of this medicine contact a poison control center or emergency room at once. NOTE:  This medicine is only for you. Do not share this medicine with others. What if I miss a dose? If you miss a dose, take it as soon as you can. If it is almost time for your next dose, take only that dose. Do not take double or extra doses. What may interact with this medication? Do not take this medication with any of the following: Cidofovir Ketorolac This medication may also interact with the following: Alcohol Aspirin and aspirin-like medications Certain medications for blood pressure, heart disease, irregular heart beat Certain medications for mental health conditions Certain medications that treat or prevent blood clots, such as warfarin, enoxaparin, dalteparin, apixaban, dabigatran, rivaroxaban Cyclosporine Diuretics Fluconazole Lithium Methotrexate Other NSAIDs, medications for pain and inflammation, such as ibuprofen and naproxen Pemetrexed This list may not describe all possible interactions. Give your health care provider a list of all the medicines, herbs, non-prescription drugs, or dietary supplements you use. Also tell them if you smoke, drink alcohol, or use illegal drugs. Some items may interact with your medicine. What should I watch for while using this medication? Visit your care team for regular checks on your progress. Tell your care team if your symptoms do not start to get better or if they get worse. Do not take other medications that contain aspirin, ibuprofen, or naproxen with this medication. Side effects such as stomach upset, nausea, or ulcers may be more likely to occur. Many non-prescription medications contain aspirin, ibuprofen, or naproxen. Always read labels carefully. This medication can cause serious ulcers and bleeding in the stomach. It can happen with no warning. Tobacco, alcohol, older age, and poor health   can also increase risks. Call your care team right away if you have stomach pain or blood in your vomit or stool. This medication does not prevent a  heart attack or stroke. This medication may increase the chance of a heart attack or stroke. The chance may increase the longer you use this medication or if you have heart disease. If you take aspirin to prevent a heart attack or stroke, talk to your care team about using this medication. This medication may cause serious skin reactions. They can happen weeks to months after starting the medication. Contact your care team right away if you notice fevers or flu-like symptoms with a rash. The rash may be red or purple and then turn into blisters or peeling of the skin. Or, you might notice a red rash with swelling of the face, lips or lymph nodes in your neck or under your arms. Talk to your care team if you wish to become pregnant or think you might be pregnant. This medication can cause serious birth defects. This medication may affect your coordination, reaction time, or judgment. Do not drive or operate machinery until you know how this medication affects you. Sit up or stand slowly to reduce the risk of dizzy or fainting spells. Drinking alcohol with this medication can increase the risk of these side effects. Be careful brushing or flossing your teeth or using a toothpick because you may get an infection or bleed more easily. If you have any dental work done, tell your dentist you are receiving this medication. This medication may make it more difficult to get pregnant. Talk to your care team if you are concerned about your fertility. What side effects may I notice from receiving this medication? Side effects that you should report to your care team as soon as possible: Allergic reactions--skin rash, itching, hives, swelling of the face, lips, tongue, or throat Bleeding--bloody or black, tar-like stools, vomiting blood or brown material that looks like coffee grounds, red or dark brown urine, small red or purple spots on skin, unusual bruising or bleeding Heart attack--pain or tightness in the chest,  shoulders, arms, or jaw, nausea, shortness of breath, cold or clammy skin, feeling faint or lightheaded Heart failure--shortness of breath, swelling of ankles, feet, or hands, sudden weight gain, unusual weakness or fatigue Increase in blood pressure Kidney injury--decrease in the amount of urine, swelling of the ankles, hands, or feet Liver injury--right upper belly pain, loss of appetite, nausea, light-colored stool, dark yellow or brown urine, yellowing skin or eyes, unusual weakness or fatigue Rash, fever, and swollen lymph nodes Redness, blistering, peeling, or loosening of the skin, including inside the mouth Stroke--sudden numbness or weakness of the face, arm, or leg, trouble speaking, confusion, trouble walking, loss of balance or coordination, dizziness, severe headache, change in vision Side effects that usually do not require medical attention (report to your care team if they continue or are bothersome): Diarrhea Nausea Upset stomach This list may not describe all possible side effects. Call your doctor for medical advice about side effects. You may report side effects to FDA at 1-800-FDA-1088. Where should I keep my medication? Keep out of the reach of children and pets. Store at room temperature between 20 and 25 degrees C (68 and 77 degrees F). Protect from moisture. Keep the container tightly closed. Get rid of any unused medication after the expiration date. To get rid of medications that are no longer needed or have expired: Take the medication to a medication   take-back program. Check with your pharmacy or law enforcement to find a location. If you cannot return the medication, check the label or package insert to see if the medication should be thrown out in the garbage or flushed down the toilet. If you are not sure, ask your care team. If it is safe to put it in the trash, empty the medication out of the container. Mix the medication with cat litter, dirt, coffee grounds, or  other unwanted substance. Seal the mixture in a bag or container. Put it in the trash. NOTE: This sheet is a summary. It may not cover all possible information. If you have questions about this medicine, talk to your doctor, pharmacist, or health care provider.  2023 Elsevier/Gold Standard (2020-08-19 00:00:00)  

## 2022-10-14 NOTE — Progress Notes (Unsigned)
Subjective:   Patient ID: Terri Fletcher, female   DOB: 62 y.o.   MRN: [id number]   HPI Chief Complaint  Patient presents with   Foot Pain    Dorsal midfoot left - aching x 1 year, worsened over the last 6 months, noticing sharp, shooting pains more frequently, especially at night, tried ice - no help   New Patient (Initial Visit)    [place] and she has been hard on her feet. Over the last 3 months it getting worse. No receint treatment, other than ice and rest and it helps a little. No recent injuries. Occasional swelling.    ROS      Objective:  Physical Exam  ***     Assessment:  ***     Plan:  ***

## 2022-11-13 ENCOUNTER — Other Ambulatory Visit: Payer: Self-pay | Admitting: Podiatry

## 2022-11-18 ENCOUNTER — Ambulatory Visit: Payer: BC Managed Care – PPO | Admitting: Internal Medicine

## 2022-11-18 ENCOUNTER — Encounter: Payer: Self-pay | Admitting: Internal Medicine

## 2022-11-18 VITALS — BP 126/80 | HR 54 | Temp 98.8°F | Ht 72.0 in | Wt 219.0 lb

## 2022-11-18 DIAGNOSIS — R6889 Other general symptoms and signs: Secondary | ICD-10-CM | POA: Diagnosis not present

## 2022-11-18 DIAGNOSIS — R739 Hyperglycemia, unspecified: Secondary | ICD-10-CM | POA: Diagnosis not present

## 2022-11-18 DIAGNOSIS — E538 Deficiency of other specified B group vitamins: Secondary | ICD-10-CM

## 2022-11-18 DIAGNOSIS — I10 Essential (primary) hypertension: Secondary | ICD-10-CM | POA: Diagnosis not present

## 2022-11-18 DIAGNOSIS — E559 Vitamin D deficiency, unspecified: Secondary | ICD-10-CM

## 2022-11-18 MED ORDER — VEOZAH 45 MG PO TABS: 45.0000 mg | ORAL_TABLET | Freq: Every day | ORAL | 3 refills | Status: DC

## 2022-11-18 NOTE — Patient Instructions (Signed)
Please take all new medication as prescribed - the veozah 45 mg per day  Please continue all other medications as before, and refills have been done if requested.  Please have the pharmacy call with any other refills you may need.  Please continue your efforts at being more active, low cholesterol diet, and weight control..  Please keep your appointments with your specialists as you may have planned

## 2022-11-18 NOTE — Progress Notes (Unsigned)
Patient ID: Terri Fletcher, female   DOB: 1960/12/17, 62 y.o.   MRN: [id number]        Chief Complaint: follow up heat flashes persistent, low vit D and b12, hld, hyperglycemia       HPI:  Terri Fletcher is a 62 y.o. female here with c/o persistent heat flashses multiple times per day, nothing seems to make better or worse.  Pt denies chest pain, increased sob or doe, wheezing, orthopnea, PND, increased LE swelling, palpitations, dizziness or syncope.   Pt denies polydipsia, polyuria, or new focal neuro s/s.    Pt denies fever, wt loss, night sweats, loss of appetite, or other constitutional symptoms          Wt Readings from Last 3 Encounters:  11/18/22 219 lb (99.3 kg)  09/05/22 216 lb (98 kg)  03/09/22 214 lb (97.1 kg)   BP Readings from Last 3 Encounters:  11/18/22 126/80  09/05/22 124/76  03/09/22 (!) 142/88         Past Medical History:  Diagnosis Date   ALLERGIC RHINITIS    ANXIETY, SITUATIONAL    ASTHMA    COMMON MIGRAINE    DIARRHEA, RECURRENT    Hypertension    Impaired glucose tolerance 09/25/2012   INSOMNIA, PERSISTENT    Stress fracture    Past Surgical History:  Procedure Laterality Date   BREAST SURGERY  11/05/2012   excision left breast mass   CESAREAN SECTION  06/25/1982   CESAREAN SECTION  11/25/1978    reports that she has quit smoking. Her smoking use included cigarettes. She has never used smokeless tobacco. She reports current alcohol use. She reports that she does not use drugs. family history includes Cancer in her maternal grandmother and mother; Heart attack in her maternal grandfather. Allergies  Allergen Reactions   Haldol [Haloperidol Lactate] Other (See Comments)    Lock Jaw   Cefuroxime Axetil     REACTION: nausea   Citalopram Other (See Comments)    dizzy   Current Outpatient Medications on File Prior to Visit  Medication Sig Dispense Refill   albuterol (VENTOLIN HFA) 108 (90 Base) MCG/ACT inhaler Inhale 2 puffs into the lungs every 6 (six)  hours as needed for wheezing or shortness of breath. 8 g 2   Cholecalciferol (D3 2000 PO) Take 2,000 Int'l Units/day by mouth.     co-enzyme Q-10 30 MG capsule Take 100 mg by mouth 3 (three) times daily.     diazepam (VALIUM) 5 MG tablet TAKE 1 TABLET BY MOUTH EVERY DAY AS NEEDED 30 tablet 2   gabapentin (NEURONTIN) 300 MG capsule Take one pill on day one, one pill bid on day 2 and then one pill tid thereafter 90 capsule 2   Ginkgo Biloba 40 MG TABS Take 120 mg by mouth.     losartan (COZAAR) 50 MG tablet Take 1 tablet (50 mg total) by mouth daily. 90 tablet 3   meloxicam (MOBIC) 15 MG tablet TAKE 1 TABLET BY MOUTH EVERY DAY AS NEEDED FOR PAIN 30 tablet 1   OMEGA-3 KRILL OIL PO Take by mouth.     zolpidem (AMBIEN) 10 MG tablet TAKE 1 TABLET (10 MG TOTAL) BY MOUTH AT BEDTIME AS NEEDED. FOR SLEEP 90 tablet 1   No current facility-administered medications on file prior to visit.        ROS:  All others reviewed and negative.  Objective        PE:  BP 126/80 (BP Location: Right Arm,  Patient Position: Sitting, Cuff Size: Normal)   Pulse (!) 54   Temp 98.8 F (37.1 C) (Oral)   Ht 6' (1.829 m)   Wt 219 lb (99.3 kg)   SpO2 99%   BMI 29.70 kg/m                 Constitutional: Pt appears in NAD               HENT: Head: NCAT.                Right Ear: External ear normal.                 Left Ear: External ear normal.                Eyes: . Pupils are equal, round, and reactive to light. Conjunctivae and EOM are normal               Nose: without d/c or deformity               Neck: Neck supple. Gross normal ROM               Cardiovascular: Normal rate and regular rhythm.                 Pulmonary/Chest: Effort normal and breath sounds without rales or wheezing.                Abd:  Soft, NT, ND, + BS, no organomegaly               Neurological: Pt is alert. At baseline orientation, motor grossly intact               Skin: Skin is warm. No rashes, no other new lesions, LE edema - none                Psychiatric: Pt behavior is normal without agitation   Micro: none  Cardiac tracings I have personally interpreted today:  none  Pertinent Radiological findings (summarize): none   Lab Results  Component Value Date   WBC 6.1 09/05/2022   HGB 14.7 09/05/2022   HCT 44.0 09/05/2022   PLT 234.0 09/05/2022   GLUCOSE 110 (H) 09/05/2022   CHOL 228 (H) 09/05/2022   TRIG 146.0 09/05/2022   HDL 67.50 09/05/2022   LDLCALC 131 (H) 09/05/2022   ALT 18 09/05/2022   AST 17 09/05/2022   NA 139 09/05/2022   K 4.2 09/05/2022   CL 102 09/05/2022   CREATININE 0.81 09/05/2022   BUN 15 09/05/2022   CO2 27 09/05/2022   TSH 1.61 09/05/2022   HGBA1C 6.0 09/05/2022   MICROALBUR <0.7 09/05/2022   Assessment/Plan:  Terri Fletcher is a 62 y.o. White or Caucasian [1] female with  has a past medical history of ALLERGIC RHINITIS, ANXIETY, SITUATIONAL, ASTHMA, COMMON MIGRAINE, DIARRHEA, RECURRENT, Hypertension, Impaired glucose tolerance (09/25/2012), INSOMNIA, PERSISTENT, and Stress fracture.  Hyperglycemia Lab Results  Component Value Date   HGBA1C 6.0 09/05/2022   Stable, pt to continue current medical treatment  - diet, wt control   Hypertension BP Readings from Last 3 Encounters:  11/18/22 126/80  09/05/22 124/76  03/09/22 (!) 142/88   Stable, pt to continue medical treatment losartan 50 qd   Vitamin D deficiency Last vitamin D Lab Results  Component Value Date   VD25OH 33.02 09/05/2022   Low to start oral replacement   Heat intolerance With multiple flashed daily for years -  for trial veozah 45 mg qd  B12 deficiency Lab Results  Component Value Date   VITAMINB12 212 09/05/2022   Low, to start oral replacement - b12 1000 mcg qd  Followup: Return if symptoms worsen or fail to improve.  Oliver Barre, MD 11/19/2022 9:25 PM [facility]

## 2022-11-19 ENCOUNTER — Encounter: Payer: Self-pay | Admitting: Internal Medicine

## 2022-11-19 NOTE — Assessment & Plan Note (Signed)
With multiple flashed daily for years - for trial veozah 45 mg qd

## 2022-11-19 NOTE — Assessment & Plan Note (Signed)
Lab Results  Component Value Date   VITAMINB12 212 09/05/2022   Low, to start oral replacement - b12 1000 mcg qd

## 2022-11-19 NOTE — Assessment & Plan Note (Signed)
BP Readings from Last 3 Encounters:  11/18/22 126/80  09/05/22 124/76  03/09/22 (!) 142/88   Stable, pt to continue medical treatment losartan 50 qd

## 2022-11-19 NOTE — Assessment & Plan Note (Signed)
Lab Results  Component Value Date   HGBA1C 6.0 09/05/2022   Stable, pt to continue current medical treatment  - diet, wt control  

## 2022-11-19 NOTE — Assessment & Plan Note (Signed)
Last vitamin D Lab Results  Component Value Date   VD25OH 33.02 09/05/2022   Low to start oral replacement

## 2022-11-22 ENCOUNTER — Encounter: Payer: Self-pay | Admitting: Internal Medicine

## 2022-11-22 MED ORDER — DIAZEPAM 5 MG PO TABS
ORAL_TABLET | ORAL | 2 refills | Status: DC
Start: 2022-11-22 — End: 2023-02-16

## 2022-12-23 ENCOUNTER — Encounter: Payer: Self-pay | Admitting: Internal Medicine

## 2022-12-27 MED ORDER — VEOZAH 45 MG PO TABS: 45.0000 mg | ORAL_TABLET | Freq: Every day | ORAL | 3 refills | Status: DC

## 2022-12-30 ENCOUNTER — Other Ambulatory Visit (HOSPITAL_COMMUNITY): Payer: Self-pay

## 2023-01-05 ENCOUNTER — Telehealth: Payer: Self-pay

## 2023-01-05 ENCOUNTER — Other Ambulatory Visit (HOSPITAL_COMMUNITY): Payer: Self-pay

## 2023-01-05 NOTE — Telephone Encounter (Signed)
Pharmacy Patient Advocate Encounter  Received notification from [facility] that Prior Authorization for Veozah 45 mg tablets has been  Submitted .  PA #/Case ID/Reference #: [id number]

## 2023-01-05 NOTE — Telephone Encounter (Signed)
I would not prescribe these medications due to increased risk of blood clotting in a sedentary obese person, and increased risk of breast cancer.  thanks

## 2023-01-05 NOTE — Telephone Encounter (Signed)
Pharmacy Patient Advocate Encounter   Received notification from CoverMyMeds that prior authorization for Veozah 45mg  is required/requested.   Insurance verification completed.   The patient is insured through [facility] .   Per test claim:  Estradiol, Estradio-norethidrone, Climara Pro, Combipatch, Dauvee, Premphase, Prempro is preferred by the ins.  If suggested medication is appropriate, Please send in a new RX and discontinue this one. If not, please advise as to why it's not appropriate so that we may request a Prior Authorization.   Key: [id number]      T

## 2023-01-09 ENCOUNTER — Other Ambulatory Visit (HOSPITAL_COMMUNITY): Payer: Self-pay

## 2023-01-09 NOTE — Telephone Encounter (Signed)
Pharmacy Patient Advocate Encounter  Received notification from [facility] that Prior Authorization for Terri Fletcher has been DENIED because see below.   PA #/Case ID/Reference #: [id number]   Please be advised we currently do not have a Pharmacist to review denials, therefore you will need to process appeals accordingly as needed. Thanks for your support at this time. Contact for appeals are as follows: Phone: N/A, Fax: [phone]

## 2023-02-02 ENCOUNTER — Encounter (INDEPENDENT_AMBULATORY_CARE_PROVIDER_SITE_OTHER): Payer: Self-pay

## 2023-02-03 ENCOUNTER — Encounter: Payer: Self-pay | Admitting: Internal Medicine

## 2023-02-03 ENCOUNTER — Other Ambulatory Visit: Payer: Self-pay | Admitting: Internal Medicine

## 2023-02-16 ENCOUNTER — Other Ambulatory Visit: Payer: Self-pay | Admitting: Internal Medicine

## 2023-03-08 ENCOUNTER — Encounter: Payer: Self-pay | Admitting: Internal Medicine

## 2023-03-08 ENCOUNTER — Ambulatory Visit: Payer: BC Managed Care – PPO | Admitting: Internal Medicine

## 2023-03-08 VITALS — BP 120/76 | HR 70 | Temp 98.0°F | Ht 72.0 in | Wt 219.0 lb

## 2023-03-08 DIAGNOSIS — E559 Vitamin D deficiency, unspecified: Secondary | ICD-10-CM | POA: Diagnosis not present

## 2023-03-08 DIAGNOSIS — R739 Hyperglycemia, unspecified: Secondary | ICD-10-CM

## 2023-03-08 DIAGNOSIS — E7800 Pure hypercholesterolemia, unspecified: Secondary | ICD-10-CM

## 2023-03-08 DIAGNOSIS — E538 Deficiency of other specified B group vitamins: Secondary | ICD-10-CM | POA: Diagnosis not present

## 2023-03-08 DIAGNOSIS — E785 Hyperlipidemia, unspecified: Secondary | ICD-10-CM | POA: Insufficient documentation

## 2023-03-08 DIAGNOSIS — I10 Essential (primary) hypertension: Secondary | ICD-10-CM | POA: Diagnosis not present

## 2023-03-08 DIAGNOSIS — F411 Generalized anxiety disorder: Secondary | ICD-10-CM

## 2023-03-08 NOTE — Assessment & Plan Note (Signed)
BP Readings from Last 3 Encounters:  03/08/23 120/76  11/18/22 126/80  09/05/22 124/76   Stable, pt to continue medical treatment losartan 50 mg qd

## 2023-03-08 NOTE — Progress Notes (Signed)
Patient ID: Terri Fletcher, female   DOB: Apr 23, 1961, 62 y.o.   MRN: [id number]        Chief Complaint: follow up low vit d and b12, hld, right knee djd, anxiety       HPI:  Terri Fletcher is a 62 y.o. female here overall doing ok;  Pt denies chest pain, increased sob or doe, wheezing, orthopnea, PND, increased LE swelling, palpitations, dizziness or syncope.   Pt denies polydipsia, polyuria, or new focal neuro s/s.    Pt denies fever, wt loss, night sweats, loss of appetite, or other constitutional symptoms  Declines statin or lab testing of flu shot today.  Right knee pain ongoing, wearing sleeve today for suppor, but no giveaways or falls.  Volt gel helps.    Still much stress at home, mother 92yo lives with her and now very limiting, and now mom in [place] and mom wants her to visit daily, but then dog died last wk, and also taking care of 17yo grandson in her home, and he fights with the mother and Terri Fletcher gets pulled in .  Tends to eat more with stress.  Wt up and down weekly 5-8 lbs.  Trying to get back to AM walks for exercise.   Veozah too expensive now but did work for 1 month, and willing to retry if less expensive.   Wt Readings from Last 3 Encounters:  03/08/23 219 lb (99.3 kg)  11/18/22 219 lb (99.3 kg)  09/05/22 216 lb (98 kg)   BP Readings from Last 3 Encounters:  03/08/23 120/76  11/18/22 126/80  09/05/22 124/76         Past Medical History:  Diagnosis Date   ALLERGIC RHINITIS    ANXIETY, SITUATIONAL    ASTHMA    COMMON MIGRAINE    DIARRHEA, RECURRENT    Hypertension    Impaired glucose tolerance 09/25/2012   INSOMNIA, PERSISTENT    Stress fracture    Past Surgical History:  Procedure Laterality Date   BREAST SURGERY  11/05/2012   excision left breast mass   CESAREAN SECTION  06/25/1982   CESAREAN SECTION  11/25/1978    reports that she has quit smoking. Her smoking use included cigarettes. She has never used smokeless tobacco. She reports current alcohol use. She  reports that she does not use drugs. family history includes Cancer in her maternal grandmother and mother; Heart attack in her maternal grandfather. Allergies  Allergen Reactions   Haldol [Haloperidol Lactate] Other (See Comments)    Lock Jaw   Cefuroxime Axetil     REACTION: nausea   Citalopram Other (See Comments)    dizzy   Current Outpatient Medications on File Prior to Visit  Medication Sig Dispense Refill   albuterol (VENTOLIN HFA) 108 (90 Base) MCG/ACT inhaler Inhale 2 puffs into the lungs every 6 (six) hours as needed for wheezing or shortness of breath. 8 g 2   Cholecalciferol (D3 2000 PO) Take 2,000 Int'l Units/day by mouth.     co-enzyme Q-10 30 MG capsule Take 100 mg by mouth 3 (three) times daily.     diazepam (VALIUM) 5 MG tablet TAKE 1 TABLET BY MOUTH EVERY DAY AS NEEDED 30 tablet 2   gabapentin (NEURONTIN) 300 MG capsule Take one pill on day one, one pill bid on day 2 and then one pill tid thereafter 90 capsule 2   Ginkgo Biloba 40 MG TABS Take 120 mg by mouth.     losartan (COZAAR) 50 MG tablet Take  1 tablet (50 mg total) by mouth daily. 90 tablet 3   meloxicam (MOBIC) 15 MG tablet TAKE 1 TABLET BY MOUTH EVERY DAY AS NEEDED FOR PAIN 30 tablet 1   OMEGA-3 KRILL OIL PO Take by mouth.     zolpidem (AMBIEN) 10 MG tablet TAKE 1 TABLET (10 MG TOTAL) BY MOUTH AT BEDTIME AS NEEDED. FOR SLEEP 30 tablet 5   No current facility-administered medications on file prior to visit.        ROS:  All others reviewed and negative.  Objective        PE:  BP 120/76 (BP Location: Left Arm, Patient Position: Sitting, Cuff Size: Normal)   Pulse 70   Temp 98 F (36.7 C) (Oral)   Ht 6' (1.829 m)   Wt 219 lb (99.3 kg)   SpO2 98%   BMI 29.70 kg/m                 Constitutional: Pt appears in NAD               HENT: Head: NCAT.                Right Ear: External ear normal.                 Left Ear: External ear normal.                Eyes: . Pupils are equal, round, and reactive to  light. Conjunctivae and EOM are normal               Nose: without d/c or deformity               Neck: Neck supple. Gross normal ROM               Cardiovascular: Normal rate and regular rhythm.                 Pulmonary/Chest: Effort normal and breath sounds without rales or wheezing.                Abd:  Soft, NT, ND, + BS, no organomegaly               Neurological: Pt is alert. At baseline orientation, motor grossly intact               Skin: Skin is warm. No rashes, no other new lesions, LE edema - none, right knee with trace effusion               Psychiatric: Pt behavior is normal without agitation , nervous  Micro: none  Cardiac tracings I have personally interpreted today:  none  Pertinent Radiological findings (summarize): none   Lab Results  Component Value Date   WBC 6.1 09/05/2022   HGB 14.7 09/05/2022   HCT 44.0 09/05/2022   PLT 234.0 09/05/2022   GLUCOSE 110 (H) 09/05/2022   CHOL 228 (H) 09/05/2022   TRIG 146.0 09/05/2022   HDL 67.50 09/05/2022   LDLCALC 131 (H) 09/05/2022   ALT 18 09/05/2022   AST 17 09/05/2022   NA 139 09/05/2022   K 4.2 09/05/2022   CL 102 09/05/2022   CREATININE 0.81 09/05/2022   BUN 15 09/05/2022   CO2 27 09/05/2022   TSH 1.61 09/05/2022   HGBA1C 6.0 09/05/2022   MICROALBUR <0.7 09/05/2022   Assessment/Plan:  Terri Fletcher is a 62 y.o. White or Caucasian [1] female with  has a past medical history  of ALLERGIC RHINITIS, ANXIETY, SITUATIONAL, ASTHMA, COMMON MIGRAINE, DIARRHEA, RECURRENT, Hypertension, Impaired glucose tolerance (09/25/2012), INSOMNIA, PERSISTENT, and Stress fracture.  Anxiety state Stable to mild worsening, ok to continue valium 5 mg prn asd  B12 deficiency Lab Results  Component Value Date   VITAMINB12 212 09/05/2022   Low, to start oral replacement - b12 1000 mcg qd   Hyperglycemia Lab Results  Component Value Date   HGBA1C 6.0 09/05/2022   Stable, pt to continue current medical treatment  - diet, wt  control   Hypertension BP Readings from Last 3 Encounters:  03/08/23 120/76  11/18/22 126/80  09/05/22 124/76   Stable, pt to continue medical treatment losartan 50 mg qd   Vitamin D deficiency Last vitamin D Lab Results  Component Value Date   VD25OH 33.02 09/05/2022   Low, to start oral replacement   HLD (hyperlipidemia) Lab Results  Component Value Date   LDLCALC 131 (H) 09/05/2022   Uncontrolled, declines statin, for lower chol diet,   Followup: Return in about 6 months (around 09/05/2023).  Oliver Barre, MD 03/08/2023 1:20 PM [facility] [place] [facility] - [facility]

## 2023-03-08 NOTE — Assessment & Plan Note (Signed)
Last vitamin D Lab Results  Component Value Date   VD25OH 33.02 09/05/2022   Low, to start oral replacement

## 2023-03-08 NOTE — Assessment & Plan Note (Signed)
Lab Results  Component Value Date   LDLCALC 131 (H) 09/05/2022   Uncontrolled, declines statin, for lower chol diet,

## 2023-03-08 NOTE — Assessment & Plan Note (Signed)
Lab Results  Component Value Date   HGBA1C 6.0 09/05/2022   Stable, pt to continue current medical treatment  - diet, wt control

## 2023-03-08 NOTE — Assessment & Plan Note (Signed)
Stable to mild worsening, ok to continue valium 5 mg prn asd

## 2023-03-08 NOTE — Assessment & Plan Note (Signed)
Lab Results  Component Value Date   VITAMINB12 212 09/05/2022   Low, to start oral replacement - b12 1000 mcg qd

## 2023-03-08 NOTE — Patient Instructions (Addendum)
Please take OTC Vitamin D3 at 2000 units per day, indefinitely, as well as OTC B12 1000 mcg per day  Please call if you change your mind about taking a statin  Please continue all other medications as before, including the Voltaren gel  Please have the pharmacy call with any other refills you may need.  Please continue your efforts at being more active, low cholesterol diet, and weight control  Please keep your appointments with your specialists as you may have planned  We can hold on blood testing today  Please make an Appointment to return in 6 months, or sooner if needed, also with Lab Appointment for testing done 3-5 days before at the FIRST FLOOR Lab (so this is for TWO appointments - please see the scheduling desk as you leave)

## 2023-03-09 ENCOUNTER — Encounter: Payer: Self-pay | Admitting: Internal Medicine

## 2023-03-10 MED ORDER — ZOLPIDEM TARTRATE 10 MG PO TABS: 10.0000 mg | ORAL_TABLET | Freq: Every evening | ORAL | 1 refills | Status: DC | PRN

## 2023-03-23 ENCOUNTER — Other Ambulatory Visit: Payer: Self-pay

## 2023-03-23 ENCOUNTER — Other Ambulatory Visit: Payer: Self-pay | Admitting: Internal Medicine

## 2023-06-01 ENCOUNTER — Encounter: Payer: Self-pay | Admitting: Internal Medicine

## 2023-06-01 MED ORDER — DIAZEPAM 5 MG PO TABS: ORAL_TABLET | ORAL | 2 refills | Status: DC

## 2023-06-01 NOTE — Telephone Encounter (Signed)
Done erx 

## 2023-07-13 ENCOUNTER — Telehealth (INDEPENDENT_AMBULATORY_CARE_PROVIDER_SITE_OTHER): Payer: BC Managed Care – PPO | Admitting: Internal Medicine

## 2023-07-13 ENCOUNTER — Other Ambulatory Visit: Payer: Self-pay | Admitting: Internal Medicine

## 2023-07-13 DIAGNOSIS — J019 Acute sinusitis, unspecified: Secondary | ICD-10-CM | POA: Diagnosis not present

## 2023-07-13 DIAGNOSIS — R739 Hyperglycemia, unspecified: Secondary | ICD-10-CM

## 2023-07-13 DIAGNOSIS — E559 Vitamin D deficiency, unspecified: Secondary | ICD-10-CM | POA: Diagnosis not present

## 2023-07-13 DIAGNOSIS — R062 Wheezing: Secondary | ICD-10-CM

## 2023-07-13 MED ORDER — HYDROCODONE BIT-HOMATROP MBR 5-1.5 MG/5ML PO SOLN: 5.0000 mL | Freq: Four times a day (QID) | ORAL | 0 refills | Status: AC | PRN

## 2023-07-13 MED ORDER — DOXYCYCLINE HYCLATE 100 MG PO TABS: 100.0000 mg | ORAL_TABLET | Freq: Two times a day (BID) | ORAL | 0 refills | Status: DC

## 2023-07-13 MED ORDER — PREDNISONE 10 MG PO TABS: ORAL_TABLET | ORAL | 0 refills | Status: DC

## 2023-07-13 MED ORDER — ALBUTEROL SULFATE HFA 108 (90 BASE) MCG/ACT IN AERS: 2.0000 | INHALATION_SPRAY | Freq: Four times a day (QID) | RESPIRATORY_TRACT | 2 refills | Status: DC | PRN

## 2023-07-13 NOTE — Progress Notes (Signed)
Patient ID: Terri Fletcher, female   DOB: 09-Feb-1961, 63 y.o.   MRN: [id number]  Virtual Visit via Video Note  I connected with Terri Fletcher on 07/16/23 at  3:00 PM EST by a video enabled telemedicine application and verified that I am speaking with the correct person using two identifiers.  Location of all participants today Patient: at home Provider: at office   I discussed the limitations of evaluation and management by telemedicine and the availability of in person appointments. The patient expressed understanding and agreed to proceed.  History of Present Illness: Here with acute onset mild to mod 2-3 days ST, HA, general weakness and malaise, with prod cough greenish sputum, but Pt denies chest pain, increased sob or doe, wheezing, orthopnea, PND, increased LE swelling, palpitations, dizziness or syncope, except for mild sob wheezing since last pm.   Pt denies polydipsia, polyuria, or new focal neuro s/s.    Pt denies wt loss, night sweats, loss of appetite, or other constitutional symptoms    Past Medical History:  Diagnosis Date   ALLERGIC RHINITIS    ANXIETY, SITUATIONAL    ASTHMA    COMMON MIGRAINE    DIARRHEA, RECURRENT    Hypertension    Impaired glucose tolerance 09/25/2012   INSOMNIA, PERSISTENT    Stress fracture    Past Surgical History:  Procedure Laterality Date   BREAST SURGERY  11/05/2012   excision left breast mass   CESAREAN SECTION  06/25/1982   CESAREAN SECTION  11/25/1978    reports that she has quit smoking. Her smoking use included cigarettes. She has never used smokeless tobacco. She reports current alcohol use. She reports that she does not use drugs. family history includes Cancer in her maternal grandmother and mother; Heart attack in her maternal grandfather. Allergies  Allergen Reactions   Haldol [Haloperidol Lactate] Other (See Comments)    Lock Jaw   Cefuroxime Axetil     REACTION: nausea   Citalopram Other (See Comments)    dizzy   Current  Outpatient Medications on File Prior to Visit  Medication Sig Dispense Refill   Cholecalciferol (D3 2000 PO) Take 2,000 Int'l Units/day by mouth.     Fletcher-enzyme Q-10 30 MG capsule Take 100 mg by mouth 3 (three) times daily.     diazepam (VALIUM) 5 MG tablet TAKE 1 TABLET BY MOUTH EVERY DAY AS NEEDED 30 tablet 2   gabapentin (NEURONTIN) 300 MG capsule Take one pill on day one, one pill bid on day 2 and then one pill tid thereafter 90 capsule 2   Ginkgo Biloba 40 MG TABS Take 120 mg by mouth.     losartan (COZAAR) 50 MG tablet TAKE 1 TABLET BY MOUTH EVERY DAY 90 tablet 3   meloxicam (MOBIC) 15 MG tablet TAKE 1 TABLET BY MOUTH EVERY DAY AS NEEDED FOR PAIN 30 tablet 1   OMEGA-3 KRILL OIL PO Take by mouth.     zolpidem (AMBIEN) 10 MG tablet Take 1 tablet (10 mg total) by mouth at bedtime as needed. for sleep 90 tablet 1   No current facility-administered medications on file prior to visit.   Observations/Objective: Alert, mld ill appropriate mood and affect, resps normal, cn 2-12 intact, moves all 4s, no visible rash or swelling Lab Results  Component Value Date   WBC 6.1 09/05/2022   HGB 14.7 09/05/2022   HCT 44.0 09/05/2022   PLT 234.0 09/05/2022   GLUCOSE 110 (H) 09/05/2022   CHOL 228 (H) 09/05/2022   TRIG  146.0 09/05/2022   HDL 67.50 09/05/2022   LDLCALC 131 (H) 09/05/2022   ALT 18 09/05/2022   AST 17 09/05/2022   NA 139 09/05/2022   K 4.2 09/05/2022   CL 102 09/05/2022   CREATININE 0.81 09/05/2022   BUN 15 09/05/2022   CO2 27 09/05/2022   TSH 1.61 09/05/2022   HGBA1C 6.0 09/05/2022   MICROALBUR <0.7 09/05/2022   Assessment and Plan: See notes  Follow Up Instructions: See notes   I discussed the assessment and treatment plan with the patient. The patient was provided an opportunity to ask questions and all were answered. The patient agreed with the plan and demonstrated an understanding of the instructions.   The patient was advised to call back or seek an in-person  evaluation if the symptoms worsen or if the condition fails to improve as anticipated.   Oliver Barre, MD

## 2023-07-16 ENCOUNTER — Encounter: Payer: Self-pay | Admitting: Internal Medicine

## 2023-07-16 NOTE — Assessment & Plan Note (Signed)
Lab Results  Component Value Date   HGBA1C 6.0 09/05/2022   Stable, pt to continue current medical treatment  - diet, wt control

## 2023-07-16 NOTE — Patient Instructions (Signed)
Please take all new medication as prescribed

## 2023-07-16 NOTE — Assessment & Plan Note (Signed)
Mild to mod, for prednisone taper prn, cough med prn, to f/u any worsening symptoms or concerns

## 2023-07-16 NOTE — Assessment & Plan Note (Signed)
Mild to mod, for antibx course doxycycline 100 bid,  to f/u any worsening symptoms or concerns

## 2023-07-16 NOTE — Assessment & Plan Note (Signed)
Last vitamin D Lab Results  Component Value Date   VD25OH 33.02 09/05/2022   Low, to start oral replacement

## 2023-07-24 ENCOUNTER — Other Ambulatory Visit (INDEPENDENT_AMBULATORY_CARE_PROVIDER_SITE_OTHER): Payer: BC Managed Care – PPO

## 2023-07-24 DIAGNOSIS — E7800 Pure hypercholesterolemia, unspecified: Secondary | ICD-10-CM

## 2023-07-24 DIAGNOSIS — E538 Deficiency of other specified B group vitamins: Secondary | ICD-10-CM

## 2023-07-24 DIAGNOSIS — E559 Vitamin D deficiency, unspecified: Secondary | ICD-10-CM

## 2023-07-24 DIAGNOSIS — R739 Hyperglycemia, unspecified: Secondary | ICD-10-CM

## 2023-07-24 LAB — HEPATIC FUNCTION PANEL
ALT: 21 U/L (ref 0–35)
AST: 16 U/L (ref 0–37)
Albumin: 4.6 g/dL (ref 3.5–5.2)
Alkaline Phosphatase: 70 U/L (ref 39–117)
Bilirubin, Direct: 0.1 mg/dL (ref 0.0–0.3)
Total Bilirubin: 0.8 mg/dL (ref 0.2–1.2)
Total Protein: 6.9 g/dL (ref 6.0–8.3)

## 2023-07-24 LAB — BASIC METABOLIC PANEL
BUN: 14 mg/dL (ref 6–23)
CO2: 25 meq/L (ref 19–32)
Calcium: 9.6 mg/dL (ref 8.4–10.5)
Chloride: 102 meq/L (ref 96–112)
Creatinine, Ser: 0.96 mg/dL (ref 0.40–1.20)
GFR: 62.83 mL/min (ref >60.00)
Glucose, Bld: 114 mg/dL — ABNORMAL HIGH (ref 70–99)
Potassium: 3.9 meq/L (ref 3.5–5.1)
Sodium: 136 meq/L (ref 135–145)

## 2023-07-24 LAB — CBC WITH DIFFERENTIAL/PLATELET
Basophils Absolute: 0 10*3/uL (ref 0.0–0.1)
Basophils Relative: 0.5 % (ref 0.0–3.0)
Eosinophils Absolute: 0 10*3/uL (ref 0.0–0.7)
Eosinophils Relative: 0.2 % (ref 0.0–5.0)
HCT: 45.2 % (ref 36.0–46.0)
Hemoglobin: 15 g/dL (ref 12.0–15.0)
Lymphocytes Relative: 37.6 % (ref 12.0–46.0)
Lymphs Abs: 2 10*3/uL (ref 0.7–4.0)
MCHC: 33.1 g/dL (ref 30.0–36.0)
MCV: 93.7 fL (ref 78.0–100.0)
Monocytes Absolute: 0.4 10*3/uL (ref 0.1–1.0)
Monocytes Relative: 7.6 % (ref 3.0–12.0)
Neutro Abs: 2.9 10*3/uL (ref 1.4–7.7)
Neutrophils Relative %: 54.1 % (ref 43.0–77.0)
Platelets: 221 10*3/uL (ref 150.0–400.0)
RBC: 4.82 Mil/uL (ref 3.87–5.11)
RDW: 13.3 % (ref 11.5–15.5)
WBC: 5.4 10*3/uL (ref 4.0–10.5)

## 2023-07-24 LAB — URINALYSIS, ROUTINE W REFLEX MICROSCOPIC
Bilirubin Urine: NEGATIVE
Hgb urine dipstick: NEGATIVE
Ketones, ur: NEGATIVE
Leukocytes,Ua: NEGATIVE
Nitrite: NEGATIVE
Specific Gravity, Urine: >=1.03 — AB (ref 1.000–1.030)
Total Protein, Urine: 30 — AB
Urine Glucose: NEGATIVE
Urobilinogen, UA: 0.2 (ref 0.0–1.0)
pH: 6 (ref 5.0–8.0)

## 2023-07-24 LAB — TSH: TSH: 0.94 u[IU]/mL (ref 0.35–5.50)

## 2023-07-24 LAB — LIPID PANEL
Cholesterol: 154 mg/dL (ref 0–200)
HDL: 48.4 mg/dL (ref >39.00)
LDL Cholesterol: 83 mg/dL (ref 0–99)
NonHDL: 105.4
Total CHOL/HDL Ratio: 3
Triglycerides: 111 mg/dL (ref 0.0–149.0)
VLDL: 22.2 mg/dL (ref 0.0–40.0)

## 2023-07-24 LAB — HEMOGLOBIN A1C: Hgb A1c MFr Bld: 6.1 % (ref 4.6–6.5)

## 2023-07-24 LAB — MICROALBUMIN / CREATININE URINE RATIO
Creatinine,U: 461.6 mg/dL
Microalb Creat Ratio: 0.8 mg/g (ref 0.0–30.0)
Microalb, Ur: 3.5 mg/dL — ABNORMAL HIGH (ref 0.0–1.9)

## 2023-07-24 LAB — VITAMIN D 25 HYDROXY (VIT D DEFICIENCY, FRACTURES): VITD: 56.02 ng/mL (ref 30.00–100.00)

## 2023-07-24 LAB — VITAMIN B12: Vitamin B-12: >1537 pg/mL — ABNORMAL HIGH (ref 211–911)

## 2023-07-31 ENCOUNTER — Ambulatory Visit: Payer: BC Managed Care – PPO | Admitting: Internal Medicine

## 2023-07-31 ENCOUNTER — Encounter: Payer: Self-pay | Admitting: Internal Medicine

## 2023-07-31 VITALS — BP 128/78 | HR 57 | Temp 98.9°F | Ht 72.0 in | Wt 211.4 lb

## 2023-07-31 DIAGNOSIS — E559 Vitamin D deficiency, unspecified: Secondary | ICD-10-CM

## 2023-07-31 DIAGNOSIS — E538 Deficiency of other specified B group vitamins: Secondary | ICD-10-CM

## 2023-07-31 DIAGNOSIS — Z0001 Encounter for general adult medical examination with abnormal findings: Secondary | ICD-10-CM

## 2023-07-31 DIAGNOSIS — E7800 Pure hypercholesterolemia, unspecified: Secondary | ICD-10-CM | POA: Diagnosis not present

## 2023-07-31 DIAGNOSIS — M1711 Unilateral primary osteoarthritis, right knee: Secondary | ICD-10-CM | POA: Insufficient documentation

## 2023-07-31 DIAGNOSIS — Z0000 Encounter for general adult medical examination without abnormal findings: Secondary | ICD-10-CM

## 2023-07-31 DIAGNOSIS — R051 Acute cough: Secondary | ICD-10-CM

## 2023-07-31 DIAGNOSIS — R739 Hyperglycemia, unspecified: Secondary | ICD-10-CM

## 2023-07-31 DIAGNOSIS — I10 Essential (primary) hypertension: Secondary | ICD-10-CM

## 2023-07-31 NOTE — Assessment & Plan Note (Signed)
 BP Readings from Last 3 Encounters:  07/31/23 128/78  03/08/23 120/76  11/18/22 126/80   Stable, pt to continue medical treatment losartan  50 qd

## 2023-07-31 NOTE — Patient Instructions (Signed)
 Ok to cancel the Mar appt  Please continue all other medications as before, and refills have been done if requested.  Please have the pharmacy call with any other refills you may need.  Please continue your efforts at being more active, low cholesterol diet, and weight control.  You are otherwise up to date with prevention measures today.  Please keep your appointments with your specialists as you may have planned  Please make an Appointment to return about sept 2025, or sooner if needed

## 2023-07-31 NOTE — Assessment & Plan Note (Signed)
 Last vitamin D  Lab Results  Component Value Date   VD25OH 56.02 07/24/2023   Stable, cont oral replacement

## 2023-07-31 NOTE — Assessment & Plan Note (Signed)
 Mild now essentially resolved, likely URI viral improving,  to f/u any worsening symptoms or concerns

## 2023-07-31 NOTE — Progress Notes (Signed)
 Patient ID: Terri Fletcher, female   DOB: 03-13-1961, 63 y.o.   MRN: [id number]         Chief Complaint:: wellness exam and Medical Management of Chronic Issues (Follow up from flu/bronchitis and discuss abnormalities and recent lab results. Patient notes within the last 3 days having better breathing and cough. Patient notes they have not taken Q-10 due to size of medication)  , right knee djd, lo b12, hld, hyperglycemia, htn, low vit d       HPI:  Terri Fletcher is a 63 y.o. female here for wellness exam; declines all immunizations, colonoscopy, pap for now, o/w up to date                        Also has worsening right knee arthritic pain with intermittent effusion, managed so far with voltaren  gel.  Does not want cortisone due to needle phobia,  Did have recent cough with feverish now essentially resolved.  Pt denies chest pain, increased sob or doe, wheezing, orthopnea, PND, increased LE swelling, palpitations, dizziness or syncope.  Pt denies polydipsia, polyuria, or new focal neuro s/s.    Pt denies recent wt loss, night sweats, loss of appetite, or other constitutional symptoms     Wt Readings from Last 3 Encounters:  07/31/23 211 lb 6.4 oz (95.9 kg)  03/08/23 219 lb (99.3 kg)  11/18/22 219 lb (99.3 kg)   BP Readings from Last 3 Encounters:  07/31/23 128/78  03/08/23 120/76  11/18/22 126/80   There is no immunization history on file for this patient. Health Maintenance Due  Topic Date Due   Pneumococcal Vaccine 73-66 Years old (1 of 2 - PCV) Never done   Cervical Cancer Screening (HPV/Pap Cotest)  Never done   Colonoscopy  Never done   Zoster Vaccines- Shingrix (1 of 2) Never done   INFLUENZA VACCINE  Never done      Past Medical History:  Diagnosis Date   ALLERGIC RHINITIS    ANXIETY, SITUATIONAL    ASTHMA    COMMON MIGRAINE    DIARRHEA, RECURRENT    Hypertension    Impaired glucose tolerance 09/25/2012   INSOMNIA, PERSISTENT    Stress fracture    Past Surgical History:   Procedure Laterality Date   BREAST SURGERY  11/05/2012   excision left breast mass   CESAREAN SECTION  06/25/1982   CESAREAN SECTION  11/25/1978    reports that she has quit smoking. Her smoking use included cigarettes. She has never used smokeless tobacco. She reports current alcohol use. She reports that she does not use drugs. family history includes Cancer in her maternal grandmother and mother; Heart attack in her maternal grandfather. Allergies  Allergen Reactions   Haldol [Haloperidol Lactate] Other (See Comments)    Lock Jaw   Cefuroxime Axetil     REACTION: nausea   Citalopram  Other (See Comments)    dizzy   Current Outpatient Medications on File Prior to Visit  Medication Sig Dispense Refill   Cholecalciferol (D3 2000 PO) Take 2,000 Int'l Units/day by mouth.     diazepam  (VALIUM ) 5 MG tablet TAKE 1 TABLET BY MOUTH EVERY DAY AS NEEDED 30 tablet 2   doxycycline  (VIBRA -TABS) 100 MG tablet Take 1 tablet (100 mg total) by mouth 2 (two) times daily. 20 tablet 0   gabapentin  (NEURONTIN ) 300 MG capsule Take one pill on day one, one pill bid on day 2 and then one pill tid thereafter 90 capsule  2   Ginkgo Biloba 40 MG TABS Take 120 mg by mouth.     levalbuterol (XOPENEX HFA) 45 MCG/ACT inhaler 2 puffs four times per day as needed for shortness of breath 15 g 2   losartan  (COZAAR ) 50 MG tablet TAKE 1 TABLET BY MOUTH EVERY DAY 90 tablet 3   meloxicam  (MOBIC ) 15 MG tablet TAKE 1 TABLET BY MOUTH EVERY DAY AS NEEDED FOR PAIN 30 tablet 1   OMEGA-3 KRILL OIL PO Take by mouth.     predniSONE  (DELTASONE ) 10 MG tablet 3 tabs by mouth per day for 3 days,2tabs per day for 3 days,1tab per day for 3 days 18 tablet 0   zolpidem  (AMBIEN ) 10 MG tablet Take 1 tablet (10 mg total) by mouth at bedtime as needed. for sleep 90 tablet 1   co-enzyme Q-10 30 MG capsule Take 100 mg by mouth 3 (three) times daily. (Patient not taking: Reported on 07/31/2023)     No current facility-administered medications on  file prior to visit.        ROS:  All others reviewed and negative.  Objective        PE:  BP 128/78   Pulse (!) 57   Temp 98.9 F (37.2 C)   Ht 6' (1.829 m)   Wt 211 lb 6.4 oz (95.9 kg)   SpO2 97%   BMI 28.67 kg/m                 Constitutional: Pt appears in NAD               HENT: Head: NCAT.                Right Ear: External ear normal.                 Left Ear: External ear normal. Bilat tm's with mild erythema.  Max sinus areas non tender.  Pharynx with mild erythema, no exudate               Eyes: . Pupils are equal, round, and reactive to light. Conjunctivae and EOM are normal               Nose: without d/c or deformity               Neck: Neck supple. Gross normal ROM               Cardiovascular: Normal rate and regular rhythm.                 Pulmonary/Chest: Effort normal and breath sounds without rales or wheezing.                Abd:  Soft, NT, ND, + BS, no organomegaly               Neurological: Pt is alert. At baseline orientation, motor grossly intact               Skin: Skin is warm. No rashes, no other new lesions, LE edema - none               Psychiatric: Pt behavior is normal without agitation   Micro: none  Cardiac tracings I have personally interpreted today:  none  Pertinent Radiological findings (summarize): none   Lab Results  Component Value Date   WBC 5.4 07/24/2023   HGB 15.0 07/24/2023   HCT 45.2 07/24/2023   PLT 221.0 07/24/2023   GLUCOSE 114 (H)  07/24/2023   CHOL 154 07/24/2023   TRIG 111.0 07/24/2023   HDL 48.40 07/24/2023   LDLCALC 83 07/24/2023   ALT 21 07/24/2023   AST 16 07/24/2023   NA 136 07/24/2023   K 3.9 07/24/2023   CL 102 07/24/2023   CREATININE 0.96 07/24/2023   BUN 14 07/24/2023   CO2 25 07/24/2023   TSH 0.94 07/24/2023   HGBA1C 6.1 07/24/2023   MICROALBUR 3.5 (H) 07/24/2023   Assessment/Plan:  Terri Fletcher is a 63 y.o. White or Caucasian [1] female with  has a past medical history of ALLERGIC RHINITIS,  ANXIETY, SITUATIONAL, ASTHMA, COMMON MIGRAINE, DIARRHEA, RECURRENT, Hypertension, Impaired glucose tolerance (09/25/2012), INSOMNIA, PERSISTENT, and Stress fracture.  Encounter for well adult exam with abnormal findings Age and sex appropriate education and counseling updated with regular exercise and diet Referrals for preventative services - declines colonoscopy and pap for now Immunizations addressed - decliens all due to needle phobia Smoking counseling  - none needed Evidence for depression or other mood disorder - chronic anxiety stable Most recent labs reviewed. I have personally reviewed and have noted: 1) the patient's medical and social history 2) The patient's current medications and supplements 3) The patient's height, weight, and BMI have been recorded in the chart   Arthritis of right knee Mild to mod worsening, for contd voltaren  gel prn, to sport med if not improved or worsening,,  to f/u any worsening symptoms or concerns  B12 deficiency Lab Results  Component Value Date   VITAMINB12 >1537 (H) 07/24/2023   Stable, cont oral replacement - b12 1000 mcg qd   Cough Mild now essentially resolved, likely URI viral improving,  to f/u any worsening symptoms or concerns  HLD (hyperlipidemia) Lab Results  Component Value Date   LDLCALC 83 07/24/2023   Stable, pt to continue low chol diet,  to f/u any worsening symptoms or concerns   Hyperglycemia Lab Results  Component Value Date   HGBA1C 6.1 07/24/2023   Stable, pt to continue current medical treatment  - diet, wt control   Hypertension BP Readings from Last 3 Encounters:  07/31/23 128/78  03/08/23 120/76  11/18/22 126/80   Stable, pt to continue medical treatment losartan  50 qd   Vitamin D  deficiency Last vitamin D  Lab Results  Component Value Date   VD25OH 56.02 07/24/2023   Stable, cont oral replacement  Followup: Return in about 29 weeks (around 02/19/2024).  Rosalia Colonel, MD 07/31/2023 8:52  PM [facility]

## 2023-07-31 NOTE — Assessment & Plan Note (Signed)
 Lab Results  Component Value Date   HGBA1C 6.1 07/24/2023   Stable, pt to continue current medical treatment  - diet, wt control

## 2023-07-31 NOTE — Assessment & Plan Note (Signed)
 Lab Results  Component Value Date   LDLCALC 83 07/24/2023   Stable, pt to continue low chol diet,  to f/u any worsening symptoms or concerns

## 2023-07-31 NOTE — Assessment & Plan Note (Signed)
 Lab Results  Component Value Date   VITAMINB12 >1537 (H) 07/24/2023   Stable, cont oral replacement - b12 1000 mcg qd

## 2023-07-31 NOTE — Assessment & Plan Note (Signed)
 Age and sex appropriate education and counseling updated with regular exercise and diet Referrals for preventative services - declines colonoscopy and pap for now Immunizations addressed - decliens all due to needle phobia Smoking counseling  - none needed Evidence for depression or other mood disorder - chronic anxiety stable Most recent labs reviewed. I have personally reviewed and have noted: 1) the patient's medical and social history 2) The patient's current medications and supplements 3) The patient's height, weight, and BMI have been recorded in the chart

## 2023-07-31 NOTE — Assessment & Plan Note (Signed)
 Mild to mod worsening, for contd voltaren  gel prn, to sport med if not improved or worsening,,  to f/u any worsening symptoms or concerns

## 2023-08-03 ENCOUNTER — Ambulatory Visit: Payer: BC Managed Care – PPO | Admitting: Internal Medicine

## 2023-08-21 ENCOUNTER — Encounter: Payer: Self-pay | Admitting: Internal Medicine

## 2023-08-21 ENCOUNTER — Other Ambulatory Visit: Payer: Self-pay | Admitting: Internal Medicine

## 2023-08-21 MED ORDER — ZOLPIDEM TARTRATE 10 MG PO TABS: 10.0000 mg | ORAL_TABLET | Freq: Every evening | ORAL | 1 refills | Status: DC | PRN

## 2023-08-29 ENCOUNTER — Other Ambulatory Visit: Payer: Self-pay | Admitting: Internal Medicine

## 2023-08-29 ENCOUNTER — Encounter: Payer: Self-pay | Admitting: Internal Medicine

## 2023-08-29 MED ORDER — DIAZEPAM 5 MG PO TABS: ORAL_TABLET | ORAL | 2 refills | Status: DC

## 2023-09-05 ENCOUNTER — Ambulatory Visit: Payer: BC Managed Care – PPO | Admitting: Internal Medicine

## 2023-11-30 ENCOUNTER — Encounter: Payer: Self-pay | Admitting: Internal Medicine

## 2023-11-30 MED ORDER — DIAZEPAM 5 MG PO TABS: ORAL_TABLET | ORAL | 2 refills | Status: DC

## 2023-12-31 ENCOUNTER — Encounter: Payer: Self-pay | Admitting: Internal Medicine

## 2024-01-01 ENCOUNTER — Other Ambulatory Visit: Payer: Self-pay

## 2024-01-01 NOTE — Telephone Encounter (Signed)
 Ok this is noted, no further recommendations at this time

## 2024-01-05 ENCOUNTER — Ambulatory Visit: Admitting: Orthopaedic Surgery

## 2024-01-23 ENCOUNTER — Encounter: Payer: Self-pay | Admitting: Orthopaedic Surgery

## 2024-01-23 ENCOUNTER — Other Ambulatory Visit (INDEPENDENT_AMBULATORY_CARE_PROVIDER_SITE_OTHER): Payer: Self-pay

## 2024-01-23 ENCOUNTER — Ambulatory Visit: Admitting: Orthopaedic Surgery

## 2024-01-23 DIAGNOSIS — M25562 Pain in left knee: Secondary | ICD-10-CM | POA: Diagnosis not present

## 2024-01-23 DIAGNOSIS — G8929 Other chronic pain: Secondary | ICD-10-CM | POA: Diagnosis not present

## 2024-01-23 NOTE — Progress Notes (Signed)
 Office Visit Note   Patient: Terri Fletcher           Date of Birth: [birth year]           MRN: [id number] Visit Date: 01/23/2024              Requested by: Norleen Lynwood ORN, MD [address] [place],  [place] [address] PCP: Norleen Lynwood ORN, MD   Assessment & Plan: Visit Diagnoses:  1. Chronic pain of left knee     Plan: History of Present Illness Terri Fletcher is a 63 year old female who presents with left knee pain and swelling.  She experiences aching pain in her left knee, especially when stepping down and pivoting, which sometimes causes the knee to catch. The pain is associated with swelling in the knee and the back of her leg, causing tightness and limiting her ability to fully bend the knee. The intensity of the pain varies, with some days being manageable and others causing significant discomfort. Occasionally, the knee catches, preventing her from bearing weight and requiring her to hobble until it resolves.  She has a family history of osteoarthritis. She has tried meloxicam , which caused stomach issues, and currently takes Aleve  as needed for severe pain. She uses a compression sleeve on days when the knee is particularly bothersome.  She engages in physical activities such as using a hot tub to simulate swimming exercises and has a treadmill, although knee pain limits her use of it. Walking sometimes alleviates the pain, while at other times it exacerbates it. She switched from Tylenol to Aleve  about a year ago.  Physical Exam MUSCULOSKELETAL: No effusion in the knee.  Results RADIOLOGY Knee X-ray: Osteoarthritis with osteophyte formation and joint irregularity  Assessment and Plan Left knee osteoarthritis Chronic pain with catching and swelling, exacerbated by certain movements. X-rays show arthritis with spurring. Likely arthritis over degenerative meniscus tear. Meloxicam  not tolerated; Aleve  provides some relief. Avoids injections due to needle phobia. - Prescribe Aleve   (naproxen ) 1 tablet daily as needed for pain. - Refer to physical therapy for knee exercises and strengthening. - Recommend use of compression knee brace for support and stability. - Advise continuation of home exercises, including swimming-like activities in hot tub. - Discuss potential use of steroid dose pack if symptoms worsen and injections remain undesirable.  Follow-Up Instructions: No follow-ups on file.   Orders:  Orders Placed This Encounter  Procedures   XR KNEE 3 VIEW LEFT   Ambulatory referral to Physical Therapy   No orders of the defined types were placed in this encounter.     Procedures: No procedures performed   Clinical Data: No additional findings.   Subjective: Chief Complaint  Patient presents with   Left Knee - Pain    HPI  Review of Systems  Constitutional: Negative.   HENT: Negative.    Eyes: Negative.   Respiratory: Negative.    Cardiovascular: Negative.   Endocrine: Negative.   Musculoskeletal: Negative.   Neurological: Negative.   Hematological: Negative.   Psychiatric/Behavioral: Negative.    All other systems reviewed and are negative.    Objective: Vital Signs: There were no vitals taken for this visit.  Physical Exam Vitals and nursing note reviewed.  Constitutional:      Appearance: She is well-developed.  HENT:     Head: Atraumatic.     Nose: Nose normal.  Eyes:     Extraocular Movements: Extraocular movements intact.  Cardiovascular:     Pulses: Normal  pulses.  Pulmonary:     Effort: Pulmonary effort is normal.  Abdominal:     Palpations: Abdomen is soft.  Musculoskeletal:     Cervical back: Neck supple.  Skin:    General: Skin is warm.     Capillary Refill: Capillary refill takes less than 2 seconds.  Neurological:     Mental Status: She is alert. Mental status is at baseline.  Psychiatric:        Behavior: Behavior normal.        Thought Content: Thought content normal.        Judgment: Judgment normal.      Ortho Exam  Specialty Comments:  No specialty comments available.  Imaging: XR KNEE 3 VIEW LEFT Result Date: 01/23/2024 X-rays of the left knee show mild osteoarthritis.    PMFS History: Patient Active Problem List   Diagnosis Date Noted   Arthritis of right knee 07/31/2023   Acute sinusitis 07/13/2023   HLD (hyperlipidemia) 03/08/2023   Heat intolerance 11/18/2022   B12 deficiency 11/18/2022   Tremor 09/05/2022   Obesity (BMI 30-39.9) 03/12/2022   Vitamin D  deficiency 08/31/2021   Cough 08/29/2021   Wheezing 08/29/2021   Hypertension 07/20/2020   Acute asthmatic bronchitis 03/16/2020   Sinusitis 12/22/2019   Hyperglycemia 08/22/2018   Pain in left wrist 11/15/2017   Ganglion of left wrist 09/14/2017   Leg cramping 09/14/2017   Night sweats 09/14/2017   Weight loss 09/14/2017   Upper respiratory tract infection 04/20/2017   Left groin pain 10/19/2016   Abdominal pain, epigastric 03/19/2013   Left lumbar radiculopathy 02/07/2013   Elevated blood pressure reading without diagnosis of hypertension 02/07/2013   Left breast mass 12/06/2012   Hematochezia 10/09/2012   LLQ pain 09/25/2012   Frequency of urination 09/05/2012   Encounter for well adult exam with abnormal findings 05/30/2011   Anxiety state 03/01/2010   Migraine without aura 03/01/2010   DIARRHEA, RECURRENT 03/01/2010   INSOMNIA, PERSISTENT 01/04/2008   Allergic rhinitis 05/21/2007   Asthma 03/04/2007   Past Medical History:  Diagnosis Date   ALLERGIC RHINITIS    ANXIETY, SITUATIONAL    ASTHMA    COMMON MIGRAINE    DIARRHEA, RECURRENT    Hypertension    Impaired glucose tolerance 09/25/2012   INSOMNIA, PERSISTENT    Stress fracture     Family History  Problem Relation Age of Onset   Cancer Mother        Breast   Cancer Maternal Grandmother    Heart attack Maternal Grandfather     Past Surgical History:  Procedure Laterality Date   BREAST SURGERY  11/05/2012   excision left breast  mass   CESAREAN SECTION  06/25/1982   CESAREAN SECTION  11/25/1978   Social History   Occupational History   Not on file  Tobacco Use   Smoking status: Former    Types: Cigarettes   Smokeless tobacco: Never  Substance and Sexual Activity   Alcohol use: Yes    Alcohol/week: 0.0 standard drinks of alcohol    Comment: Occasional.   Drug use: No    Types: Marijuana    Comment: Occassional.   Sexual activity: Not on file

## 2024-02-15 ENCOUNTER — Encounter: Payer: Self-pay | Admitting: Internal Medicine

## 2024-02-15 DIAGNOSIS — R739 Hyperglycemia, unspecified: Secondary | ICD-10-CM

## 2024-02-15 DIAGNOSIS — E538 Deficiency of other specified B group vitamins: Secondary | ICD-10-CM

## 2024-02-15 DIAGNOSIS — E7800 Pure hypercholesterolemia, unspecified: Secondary | ICD-10-CM

## 2024-02-15 DIAGNOSIS — E559 Vitamin D deficiency, unspecified: Secondary | ICD-10-CM

## 2024-02-16 ENCOUNTER — Other Ambulatory Visit: Payer: Self-pay | Admitting: Internal Medicine

## 2024-03-01 ENCOUNTER — Other Ambulatory Visit (INDEPENDENT_AMBULATORY_CARE_PROVIDER_SITE_OTHER)

## 2024-03-01 DIAGNOSIS — R739 Hyperglycemia, unspecified: Secondary | ICD-10-CM

## 2024-03-01 DIAGNOSIS — E7800 Pure hypercholesterolemia, unspecified: Secondary | ICD-10-CM

## 2024-03-01 DIAGNOSIS — E538 Deficiency of other specified B group vitamins: Secondary | ICD-10-CM | POA: Diagnosis not present

## 2024-03-01 DIAGNOSIS — E559 Vitamin D deficiency, unspecified: Secondary | ICD-10-CM

## 2024-03-01 LAB — VITAMIN B12: Vitamin B-12: >1500 pg/mL — ABNORMAL HIGH (ref 211–911)

## 2024-03-01 LAB — LIPID PANEL
Cholesterol: 191 mg/dL (ref 0–200)
HDL: 58.9 mg/dL (ref >39.00)
LDL Cholesterol: 109 mg/dL — ABNORMAL HIGH (ref 0–99)
NonHDL: 131.6
Total CHOL/HDL Ratio: 3
Triglycerides: 112 mg/dL (ref 0.0–149.0)
VLDL: 22.4 mg/dL (ref 0.0–40.0)

## 2024-03-01 LAB — BASIC METABOLIC PANEL WITH GFR
BUN: 16 mg/dL (ref 6–23)
CO2: 27 meq/L (ref 19–32)
Calcium: 10 mg/dL (ref 8.4–10.5)
Chloride: 102 meq/L (ref 96–112)
Creatinine, Ser: 0.94 mg/dL (ref 0.40–1.20)
GFR: 64.16 mL/min (ref >60.00)
Glucose, Bld: 122 mg/dL — ABNORMAL HIGH (ref 70–99)
Potassium: 3.9 meq/L (ref 3.5–5.1)
Sodium: 139 meq/L (ref 135–145)

## 2024-03-01 LAB — HEPATIC FUNCTION PANEL
ALT: 20 U/L (ref 0–35)
AST: 17 U/L (ref 0–37)
Albumin: 4.8 g/dL (ref 3.5–5.2)
Alkaline Phosphatase: 73 U/L (ref 39–117)
Bilirubin, Direct: 0.1 mg/dL (ref 0.0–0.3)
Total Bilirubin: 0.7 mg/dL (ref 0.2–1.2)
Total Protein: 7.2 g/dL (ref 6.0–8.3)

## 2024-03-01 LAB — VITAMIN D 25 HYDROXY (VIT D DEFICIENCY, FRACTURES): VITD: 67.2 ng/mL (ref 30.00–100.00)

## 2024-03-01 LAB — HEMOGLOBIN A1C: Hgb A1c MFr Bld: 6.1 % (ref 4.6–6.5)

## 2024-03-07 ENCOUNTER — Ambulatory Visit: Payer: BC Managed Care – PPO | Admitting: Internal Medicine

## 2024-03-07 ENCOUNTER — Encounter: Payer: Self-pay | Admitting: Internal Medicine

## 2024-03-07 VITALS — BP 142/86 | HR 84 | Temp 98.3°F | Ht 72.0 in | Wt 212.4 lb

## 2024-03-07 DIAGNOSIS — E559 Vitamin D deficiency, unspecified: Secondary | ICD-10-CM

## 2024-03-07 DIAGNOSIS — R739 Hyperglycemia, unspecified: Secondary | ICD-10-CM

## 2024-03-07 DIAGNOSIS — E7800 Pure hypercholesterolemia, unspecified: Secondary | ICD-10-CM

## 2024-03-07 DIAGNOSIS — E538 Deficiency of other specified B group vitamins: Secondary | ICD-10-CM | POA: Diagnosis not present

## 2024-03-07 DIAGNOSIS — I10 Essential (primary) hypertension: Secondary | ICD-10-CM

## 2024-03-07 MED ORDER — LOSARTAN POTASSIUM 100 MG PO TABS: 100.0000 mg | ORAL_TABLET | Freq: Every day | ORAL | 3 refills | Status: AC

## 2024-03-07 NOTE — Patient Instructions (Signed)
Ok to increase the losartan to 100 mg per day  Please continue all other medications as before, and refills have been done if requested.  Please have the pharmacy call with any other refills you may need.  Please continue your efforts at being more active, low cholesterol diet, and weight control.  Please keep your appointments with your specialists as you may have planned  Please make an Appointment to return in 6 months, or sooner if needed, also with Lab Appointment for testing done 3-5 days before at the [facility] (so this is for TWO appointments - please see the scheduling desk as you leave)

## 2024-03-07 NOTE — Progress Notes (Signed)
 Patient ID: Terri Fletcher, female   DOB: 03/28/1961, 63 y.o.   MRN: [id number]        Chief Complaint: follow up HTN, HLD and hyperglycemia, left knee arthritis       HPI:  Terri Fletcher is a 63 y.o. female here overall doing ok; Pt denies chest pain, increased sob or doe, wheezing, orthopnea, PND, increased LE swelling, palpitations, dizziness or syncope.   Pt denies polydipsia, polyuria, or new focal neuro s/s.    Pt denies fever, wt loss, night sweats, loss of appetite, or other constitutional symptoms  Does have several wks worsening left knee pain, just had declines cortisone per ortho Dr Jerri but willing to do home knee exercises PT.  She is back to walking more.         Wt Readings from Last 3 Encounters:  03/07/24 212 lb 6.4 oz (96.3 kg)  07/31/23 211 lb 6.4 oz (95.9 kg)  03/08/23 219 lb (99.3 kg)   BP Readings from Last 3 Encounters:  03/07/24 (!) 142/86  07/31/23 128/78  03/08/23 120/76         Past Medical History:  Diagnosis Date   ALLERGIC RHINITIS    ANXIETY, SITUATIONAL    ASTHMA    COMMON MIGRAINE    DIARRHEA, RECURRENT    Hypertension    Impaired glucose tolerance 09/25/2012   INSOMNIA, PERSISTENT    Stress fracture    Past Surgical History:  Procedure Laterality Date   BREAST SURGERY  11/05/2012   excision left breast mass   CESAREAN SECTION  06/25/1982   CESAREAN SECTION  11/25/1978    reports that she has quit smoking. Her smoking use included cigarettes. She has never used smokeless tobacco. She reports current alcohol use. She reports that she does not use drugs. family history includes Cancer in her maternal grandmother and mother; Heart attack in her maternal grandfather. Allergies  Allergen Reactions   Haldol [Haloperidol Lactate] Other (See Comments)    Lock Jaw   Cefuroxime Axetil     REACTION: nausea   Citalopram  Other (See Comments)    dizzy   Current Outpatient Medications on File Prior to Visit  Medication Sig Dispense Refill   Cholecalciferol  (D3 2000 PO) Take 2,000 Int'l Units/day by mouth.     diazepam  (VALIUM ) 5 MG tablet TAKE 1 TABLET BY MOUTH EVERY DAY AS NEEDED 30 tablet 2   gabapentin  (NEURONTIN ) 300 MG capsule Take one pill on day one, one pill bid on day 2 and then one pill tid thereafter 90 capsule 2   Ginkgo Biloba 40 MG TABS Take 120 mg by mouth.     levalbuterol (XOPENEX HFA) 45 MCG/ACT inhaler 2 puffs four times per day as needed for shortness of breath 15 g 2   meloxicam  (MOBIC ) 15 MG tablet TAKE 1 TABLET BY MOUTH EVERY DAY AS NEEDED FOR PAIN 30 tablet 1   Methylfol-Methylcob-Acetylcyst (CEREFOLIN BRAIN WELLNESS PO) Take 1 tablet by mouth daily.     OMEGA-3 KRILL OIL PO Take by mouth.     predniSONE  (DELTASONE ) 10 MG tablet 3 tabs by mouth per day for 3 days,2tabs per day for 3 days,1tab per day for 3 days 18 tablet 0   zolpidem  (AMBIEN ) 10 MG tablet TAKE 1 TABLET (10 MG TOTAL) BY MOUTH AT BEDTIME AS NEEDED FOR SLEEP 90 tablet 1   co-enzyme Q-10 30 MG capsule Take 100 mg by mouth 3 (three) times daily. (Patient not taking: Reported on 03/07/2024)  No current facility-administered medications on file prior to visit.        ROS:  All others reviewed and negative.  Objective        PE:  BP (!) 142/86   Pulse 84   Temp 98.3 F (36.8 C)   Ht 6' (1.829 m)   Wt 212 lb 6.4 oz (96.3 kg)   SpO2 97%   BMI 28.81 kg/m                 Constitutional: Pt appears in NAD               HENT: Head: NCAT.                Right Ear: External ear normal.                 Left Ear: External ear normal.                Eyes: . Pupils are equal, round, and reactive to light. Conjunctivae and EOM are normal               Nose: without d/c or deformity               Neck: Neck supple. Gross normal ROM               Cardiovascular: Normal rate and regular rhythm.                 Pulmonary/Chest: Effort normal and breath sounds without rales or wheezing.                Abd:  Soft, NT, ND, + BS, no organomegaly                Neurological: Pt is alert. At baseline orientation, motor grossly intact               Skin: Skin is warm. No rashes, no other new lesions, LE edema - none               Psychiatric: Pt behavior is normal without agitation   Micro: none  Cardiac tracings I have personally interpreted today:  none  Pertinent Radiological findings (summarize): none   Lab Results  Component Value Date   WBC 5.4 07/24/2023   HGB 15.0 07/24/2023   HCT 45.2 07/24/2023   PLT 221.0 07/24/2023   GLUCOSE 122 (H) 03/01/2024   CHOL 191 03/01/2024   TRIG 112.0 03/01/2024   HDL 58.90 03/01/2024   LDLCALC 109 (H) 03/01/2024   ALT 20 03/01/2024   AST 17 03/01/2024   NA 139 03/01/2024   K 3.9 03/01/2024   CL 102 03/01/2024   CREATININE 0.94 03/01/2024   BUN 16 03/01/2024   CO2 27 03/01/2024   TSH 0.94 07/24/2023   HGBA1C 6.1 03/01/2024   Assessment/Plan:  Terri Fletcher is a 63 y.o. White or Caucasian [1] female with  has a past medical history of ALLERGIC RHINITIS, ANXIETY, SITUATIONAL, ASTHMA, COMMON MIGRAINE, DIARRHEA, RECURRENT, Hypertension, Impaired glucose tolerance (09/25/2012), INSOMNIA, PERSISTENT, and Stress fracture.  Vitamin D  deficiency Last vitamin D  Lab Results  Component Value Date   VD25OH 67.20 03/01/2024   Stable, cont oral replacement   Hypertension BP Readings from Last 3 Encounters:  03/07/24 (!) 142/86  07/31/23 128/78  03/08/23 120/76   Uncontrolled, pt to increase losartan  100 mg qd   Hyperglycemia Lab Results  Component Value Date   HGBA1C 6.1 03/01/2024  Stable, pt to continue current medical treatment - diet, wt control   B12 deficiency Lab Results  Component Value Date   VITAMINB12 >1500 (H) 03/01/2024   Stable, cont oral replacement - b12 1000 mcg qd   HLD (hyperlipidemia) Lab Results  Component Value Date   LDLCALC 109 (H) 03/01/2024   Uncontrolled, for lower chol diet, declines statin  Followup: Return in about 6 months (around  09/04/2024).  [name], MD 03/10/2024 12:06 PM  [facility]

## 2024-03-10 NOTE — Addendum Note (Signed)
 Addended by: NORLEEN LYNWOOD ORN on: 03/10/2024 12:07 PM   Modules accepted: Orders

## 2024-03-10 NOTE — Assessment & Plan Note (Signed)
 Lab Results  Component Value Date   VITAMINB12 >1500 (H) 03/01/2024   Stable, cont oral replacement - b12 1000 mcg qd

## 2024-03-10 NOTE — Assessment & Plan Note (Signed)
 BP Readings from Last 3 Encounters:  03/07/24 (!) 142/86  07/31/23 128/78  03/08/23 120/76   Uncontrolled, pt to increase losartan  100 mg qd

## 2024-03-10 NOTE — Assessment & Plan Note (Signed)
 Lab Results  Component Value Date   LDLCALC 109 (H) 03/01/2024   Uncontrolled, for lower chol diet, declines statin

## 2024-03-10 NOTE — Assessment & Plan Note (Signed)
 Last vitamin D  Lab Results  Component Value Date   VD25OH 67.20 03/01/2024   Stable, cont oral replacement

## 2024-03-10 NOTE — Assessment & Plan Note (Signed)
 Lab Results  Component Value Date   HGBA1C 6.1 03/01/2024   Stable, pt to continue current medical treatment  - diet, wt control

## 2024-03-11 ENCOUNTER — Encounter: Payer: Self-pay | Admitting: Internal Medicine

## 2024-03-12 MED ORDER — DIAZEPAM 5 MG PO TABS: ORAL_TABLET | ORAL | 2 refills | Status: DC

## 2024-04-01 DIAGNOSIS — M47816 Spondylosis without myelopathy or radiculopathy, lumbar region: Secondary | ICD-10-CM | POA: Diagnosis not present

## 2024-04-01 DIAGNOSIS — M5416 Radiculopathy, lumbar region: Secondary | ICD-10-CM | POA: Diagnosis not present

## 2024-04-01 DIAGNOSIS — Z6830 Body mass index (BMI) 30.0-30.9, adult: Secondary | ICD-10-CM | POA: Diagnosis not present

## 2024-04-22 ENCOUNTER — Encounter: Payer: Self-pay | Admitting: Radiology

## 2024-06-12 ENCOUNTER — Other Ambulatory Visit: Payer: Self-pay | Admitting: Internal Medicine

## 2024-06-12 ENCOUNTER — Encounter: Payer: Self-pay | Admitting: Internal Medicine

## 2024-06-17 ENCOUNTER — Encounter

## 2024-06-28 ENCOUNTER — Encounter

## 2024-07-01 ENCOUNTER — Encounter: Payer: Self-pay | Admitting: Internal Medicine

## 2024-07-01 ENCOUNTER — Ambulatory Visit: Admitting: Internal Medicine

## 2024-07-01 VITALS — BP 140/78 | HR 65 | Temp 97.8°F | Ht 72.0 in | Wt 215.0 lb

## 2024-07-01 DIAGNOSIS — R1011 Right upper quadrant pain: Secondary | ICD-10-CM | POA: Diagnosis not present

## 2024-07-01 DIAGNOSIS — R739 Hyperglycemia, unspecified: Secondary | ICD-10-CM

## 2024-07-01 DIAGNOSIS — I10 Essential (primary) hypertension: Secondary | ICD-10-CM | POA: Diagnosis not present

## 2024-07-01 DIAGNOSIS — E538 Deficiency of other specified B group vitamins: Secondary | ICD-10-CM | POA: Diagnosis not present

## 2024-07-01 MED ORDER — ONDANSETRON 4 MG PO TBDP: 4.0000 mg | ORAL_TABLET | Freq: Three times a day (TID) | ORAL | 1 refills | Status: AC | PRN

## 2024-07-01 NOTE — Assessment & Plan Note (Signed)
 BP Readings from Last 3 Encounters:  07/01/24 (!) 140/78  03/07/24 (!) 142/86  07/31/23 128/78   Uncontrolled, likely reactive, pt to continue medical treatment losartan  100 mg every day, declines other change

## 2024-07-01 NOTE — Patient Instructions (Signed)
 Please take all new medication as prescribed - the zofran  for nausea as needed  Please continue all other medications as before, and refills have been done if requested.  Please have the pharmacy call with any other refills you may need.  Please keep your appointments with your specialists as you may have planned  You will be contacted regarding the referral for: Gallbladder liver ultrasound  Please go to the LAB at the blood drawing area for the tests to be done  You will be contacted by phone if any changes need to be made immediately.  Otherwise, you will receive a letter about your results with an explanation, but please check with MyChart first.

## 2024-07-01 NOTE — Assessment & Plan Note (Signed)
 Mod to severe pain intermittent, worse with food, afeb VSS non toxic but will need zofran  prn, lab including cbc, bmp, ua LFTs and urgent Liver GB ultrasound

## 2024-07-01 NOTE — Assessment & Plan Note (Signed)
 Lab Results  Component Value Date   HGBA1C 6.1 03/01/2024   Stable, pt to continue current medical treatment  - diet, wt control

## 2024-07-01 NOTE — Progress Notes (Signed)
 Patient ID: Terri Fletcher, female   DOB: 13-Jan-1961, 64 y.o.   MRN: [id number]        Chief Complaint: follow up ruq pain x 3 days       HPI:  Terri Fletcher is a 64 y.o. female here with c/o nausea with abd cramping the RUQ pain moderate to severe for last 3 days intermittent, though no vomiting, fever chills, Denies worsening reflux, dysphagia, bowel change or blood.  Pain worse with eating.  Pt denies chest pain, increased sob or doe, wheezing, orthopnea, PND, increased LE swelling, palpitations, dizziness or syncope.   Pt denies polydipsia, polyuria, or new focal neuro s/s.          Wt Readings from Last 3 Encounters:  07/01/24 215 lb (97.5 kg)  03/07/24 212 lb 6.4 oz (96.3 kg)  07/31/23 211 lb 6.4 oz (95.9 kg)   BP Readings from Last 3 Encounters:  07/01/24 (!) 140/78  03/07/24 (!) 142/86  07/31/23 128/78         Past Medical History:  Diagnosis Date   ALLERGIC RHINITIS    ANXIETY, SITUATIONAL    ASTHMA    COMMON MIGRAINE    DIARRHEA, RECURRENT    Hypertension    Impaired glucose tolerance 09/25/2012   INSOMNIA, PERSISTENT    Stress fracture    Past Surgical History:  Procedure Laterality Date   BREAST SURGERY  11/05/2012   excision left breast mass   CESAREAN SECTION  06/25/1982   CESAREAN SECTION  11/25/1978    reports that she has quit smoking. Her smoking use included cigarettes. She has never used smokeless tobacco. She reports current alcohol use. She reports that she does not use drugs. family history includes Cancer in her maternal grandmother and mother; Heart attack in her maternal grandfather. Allergies[1] Medications Ordered Prior to Encounter[2]      ROS:  All others reviewed and negative.  Objective        PE:  BP (!) 140/78 (BP Location: Left Arm, Patient Position: Sitting, Cuff Size: Normal)   Pulse 65   Temp 97.8 F (36.6 C) (Oral)   Ht 6' (1.829 m)   Wt 215 lb (97.5 kg)   SpO2 98%   BMI 29.16 kg/m                 Constitutional: Pt appears in  pain, non toxic               HENT: Head: NCAT.                Right Ear: External ear normal.                 Left Ear: External ear normal.                Eyes: . Pupils are equal, round, and reactive to light. Conjunctivae and EOM are normal               Nose: without d/c or deformity               Neck: Neck supple. Gross normal ROM               Cardiovascular: Normal rate and regular rhythm.                 Pulmonary/Chest: Effort normal and breath sounds without rales or wheezing.                Abd:  Soft, mod  RUQ tender, ND, + BS, no organomegaly               Neurological: Pt is alert. At baseline orientation, motor grossly intact               Skin: Skin is warm. No rashes, no other new lesions, LE edema - none               Psychiatric: Pt behavior is normal without agitation   Micro: none  Cardiac tracings I have personally interpreted today:  none  Pertinent Radiological findings (summarize): none   Lab Results  Component Value Date   WBC 5.4 07/24/2023   HGB 15.0 07/24/2023   HCT 45.2 07/24/2023   PLT 221.0 07/24/2023   GLUCOSE 122 (H) 03/01/2024   CHOL 191 03/01/2024   TRIG 112.0 03/01/2024   HDL 58.90 03/01/2024   LDLCALC 109 (H) 03/01/2024   ALT 20 03/01/2024   AST 17 03/01/2024   NA 139 03/01/2024   K 3.9 03/01/2024   CL 102 03/01/2024   CREATININE 0.94 03/01/2024   BUN 16 03/01/2024   CO2 27 03/01/2024   TSH 0.94 07/24/2023   HGBA1C 6.1 03/01/2024   Assessment/Plan:  Terri Fletcher is a 64 y.o. White or Caucasian [1] female with  has a past medical history of ALLERGIC RHINITIS, ANXIETY, SITUATIONAL, ASTHMA, COMMON MIGRAINE, DIARRHEA, RECURRENT, Hypertension, Impaired glucose tolerance (09/25/2012), INSOMNIA, PERSISTENT, and Stress fracture.  RUQ pain Mod to severe pain intermittent, worse with food, afeb VSS non toxic but will need zofran  prn, lab including cbc, bmp, ua LFTs and urgent Liver GB ultrasound  Hypertension BP Readings from Last 3  Encounters:  07/01/24 (!) 140/78  03/07/24 (!) 142/86  07/31/23 128/78   Uncontrolled, likely reactive, pt to continue medical treatment losartan  100 mg every day, declines other change   Hyperglycemia Lab Results  Component Value Date   HGBA1C 6.1 03/01/2024   Stable, pt to continue current medical treatment  - diet, wt control  Followup: Return if symptoms worsen or fail to improve.  [name], MD 07/01/2024 7:34 PM [facility] - [facility]     [1]  Allergies Allergen Reactions   Haldol [Haloperidol Lactate] Other (See Comments)    Lock Jaw   Cefuroxime Axetil     REACTION: nausea   Citalopram  Other (See Comments)    dizzy  [2]  Current Outpatient Medications on File Prior to Visit  Medication Sig Dispense Refill   Cholecalciferol (D3 2000 PO) Take 2,000 Int'l Units/day by mouth.     co-enzyme Q-10 30 MG capsule Take 100 mg by mouth 3 (three) times daily. (Patient not taking: Reported on 03/07/2024)     diazepam  (VALIUM ) 5 MG tablet TAKE 1 TABLET BY MOUTH EVERY DAY AS NEEDED 30 tablet 2   gabapentin  (NEURONTIN ) 300 MG capsule Take one pill on day one, one pill bid on day 2 and then one pill tid thereafter 90 capsule 2   Ginkgo Biloba 40 MG TABS Take 120 mg by mouth.     levalbuterol (XOPENEX HFA) 45 MCG/ACT inhaler 2 puffs four times per day as needed for shortness of breath 15 g 2   losartan  (COZAAR ) 100 MG tablet Take 1 tablet (100 mg total) by mouth daily. 90 tablet 3   meloxicam  (MOBIC ) 15 MG tablet TAKE 1 TABLET BY MOUTH EVERY DAY AS NEEDED FOR PAIN 30 tablet 1   Methylfol-Methylcob-Acetylcyst (CEREFOLIN BRAIN WELLNESS PO)  Take 1 tablet by mouth daily.     OMEGA-3 KRILL OIL PO Take by mouth.     predniSONE  (DELTASONE ) 10 MG tablet 3 tabs by mouth per day for 3 days,2tabs per day for 3 days,1tab per day for 3 days 18 tablet 0   zolpidem  (AMBIEN ) 10 MG tablet TAKE 1 TABLET (10 MG TOTAL) BY MOUTH AT BEDTIME AS  NEEDED FOR SLEEP 90 tablet 1   No current facility-administered medications on file prior to visit.

## 2024-07-02 ENCOUNTER — Telehealth: Payer: Self-pay

## 2024-07-02 ENCOUNTER — Ambulatory Visit: Payer: Self-pay | Admitting: Internal Medicine

## 2024-07-02 ENCOUNTER — Inpatient Hospital Stay: Admission: RE | Admit: 2024-07-02 | Discharge: 2024-07-02 | Attending: Internal Medicine | Admitting: Internal Medicine

## 2024-07-02 DIAGNOSIS — K8020 Calculus of gallbladder without cholecystitis without obstruction: Secondary | ICD-10-CM

## 2024-07-02 DIAGNOSIS — R1011 Right upper quadrant pain: Secondary | ICD-10-CM

## 2024-07-02 LAB — CBC WITH DIFFERENTIAL/PLATELET
Basophils Absolute: 0 K/uL (ref 0.0–0.1)
Basophils Relative: 0.5 % (ref 0.0–3.0)
Eosinophils Absolute: 0 K/uL (ref 0.0–0.7)
Eosinophils Relative: 0.3 % (ref 0.0–5.0)
HCT: 43.9 % (ref 36.0–46.0)
Hemoglobin: 14.6 g/dL (ref 12.0–15.0)
Lymphocytes Relative: 31.7 % (ref 12.0–46.0)
Lymphs Abs: 2 K/uL (ref 0.7–4.0)
MCHC: 33.2 g/dL (ref 30.0–36.0)
MCV: 92.7 fl (ref 78.0–100.0)
Monocytes Absolute: 0.4 K/uL (ref 0.1–1.0)
Monocytes Relative: 6.3 % (ref 3.0–12.0)
Neutro Abs: 3.9 K/uL (ref 1.4–7.7)
Neutrophils Relative %: 61.2 % (ref 43.0–77.0)
Platelets: 223 K/uL (ref 150.0–400.0)
RBC: 4.74 Mil/uL (ref 3.87–5.11)
RDW: 13.3 % (ref 11.5–15.5)
WBC: 6.3 K/uL (ref 4.0–10.5)

## 2024-07-02 LAB — HEPATIC FUNCTION PANEL
ALT: 23 U/L (ref 3–35)
AST: 18 U/L (ref 5–37)
Albumin: 4.8 g/dL (ref 3.5–5.2)
Alkaline Phosphatase: 79 U/L (ref 39–117)
Bilirubin, Direct: 0.1 mg/dL (ref 0.1–0.3)
Total Bilirubin: 0.8 mg/dL (ref 0.2–1.2)
Total Protein: 7.6 g/dL (ref 6.0–8.3)

## 2024-07-02 LAB — URINALYSIS, ROUTINE W REFLEX MICROSCOPIC
Bilirubin Urine: NEGATIVE
Ketones, ur: NEGATIVE
Leukocytes,Ua: NEGATIVE
Nitrite: NEGATIVE
Specific Gravity, Urine: 1.025 (ref 1.000–1.030)
Total Protein, Urine: NEGATIVE
Urine Glucose: NEGATIVE
Urobilinogen, UA: 0.2 (ref 0.0–1.0)
WBC, UA: NONE SEEN
pH: 6 (ref 5.0–8.0)

## 2024-07-02 LAB — LIPASE: Lipase: 31 U/L (ref 11.0–59.0)

## 2024-07-02 LAB — BASIC METABOLIC PANEL WITH GFR
BUN: 13 mg/dL (ref 6–23)
CO2: 24 meq/L (ref 19–32)
Calcium: 9.9 mg/dL (ref 8.4–10.5)
Chloride: 101 meq/L (ref 96–112)
Creatinine, Ser: 0.84 mg/dL (ref 0.40–1.20)
GFR: 73.26 mL/min
Glucose, Bld: 126 mg/dL — ABNORMAL HIGH (ref 70–99)
Potassium: 4.2 meq/L (ref 3.5–5.1)
Sodium: 137 meq/L (ref 135–145)

## 2024-07-02 NOTE — Addendum Note (Signed)
 Addended by: NORLEEN LYNWOOD ORN on: 07/02/2024 03:15 PM   Modules accepted: Orders

## 2024-07-02 NOTE — Telephone Encounter (Signed)
Called and notified pt, pt verbalized understanding  

## 2024-07-02 NOTE — Telephone Encounter (Signed)
 Ok to let pt know -   She does have very large gallbladder stone which is likely causing the pain, but the actual gallbladder did not seem inflamed  We should refer to General Surgury asap - I will do the order, hopefully she will be seen soon

## 2024-07-02 NOTE — Telephone Encounter (Signed)
 CRITICAL VALUE STICKER  CRITICAL VALUE: US  shows 2.5 cm gallbladder neck stone, study is equivocal to acute cholecystitis  *result is in chart*  RECEIVER (on-site recipient of call): Chiquita  DATE & TIME NOTIFIED: 07/02/24 at 12:12 pm  MESSENGER (representative from lab): stacy from [facility] [facility]  MD NOTIFIED: Dr. Norleen

## 2024-07-10 ENCOUNTER — Other Ambulatory Visit: Payer: Self-pay | Admitting: General Surgery

## 2024-07-10 ENCOUNTER — Encounter (HOSPITAL_COMMUNITY): Payer: Self-pay | Admitting: General Surgery

## 2024-07-10 ENCOUNTER — Other Ambulatory Visit: Payer: Self-pay

## 2024-07-10 NOTE — Progress Notes (Signed)
 Left msg informing patient of new surgery time at 1237 and asked to arrive by 1000. Also instructed to stop all clear liquids by 0930.

## 2024-07-10 NOTE — Progress Notes (Signed)
 PCP - Norleen Lynwood ORN, MD  Cardiologist -   PPM/ICD - denies Device Orders - n.a Rep Notified - n/a  Chest x-ray - denies EKG - DOS Stress Test - denies ECHO - denies Cardiac Cath - denies  CPAP - denies  Dm -denies  Blood Thinner Instructions: denies Aspirin Instructions: denies  ERAS Protcol - clear liquids until 10:40 am.  COVID TEST- n/a  Anesthesia review: no  Patient verbally denies any shortness of breath, fever, cough and chest pain during phone call   -------------  SDW INSTRUCTIONS given:  Your procedure is scheduled on July 11, 2024.  Report to [facility] Main Entrance A at 11:10 A.M., and check in at the Admitting office.  Call this number if you have problems the morning of surgery:  [phone]   Remember:  Do not eat after midnight the night before your surgery  You may drink clear liquids until 10:40 the morning of your surgery.   Clear liquids allowed are: Water, Non-Citrus Juices (without pulp), Carbonated Beverages, Clear Tea, Black Coffee Only, and Gatorade    Take these medicines the morning of surgery with A SIP OF WATER  diazepam  (VALIUM )  gabapentin  (NEURONTIN )  levalbuterol (XOPENEX HFA)  Omega-3 Krill Oil  ondansetron  (ZOFRAN -ODT)   As of today, STOP taking any Aspirin (unless otherwise instructed by your surgeon) Aleve , Naproxen , Ibuprofen, Motrin, Advil, Goody's, BC's, all herbal medications, fish oil, and all vitamins.                      Do not wear jewelry, make up, or nail polish            Do not wear lotions, powders, perfumes/colognes, or deodorant.            Do not shave 48 hours prior to surgery.  Men may shave face and neck.            Do not bring valuables to the hospital.            [facility] is not responsible for any belongings or valuables.  Do NOT Smoke (Tobacco/Vaping) 24 hours prior to your procedure If you use a CPAP at night, you may bring all equipment for your overnight stay.   Contacts, glasses,  dentures or bridgework may not be worn into surgery.      For patients admitted to the hospital, discharge time will be determined by your treatment team.   Patients discharged the day of surgery will not be allowed to drive home, and someone needs to stay with them for 24 hours.    Special instructions:   [place]- Preparing For Surgery  Before surgery, you can play an important role. Because skin is not sterile, your skin needs to be as free of germs as possible. You can reduce the number of germs on your skin by washing with CHG (chlorahexidine gluconate) Soap before surgery.  CHG is an antiseptic cleaner which kills germs and bonds with the skin to continue killing germs even after washing.    Oral Hygiene is also important to reduce your risk of infection.  Remember - BRUSH YOUR TEETH THE MORNING OF SURGERY WITH YOUR REGULAR TOOTHPASTE  Please do not use if you have an allergy to CHG or antibacterial soaps. If your skin becomes reddened/irritated stop using the CHG.  Do not shave (including legs and underarms) for at least 48 hours prior to first CHG shower. It is OK to shave your face.  Please follow these instructions carefully.   Shower the NIGHT BEFORE SURGERY and the MORNING OF SURGERY with DIAL Soap.   Pat yourself dry with a CLEAN TOWEL.  Wear CLEAN PAJAMAS to bed the night before surgery  Place CLEAN SHEETS on your bed the night of your first shower and DO NOT SLEEP WITH PETS.   Day of Surgery: Please shower morning of surgery  Wear Clean/Comfortable clothing the morning of surgery Do not apply any deodorants/lotions.   Remember to brush your teeth WITH YOUR REGULAR TOOTHPASTE.   Questions were answered. Patient verbalized understanding of instructions.

## 2024-07-10 NOTE — H&P (Signed)
 " 64 year old female who presents with right upper quadrant pain.  She has had about 4 episodes over the last 4 months.  These all been right upper quadrant pain associated with nausea and vomiting.  This episode she has had has been going on for a little while now.  She currently has some significant right upper quadrant pain and is very uncomfortable in this visit.  She is not eating much right now at all.  She had labs done about a week ago that were all negative.  She had an ultrasound done on the 13th that showed a 2.3 mm gallbladder wall with a 2.5 cm stone in the gallbladder neck.  She had a positive sonographic Murphy sign and her bile duct measured 3.3 mm.    Review of Systems: A complete review of systems was obtained from the patient.  I have reviewed this information and discussed as appropriate with the patient.  See HPI as well for other ROS.   Review of Systems  Constitutional:  Positive for chills.  Gastrointestinal:  Positive for abdominal pain and nausea.  All other systems reviewed and are negative.     Medical History: Past Medical History      Past Medical History:  Diagnosis Date   Anxiety     Arthritis     Hypertension          Past Surgical History       Past Surgical History:  Procedure Laterality Date   MASTECTOMY       REPEAT CESAREAN SECTION            Allergies       Allergies  Allergen Reactions   Haloperidol Lactate Other (See Comments)      Lock Jaw   Citalopram  Other (See Comments)      dizzy        Medications Ordered Prior to Encounter        Current Outpatient Medications on File Prior to Visit  Medication Sig Dispense Refill   diazePAM  (VALIUM ) 5 MG tablet Take 5 mg by mouth once daily as needed       gabapentin  (NEURONTIN ) 300 MG capsule Take 300 mg by mouth 3 (three) times daily       losartan  (COZAAR ) 100 MG tablet Take 100 mg by mouth once daily       zolpidem  (AMBIEN ) 10 mg tablet Take 10 mg by mouth at bedtime as needed for  Sleep       omega 3-dha-epa-fish oil 476-800 mg Cap          No current facility-administered medications on file prior to visit.        Family History       Family History  Problem Relation Age of Onset   Breast cancer Mother     Stroke Sister          Tobacco Use History  Social History        Tobacco Use  Smoking Status Former   Types: Cigarettes  Smokeless Tobacco Never        Social History  Social History         Socioeconomic History   Marital status: Married  Tobacco Use   Smoking status: Former      Types: Cigarettes   Smokeless tobacco: Never  Substance and Sexual Activity   Drug use: Never     Stress: Stress Concern Present (07/01/2024)  Objective:       Vitals:    07/10/24 0846  BP: (!) 164/100  Pulse: 100  Temp: 37.2 C (99 F)  SpO2: 98%  Weight: 95.7 kg (211 lb)  Height: 180.3 cm (5' 11)    Body mass index is 29.43 kg/m.   Physical Exam Vitals reviewed.  Constitutional:      Appearance: Normal appearance.  Eyes:     General: No scleral icterus. Abdominal:     General: There is no distension.     Palpations: Abdomen is soft.     Tenderness: There is abdominal tenderness in the right upper quadrant. Positive signs include Murphy's sign.     Hernia: No hernia is present.  Neurological:     Mental Status: She is alert.         Assessment and Plan:    Assessment Diagnoses and all orders for this visit:   Cholecystitis   Other orders -     amoxicillin -clavulanate (AUGMENTIN ) 875-125 mg tablet; Take 1 tablet (875 mg total) by mouth every 12 (twelve) hours for 2 days     She needs a laparoscopic cholecystectomy   I discussed with her that I would admit her today and put her on antibiotics to see if one of my partners could do this today.  She does not want to do this.  I am going to give her some oral antibiotics and I will plan on doing her surgery  tomorrow and we are working on scheduling that. I discussed the procedure in detail.  We discussed the risks and benefits of a laparoscopic cholecystectomy and possible cholangiogram including, but not limited to bleeding, infection, injury to surrounding structures such as the intestine or liver, bile leak, retained gallstones, need to convert to an open procedure, prolonged diarrhea, blood clots such as  DVT, common bile duct injury, anesthesia risks, and possible need for additional procedures. We also discussed small risk of subtotal cholecystectomy.  The likelihood of improvement in symptoms and return to the patient's normal status is good. We discussed the typical post-operative recovery course.   "

## 2024-07-11 ENCOUNTER — Encounter (HOSPITAL_COMMUNITY): Payer: Self-pay | Admitting: General Surgery

## 2024-07-11 ENCOUNTER — Ambulatory Visit (HOSPITAL_COMMUNITY): Payer: Self-pay | Admitting: Anesthesiology

## 2024-07-11 ENCOUNTER — Ambulatory Visit (HOSPITAL_COMMUNITY)
Admission: RE | Admit: 2024-07-11 | Discharge: 2024-07-11 | Disposition: A | Discharge status: Home / Self Care | Attending: General Surgery | Admitting: General Surgery

## 2024-07-11 ENCOUNTER — Encounter (HOSPITAL_COMMUNITY): Admission: RE | Disposition: A | Payer: Self-pay | Source: Home / Self Care | Attending: General Surgery

## 2024-07-11 DIAGNOSIS — M1990 Unspecified osteoarthritis, unspecified site: Secondary | ICD-10-CM | POA: Diagnosis not present

## 2024-07-11 DIAGNOSIS — Z87891 Personal history of nicotine dependence: Secondary | ICD-10-CM | POA: Insufficient documentation

## 2024-07-11 DIAGNOSIS — K8010 Calculus of gallbladder with chronic cholecystitis without obstruction: Secondary | ICD-10-CM | POA: Insufficient documentation

## 2024-07-11 DIAGNOSIS — I10 Essential (primary) hypertension: Secondary | ICD-10-CM | POA: Diagnosis not present

## 2024-07-11 DIAGNOSIS — J45909 Unspecified asthma, uncomplicated: Secondary | ICD-10-CM | POA: Diagnosis not present

## 2024-07-11 DIAGNOSIS — K429 Umbilical hernia without obstruction or gangrene: Secondary | ICD-10-CM | POA: Insufficient documentation

## 2024-07-11 HISTORY — DX: Unspecified osteoarthritis, unspecified site: M19.90

## 2024-07-11 MED ORDER — ROPIVACAINE HCL 5 MG/ML IJ SOLN
INTRAMUSCULAR | Status: DC | PRN
  Administered 2024-07-11: 30 mL via PERINEURAL

## 2024-07-11 MED ORDER — OXYCODONE HCL 5 MG PO TABS
ORAL_TABLET | ORAL | Status: AC
  Filled 2024-07-11: qty 1

## 2024-07-11 MED ORDER — OXYCODONE HCL 5 MG PO TABS
5.0000 mg | ORAL_TABLET | ORAL | Status: DC | PRN
  Administered 2024-07-11: 5 mg via ORAL

## 2024-07-11 MED ORDER — LIDOCAINE 2% (20 MG/ML) 5 ML SYRINGE
INTRAMUSCULAR | Status: DC | PRN
  Administered 2024-07-11: 100 mg via INTRAVENOUS

## 2024-07-11 MED ORDER — DEXAMETHASONE SOD PHOSPHATE PF 10 MG/ML IJ SOLN
INTRAMUSCULAR | Status: AC
  Filled 2024-07-11: qty 1

## 2024-07-11 MED ORDER — ROCURONIUM BROMIDE 10 MG/ML (PF) SYRINGE
PREFILLED_SYRINGE | INTRAVENOUS | Status: DC | PRN
  Administered 2024-07-11: 60 mg via INTRAVENOUS

## 2024-07-11 MED ORDER — SUGAMMADEX SODIUM 200 MG/2ML IV SOLN
INTRAVENOUS | Status: DC | PRN
  Administered 2024-07-11: 187.8 mg via INTRAVENOUS

## 2024-07-11 MED ORDER — LIDOCAINE 2% (20 MG/ML) 5 ML SYRINGE
INTRAMUSCULAR | Status: AC
  Filled 2024-07-11: qty 5

## 2024-07-11 MED ORDER — CHLORHEXIDINE GLUCONATE CLOTH 2 % EX PADS: 6.0000 | MEDICATED_PAD | Freq: Once | CUTANEOUS | Status: DC

## 2024-07-11 MED ORDER — DEXAMETHASONE SOD PHOSPHATE PF 10 MG/ML IJ SOLN
INTRAMUSCULAR | Status: DC | PRN
  Administered 2024-07-11: 10 mg via INTRAVENOUS

## 2024-07-11 MED ORDER — SODIUM CHLORIDE 0.9 % IV SOLN: 250.0000 mL | INTRAVENOUS | Status: DC | PRN

## 2024-07-11 MED ORDER — ACETAMINOPHEN 325 MG PO TABS: 650.0000 mg | ORAL_TABLET | ORAL | Status: DC | PRN

## 2024-07-11 MED ORDER — ACETAMINOPHEN 650 MG RE SUPP: 650.0000 mg | RECTAL | Status: DC | PRN

## 2024-07-11 MED ORDER — SODIUM CHLORIDE 0.9% FLUSH: 3.0000 mL | INTRAVENOUS | Status: DC | PRN

## 2024-07-11 MED ORDER — HYDROMORPHONE HCL 1 MG/ML IJ SOLN
INTRAMUSCULAR | Status: DC | PRN
  Administered 2024-07-11: .5 mg via INTRAVENOUS

## 2024-07-11 MED ORDER — MIDAZOLAM HCL (PF) 2 MG/2ML IJ SOLN
INTRAMUSCULAR | Status: DC | PRN
  Administered 2024-07-11: 2 mg via INTRAVENOUS

## 2024-07-11 MED ORDER — OXYCODONE HCL 5 MG PO TABS: 5.0000 mg | ORAL_TABLET | Freq: Four times a day (QID) | ORAL | 0 refills | Status: AC | PRN

## 2024-07-11 MED ORDER — PROPOFOL 10 MG/ML IV BOLUS
INTRAVENOUS | Status: AC
  Filled 2024-07-11: qty 20

## 2024-07-11 MED ORDER — ONDANSETRON HCL 4 MG/2ML IJ SOLN
INTRAMUSCULAR | Status: DC | PRN
  Administered 2024-07-11: 4 mg via INTRAVENOUS

## 2024-07-11 MED ORDER — PROPOFOL 10 MG/ML IV BOLUS
INTRAVENOUS | Status: DC | PRN
  Administered 2024-07-11: 140 mg via INTRAVENOUS

## 2024-07-11 MED ORDER — FENTANYL CITRATE (PF) 100 MCG/2ML IJ SOLN
INTRAMUSCULAR | Status: AC
  Filled 2024-07-11: qty 2

## 2024-07-11 MED ORDER — SPY AGENT GREEN - (INDOCYANINE FOR INJECTION)
1.2500 mg | Freq: Once | INTRAMUSCULAR | Status: AC
  Administered 2024-07-11: 1.25 mg via INTRAVENOUS

## 2024-07-11 MED ORDER — CHLORHEXIDINE GLUCONATE 0.12 % MT SOLN
15.0000 mL | Freq: Once | OROMUCOSAL | Status: AC
  Administered 2024-07-11: 15 mL via OROMUCOSAL
  Filled 2024-07-11: qty 15

## 2024-07-11 MED ORDER — HYDROMORPHONE HCL 1 MG/ML IJ SOLN
INTRAMUSCULAR | Status: AC
  Filled 2024-07-11: qty 1

## 2024-07-11 MED ORDER — ONDANSETRON HCL 4 MG/2ML IJ SOLN
INTRAMUSCULAR | Status: AC
  Filled 2024-07-11: qty 2

## 2024-07-11 MED ORDER — ENSURE PRE-SURGERY PO LIQD: 296.0000 mL | Freq: Once | ORAL | Status: DC

## 2024-07-11 MED ORDER — PHENYLEPHRINE 80 MCG/ML (10ML) SYRINGE FOR IV PUSH (FOR BLOOD PRESSURE SUPPORT)
PREFILLED_SYRINGE | INTRAVENOUS | Status: DC | PRN
  Administered 2024-07-11: 160 ug via INTRAVENOUS

## 2024-07-11 MED ORDER — HYDROMORPHONE HCL 1 MG/ML IJ SOLN
0.2500 mg | INTRAMUSCULAR | Status: DC | PRN
  Administered 2024-07-11 (×2): 0.25 mg via INTRAVENOUS
  Administered 2024-07-11 (×3): 0.5 mg via INTRAVENOUS

## 2024-07-11 MED ORDER — BUPIVACAINE-EPINEPHRINE 0.25% -1:200000 IJ SOLN
INTRAMUSCULAR | Status: DC | PRN
  Administered 2024-07-11: 9 mL

## 2024-07-11 MED ORDER — SODIUM CHLORIDE 0.9% FLUSH: 3.0000 mL | Freq: Two times a day (BID) | INTRAVENOUS | Status: DC

## 2024-07-11 MED ORDER — MIDAZOLAM HCL 2 MG/2ML IJ SOLN
INTRAMUSCULAR | Status: AC
  Filled 2024-07-11: qty 2

## 2024-07-11 MED ORDER — AMISULPRIDE (ANTIEMETIC) 5 MG/2ML IV SOLN: 10.0000 mg | Freq: Once | INTRAVENOUS | Status: DC | PRN

## 2024-07-11 MED ORDER — FENTANYL CITRATE (PF) 100 MCG/2ML IJ SOLN
25.0000 ug | INTRAMUSCULAR | Status: DC | PRN
  Administered 2024-07-11 (×3): 50 ug via INTRAVENOUS

## 2024-07-11 MED ORDER — CELECOXIB 200 MG PO CAPS
200.0000 mg | ORAL_CAPSULE | Freq: Once | ORAL | Status: AC
  Administered 2024-07-11: 200 mg via ORAL
  Filled 2024-07-11: qty 1

## 2024-07-11 MED ORDER — KETOROLAC TROMETHAMINE 15 MG/ML IJ SOLN
INTRAMUSCULAR | Status: DC | PRN
  Administered 2024-07-11: 15 mg via INTRAVENOUS

## 2024-07-11 MED ORDER — ACETAMINOPHEN 500 MG PO TABS: 1000.0000 mg | ORAL_TABLET | Freq: Once | ORAL | Status: DC

## 2024-07-11 MED ORDER — ROCURONIUM BROMIDE 10 MG/ML (PF) SYRINGE
PREFILLED_SYRINGE | INTRAVENOUS | Status: AC
  Filled 2024-07-11: qty 10

## 2024-07-11 MED ORDER — HYDROMORPHONE HCL 1 MG/ML IJ SOLN
INTRAMUSCULAR | Status: AC
  Filled 2024-07-11: qty 0.5

## 2024-07-11 MED ORDER — CEFAZOLIN SODIUM-DEXTROSE 2-4 GM/100ML-% IV SOLN
2.0000 g | INTRAVENOUS | Status: AC
  Administered 2024-07-11: 2 g via INTRAVENOUS
  Filled 2024-07-11: qty 100

## 2024-07-11 MED ORDER — KETOROLAC TROMETHAMINE 30 MG/ML IJ SOLN
INTRAMUSCULAR | Status: AC
  Filled 2024-07-11: qty 1

## 2024-07-11 MED ORDER — ACETAMINOPHEN 500 MG PO TABS
1000.0000 mg | ORAL_TABLET | ORAL | Status: AC
  Administered 2024-07-11: 1000 mg via ORAL
  Filled 2024-07-11: qty 2

## 2024-07-11 MED ORDER — LACTATED RINGERS IV SOLN: INTRAVENOUS | Status: DC

## 2024-07-11 MED ORDER — ORAL CARE MOUTH RINSE: 15.0000 mL | Freq: Once | OROMUCOSAL | Status: AC

## 2024-07-11 NOTE — Transfer of Care (Signed)
 Immediate Anesthesia Transfer of Care Note  Patient: Terri Fletcher  Procedure(s) Performed: LAPAROSCOPIC CHOLECYSTECTOMY REPAIR, HERNIA, UMBILICAL, ADULT  Patient Location: PACU  Anesthesia Type:General  Level of Consciousness: awake and drowsy  Airway & Oxygen Therapy: Patient Spontanous Breathing  Post-op Assessment: Report given to RN, Post -op Vital signs reviewed and stable, and Patient moving all extremities  Post vital signs: Reviewed and stable  Last Vitals:  Vitals Value Taken Time  BP 187/87 07/11/24 13:52  Temp 36.5 C 07/11/24 13:52  Pulse 80 07/11/24 13:53  Resp 16 07/11/24 13:53  SpO2 98 % 07/11/24 13:53  Vitals shown include unfiled device data.  Last Pain:  Vitals:   07/11/24 1124  TempSrc:   PainSc: 0-No pain      Patients Stated Pain Goal: 1 (07/11/24 1118)  Complications: No notable events documented.

## 2024-07-11 NOTE — Discharge Instructions (Signed)
 [facility] LAPAROSCOPIC SURGERY: POST OP INSTRUCTIONS  Always review your discharge instruction sheet given to you by the facility where your surgery was performed. IF YOU HAVE DISABILITY OR FAMILY LEAVE FORMS, YOU MUST BRING THEM TO THE OFFICE FOR PROCESSING.   DO NOT GIVE THEM TO YOUR DOCTOR.  A prescription for pain medication may be given to you upon discharge.  Take your pain medication as prescribed, if needed.  If narcotic pain medicine is not needed, then you may take acetaminophen  (Tylenol ), naprosyn (Alleve), or ibuprofen  (Advil ) as needed. Take your usually prescribed medications unless otherwise directed. If you need a refill on your pain medication, please contact your pharmacy.  They will contact our office to request authorization. Prescriptions will not be filled after 5pm or on week-ends. You should follow a light diet the first few days after arrival home, such as soup and crackers, etc.  Be sure to include lots of fluids daily. Most patients will experience some swelling and bruising in the area of the incisions.  Ice packs will help.  Swelling and bruising can take several days to resolve.  It is common to experience some constipation if taking pain medication after surgery.  Increasing fluid intake and taking a stool softener (such as Colace) will usually help or prevent this problem from occurring.  A mild laxative (Milk of Magnesia or Miralax) should be taken according to package instructions if there are no bowel movements after 48 hours. Unless discharge instructions indicate otherwise, you may remove your bandages 48 hours after surgery, and you may shower at that time.  You may have steri-strips (small skin tapes) in place directly over the incision.  These strips should be left on the skin for 7-10 days.  If your surgeon used skin glue on the incision, you may shower in 24 hours.  The glue will flake off over the next  2-3 weeks.  Any sutures or staples will be removed at the office during your follow-up visit. ACTIVITIES:  You may resume regular (light) daily activities beginning the next day--such as daily self-care, walking, climbing stairs--gradually increasing activities as tolerated.  You may have sexual intercourse when it is comfortable.  Refrain from any heavy lifting or straining until approved by your doctor. You may drive when you are no longer taking prescription pain medication, you can comfortably wear a seatbelt, and you can safely maneuver your car and apply brakes. RETURN TO WORK:  __________________________________________________________ Terri Fletcher should see your doctor in the office for a follow-up appointment approximately 2-3 weeks after your surgery.  Make sure that you call for this appointment within a day or two after you arrive home to insure a convenient appointment time. OTHER INSTRUCTIONS: __________________________________________________________________________________________________________________________ __________________________________________________________________________________________________________________________ WHEN TO CALL YOUR DOCTOR: Fever over 101.0 Inability to urinate Continued bleeding from incision. Increased pain, redness, or drainage from the incision. Increasing abdominal pain  The clinic staff is available to answer your questions during regular business hours.  Please don't hesitate to call and ask to speak to one of the nurses for clinical concerns.  If you have a medical emergency, go to the nearest emergency room or call 911.  A surgeon from [facility] is always on call at the hospital. [address], [address], [place], [place]  [address] ? [address] [address], [place], [place]   [address] [phone] ? [phone] ? FAX [phone] Web site: [email or web address]

## 2024-07-11 NOTE — Anesthesia Preprocedure Evaluation (Signed)
"                                    Anesthesia Evaluation  Patient identified by MRN, date of birth, ID band Patient awake    Reviewed: Allergy & Precautions, NPO status , Patient's Chart, lab work & pertinent test results  Airway Mallampati: II  TM Distance: >3 FB Neck ROM: Full    Dental no notable dental hx.    Pulmonary asthma , former smoker   Pulmonary exam normal        Cardiovascular hypertension, Pt. on medications  Rhythm:Regular Rate:Normal     Neuro/Psych  Headaches  Anxiety        GI/Hepatic Neg liver ROS,,,Cholecystitis    Endo/Other  negative endocrine ROS    Renal/GU negative Renal ROS  negative genitourinary   Musculoskeletal  (+) Arthritis , Osteoarthritis,    Abdominal Normal abdominal exam  (+)   Peds  Hematology   Anesthesia Other Findings   Reproductive/Obstetrics                              Anesthesia Physical Anesthesia Plan  ASA: 2  Anesthesia Plan: General and Regional   Post-op Pain Management: Regional block*   Induction: Intravenous  PONV Risk Score and Plan: 3 and Ondansetron , Dexamethasone , Midazolam  and Treatment may vary due to age or medical condition  Airway Management Planned: Mask and Oral ETT  Additional Equipment: None  Intra-op Plan:   Post-operative Plan: Extubation in OR  Informed Consent: I have reviewed the patients History and Physical, chart, labs and discussed the procedure including the risks, benefits and alternatives for the proposed anesthesia with the patient or authorized representative who has indicated his/her understanding and acceptance.     Dental advisory given  Plan Discussed with: CRNA  Anesthesia Plan Comments:         Anesthesia Quick Evaluation  "

## 2024-07-11 NOTE — Op Note (Signed)
 Preoperative diagnosis acute cholecystitis Postoperative diagnosis: Acute on chronic cholecystitis Procedure: Laparoscopic cholecystectomy, primary umbilical hernia repair Surgeon: Dr. Adina Bury Anesthesia: General With bilateral tap blocks Estimated blood loss: Minimal Specimens: Gallbladder and contents to pathology Complications: None Drains: None Sponge needle count was correct completion Disposition recovery stable condition  Indications: This is a 64 year old female who presents with a number of episodes of intermittent right upper quadrant pain that have become consistent at least for the last 10 days.  She underwent evaluation a week ago that showed negative labs and had an ultrasound done that showed a 2.3 mm gallbladder wall with a 2.5 cm stone in the neck with a positive Murphy sign.  I saw her yesterday in the office and discussed proceeding with laparoscopic cholecystectomy today.  Procedure: After informed consent was obtained she was taken to the op room.  She was given antibiotics as well as into signing green dye.  She had SCDs in place.  She was then placed under general anesthesia without complication.  She was prepped and draped in a standard sterile surgical fashion.  Surgical timeout was then performed.  Due to the fact that she had an umbilical hernia I made a critical incision below the umbilicus.  I dissected the umbilical stalk which I divided.  She had about a 8 mm hernia.  I used this hernia as my entry site into the abdomen.  I then placed a Hassan trocar and insufflated the abdomen to 15-minute 15 mmHg pressure.  I then placed 3 additional trocars in epigastrium and right upper quadrant.  Her duodenum and her omentum were adhered to her gallbladder and these were taken down with a combination of blunt and sharp dissection.  I then retracted her gallbladder cephalad and lateral.  She had pretty significant chronic cholecystitis as well as an acute infection.  I was  able to dissect the triangle and very clearly obtain the critical view of safety.  I saw both the cystic artery and cystic duct entering the gallbladder.  I took the gallbladder off the liver bed most of the way to confirm this.  I used the infrared setting on the camera to confirm with the ICG dye that my common duct was well away.  I was able to visualize the entire common duct going down to with the duodenum showing that it had dye in it as well.  The cystic duct was also visible.  I then clipped the artery 3 times and divided it.  This was the end.  The anterior and the posterior branch treated separately.  I then did the same for the duct.  The duct was viable and the  clips completely traversed the duct.  I then placed the gallbladder in a retrieval bag.  I removed this from the abdomen.  I had to enlarge the incision just to be able to remove it due to the size of the stones.  I then obtained hemostasis.  I then closed down the pursestring that I had used to secure the trocar.  I used two #1 Novafil's to then completely closed the umbilical defect and repair of the hernia using the suture passer device.  I then desufflated the abdomen to remove the remaining trocars.  I tacked the umbilicus down with a 3-0 Vicryl.  This close was closed with 3-0 Vicryl and 4-0 monocryl on these.  She tolerated this well was extubated and transferred recovery stable.

## 2024-07-11 NOTE — Anesthesia Procedure Notes (Signed)
 Anesthesia Regional Block: TAP block   Pre-Anesthetic Checklist: , timeout performed,  Correct Patient, Correct Site, Correct Laterality,  Correct Procedure, Correct Position, site marked,  Risks and benefits discussed,  Surgical consent,  Pre-op evaluation,  At surgeon's request and post-op pain management  Laterality: Right and Left  Prep: Maximum Sterile Barrier Precautions used, chloraprep       Needles:  Injection technique: Single-shot  Needle Type: Echogenic Needle      Needle Gauge: 20     Additional Needles:   Procedures:,,,, ultrasound used (permanent image in chart),,    Narrative:  Start time: 07/11/2024 12:40 PM End time: 07/11/2024 12:45 PM Injection made incrementally with aspirations every 5 mL.  Performed by: Personally  Anesthesiologist: Keneth Lynwood POUR, MD

## 2024-07-11 NOTE — Anesthesia Procedure Notes (Signed)
 Procedure Name: Intubation Date/Time: 07/11/2024 12:39 PM  Performed by: Jerl Donald LABOR, CRNAPre-anesthesia Checklist: Patient identified, Emergency Drugs available, Suction available and Patient being monitored Patient Re-evaluated:Patient Re-evaluated prior to induction Oxygen Delivery Method: Circle System Utilized Preoxygenation: Pre-oxygenation with 100% oxygen Induction Type: IV induction Ventilation: Mask ventilation without difficulty Laryngoscope Size: Mac and 3 Grade View: Grade I Tube type: Oral Tube size: 7.0 mm Number of attempts: 1 Airway Equipment and Method: Stylet and Oral airway Placement Confirmation: ETT inserted through vocal cords under direct vision, positive ETCO2 and breath sounds checked- equal and bilateral Secured at: 21 cm Tube secured with: Tape Dental Injury: Teeth and Oropharynx as per pre-operative assessment

## 2024-07-11 NOTE — Interval H&P Note (Signed)
 History and Physical Interval Note:  07/11/2024 11:36 AM  Terri Fletcher  has presented today for surgery, with the diagnosis of CHOLECYSTITIS.  The various methods of treatment have been discussed with the patient and family. After consideration of risks, benefits and other options for treatment, the patient has consented to  Procedures with comments: LAPAROSCOPIC CHOLECYSTECTOMY (N/A) - LAPAROSCOPIC CHOLECYSTECTOMY WITH ICG DYE GEN TAP BLOCK as a surgical intervention.  The patient's history has been reviewed, patient examined, no change in status, stable for surgery.  I have reviewed the patient's chart and labs.  Questions were answered to the patient's satisfaction.     Donnice Bury

## 2024-07-12 ENCOUNTER — Encounter (HOSPITAL_COMMUNITY): Payer: Self-pay | Admitting: General Surgery

## 2024-07-12 NOTE — Addendum Note (Signed)
 Addendum  created 07/12/24 1128 by Torey Regan, Cordella SQUIBB, DO   Attestation recorded in Intraprocedure, Intraprocedure Attestations filed, Optician, Dispensing edited

## 2024-07-12 NOTE — Anesthesia Postprocedure Evaluation (Signed)
"   Anesthesia Post Note  Patient: Temisha Murley  Procedure(s) Performed: LAPAROSCOPIC CHOLECYSTECTOMY REPAIR, HERNIA, UMBILICAL, ADULT     Patient location during evaluation: PACU Anesthesia Type: Regional and General Level of consciousness: awake and alert Pain management: pain level controlled Vital Signs Assessment: post-procedure vital signs reviewed and stable Respiratory status: spontaneous breathing, nonlabored ventilation, respiratory function stable and patient connected to nasal cannula oxygen Cardiovascular status: blood pressure returned to baseline and stable Postop Assessment: no apparent nausea or vomiting Anesthetic complications: no   No notable events documented.  Last Vitals:  Vitals:   07/11/24 1445 07/11/24 1500  BP: (!) 151/71 (!) 146/83  Pulse: 63 65  Resp: 17 20  Temp:  36.4 C  SpO2: 100% 97%    Last Pain:  Vitals:   07/11/24 1445  TempSrc:   PainSc: 6                  Lynwood MARLA Cornea      "

## 2024-07-15 LAB — SURGICAL PATHOLOGY

## 2024-07-29 ENCOUNTER — Encounter

## 2024-08-13 ENCOUNTER — Encounter

## 2024-09-04 ENCOUNTER — Ambulatory Visit: Admitting: Internal Medicine
